# Patient Record
Sex: Female | Born: 1985
Health system: Southern US, Community
[De-identification: ages and names within clinical notes are randomized; demographics above are authoritative.]

## PROBLEM LIST (undated history)

## (undated) ENCOUNTER — Ambulatory Visit (HOSPITAL_COMMUNITY): Payer: Self-pay

## (undated) DIAGNOSIS — F419 Anxiety disorder, unspecified: Secondary | ICD-10-CM

## (undated) DIAGNOSIS — F329 Major depressive disorder, single episode, unspecified: Secondary | ICD-10-CM

## (undated) DIAGNOSIS — F32A Depression, unspecified: Secondary | ICD-10-CM

## (undated) DIAGNOSIS — F209 Schizophrenia, unspecified: Secondary | ICD-10-CM

## (undated) HISTORY — PX: CHOLECYSTECTOMY: SHX55

---
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
## 2005-02-14 ENCOUNTER — Ambulatory Visit (HOSPITAL_COMMUNITY): Admission: RE | Admit: 2005-02-14 | Discharge: 2005-02-14 | Payer: Self-pay | Admitting: Pulmonary Disease

## 2005-04-20 ENCOUNTER — Ambulatory Visit (HOSPITAL_COMMUNITY): Admission: RE | Admit: 2005-04-20 | Discharge: 2005-04-20 | Payer: Self-pay | Admitting: Pulmonary Disease

## 2006-08-13 ENCOUNTER — Emergency Department (HOSPITAL_COMMUNITY): Admission: EM | Admit: 2006-08-13 | Discharge: 2006-08-14 | Payer: Self-pay | Admitting: Emergency Medicine

## 2006-11-17 ENCOUNTER — Emergency Department (HOSPITAL_COMMUNITY): Admission: EM | Admit: 2006-11-17 | Discharge: 2006-11-17 | Payer: Self-pay | Admitting: Emergency Medicine

## 2006-12-05 ENCOUNTER — Inpatient Hospital Stay (HOSPITAL_COMMUNITY): Admission: EM | Admit: 2006-12-05 | Discharge: 2006-12-07 | Payer: Self-pay | Admitting: Emergency Medicine

## 2007-04-02 ENCOUNTER — Ambulatory Visit (HOSPITAL_COMMUNITY): Admission: RE | Admit: 2007-04-02 | Discharge: 2007-04-02 | Payer: Self-pay | Admitting: Pulmonary Disease

## 2007-04-03 ENCOUNTER — Ambulatory Visit (HOSPITAL_COMMUNITY): Admission: RE | Admit: 2007-04-03 | Discharge: 2007-04-03 | Payer: Self-pay | Admitting: Pulmonary Disease

## 2010-10-16 ENCOUNTER — Encounter: Payer: Self-pay | Admitting: Pulmonary Disease
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
## 2015-10-21 ENCOUNTER — Emergency Department (HOSPITAL_COMMUNITY): Payer: Self-pay

## 2015-10-21 ENCOUNTER — Encounter (HOSPITAL_COMMUNITY): Payer: Self-pay | Admitting: Emergency Medicine

## 2015-10-21 ENCOUNTER — Encounter (HOSPITAL_BASED_OUTPATIENT_CLINIC_OR_DEPARTMENT_OTHER): Payer: Self-pay | Admitting: Clinical

## 2015-10-21 ENCOUNTER — Emergency Department (HOSPITAL_COMMUNITY)
Admission: EM | Admit: 2015-10-21 | Discharge: 2015-10-21 | Disposition: A | Discharge status: Home / Self Care | Payer: Self-pay | Attending: Emergency Medicine | Admitting: Emergency Medicine

## 2015-10-21 DIAGNOSIS — R000 Tachycardia, unspecified: Secondary | ICD-10-CM | POA: Insufficient documentation

## 2015-10-21 DIAGNOSIS — S50811A Abrasion of right forearm, initial encounter: Secondary | ICD-10-CM | POA: Insufficient documentation

## 2015-10-21 DIAGNOSIS — Z8659 Personal history of other mental and behavioral disorders: Secondary | ICD-10-CM | POA: Insufficient documentation

## 2015-10-21 DIAGNOSIS — IMO0002 Reserved for concepts with insufficient information to code with codable children: Secondary | ICD-10-CM

## 2015-10-21 DIAGNOSIS — M546 Pain in thoracic spine: Secondary | ICD-10-CM

## 2015-10-21 DIAGNOSIS — Y9289 Other specified places as the place of occurrence of the external cause: Secondary | ICD-10-CM | POA: Insufficient documentation

## 2015-10-21 DIAGNOSIS — Y9389 Activity, other specified: Secondary | ICD-10-CM | POA: Insufficient documentation

## 2015-10-21 DIAGNOSIS — F17200 Nicotine dependence, unspecified, uncomplicated: Secondary | ICD-10-CM | POA: Insufficient documentation

## 2015-10-21 DIAGNOSIS — S29002A Unspecified injury of muscle and tendon of back wall of thorax, initial encounter: Secondary | ICD-10-CM | POA: Insufficient documentation

## 2015-10-21 DIAGNOSIS — Y998 Other external cause status: Secondary | ICD-10-CM | POA: Insufficient documentation

## 2015-10-21 DIAGNOSIS — Z7289 Other problems related to lifestyle: Secondary | ICD-10-CM

## 2015-10-21 DIAGNOSIS — S29001A Unspecified injury of muscle and tendon of front wall of thorax, initial encounter: Secondary | ICD-10-CM | POA: Insufficient documentation

## 2015-10-21 DIAGNOSIS — R0789 Other chest pain: Secondary | ICD-10-CM

## 2015-10-21 DIAGNOSIS — X788XXA Intentional self-harm by other sharp object, initial encounter: Secondary | ICD-10-CM | POA: Insufficient documentation

## 2015-10-21 DIAGNOSIS — S50812A Abrasion of left forearm, initial encounter: Secondary | ICD-10-CM | POA: Insufficient documentation

## 2015-10-21 DIAGNOSIS — R4189 Other symptoms and signs involving cognitive functions and awareness: Secondary | ICD-10-CM

## 2015-10-21 HISTORY — DX: Depression, unspecified: F32.A

## 2015-10-21 HISTORY — DX: Major depressive disorder, single episode, unspecified: F32.9

## 2015-10-21 HISTORY — DX: Anxiety disorder, unspecified: F41.9

## 2015-10-21 LAB — I-STAT TROPONIN, ED
TROPONIN I, POC: 0 ng/mL (ref 0.00–0.08)
TROPONIN I, POC: 0 ng/mL (ref 0.00–0.08)

## 2015-10-21 LAB — CBC
HCT: 43.5 % (ref 36.0–46.0)
Hemoglobin: 15.1 g/dL — ABNORMAL HIGH (ref 12.0–15.0)
MCH: 31.2 pg (ref 26.0–34.0)
MCHC: 34.7 g/dL (ref 30.0–36.0)
MCV: 89.9 fL (ref 78.0–100.0)
PLATELETS: 271 10*3/uL (ref 150–400)
RBC: 4.84 MIL/uL (ref 3.87–5.11)
RDW: 13.4 % (ref 11.5–15.5)
WBC: 11.3 10*3/uL — AB (ref 4.0–10.5)

## 2015-10-21 LAB — BASIC METABOLIC PANEL
Anion gap: 12 (ref 5–15)
BUN: 8 mg/dL (ref 6–20)
CALCIUM: 9.2 mg/dL (ref 8.9–10.3)
CHLORIDE: 102 mmol/L (ref 101–111)
CO2: 23 mmol/L (ref 22–32)
CREATININE: 0.65 mg/dL (ref 0.44–1.00)
Glucose, Bld: 105 mg/dL — ABNORMAL HIGH (ref 65–99)
Potassium: 3.3 mmol/L — ABNORMAL LOW (ref 3.5–5.1)
SODIUM: 137 mmol/L (ref 135–145)

## 2015-10-21 LAB — ACETAMINOPHEN LEVEL

## 2015-10-21 LAB — SALICYLATE LEVEL: Salicylate Lvl: 4.4 mg/dL (ref 2.8–30.0)

## 2015-10-21 LAB — ETHANOL

## 2015-10-21 MED ORDER — IBUPROFEN 600 MG PO TABS: 600.0000 mg | ORAL_TABLET | Freq: Three times a day (TID) | ORAL | Status: DC | PRN

## 2015-10-21 MED ORDER — KETOROLAC TROMETHAMINE 30 MG/ML IJ SOLN
30.0000 mg | Freq: Once | INTRAMUSCULAR | Status: AC
  Administered 2015-10-21: 30 mg via INTRAVENOUS
  Filled 2015-10-21: qty 1

## 2015-10-21 MED ORDER — ACETAMINOPHEN 325 MG PO TABS
650.0000 mg | ORAL_TABLET | ORAL | Status: DC | PRN
  Administered 2015-10-21: 650 mg via ORAL
  Filled 2015-10-21: qty 2

## 2015-10-21 MED FILL — SULFAMETHOXAZOLE/TMP DS TAB: 800-160 | 7 days supply | Qty: 14 | Fill #0

## 2015-10-21 MED FILL — ?FLUOXETINE HCL 20MG TABLET: 20 | 30 days supply | Qty: 30 | Fill #0

## 2015-10-21 MED FILL — BENZTROPINE MES 1 MG TABLET: 1 | 15 days supply | Qty: 30 | Fill #0

## 2015-10-21 MED FILL — IBUPROFEN 600 MG TABLET: 600 | 10 days supply | Qty: 30 | Fill #0

## 2015-10-21 MED FILL — HALOPERIDOL 2 MG TABLET: 2 | 30 days supply | Qty: 30 | Fill #0
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
## 2015-10-21 NOTE — ED Notes (Signed)
Patient with chest pain that started over 2 hours ago.  Patient states that the pain goes into left arm and into her back.  Patient is CAOx4 at this time.  Patient denies any shortness of breath or nausea or vomiting.

## 2015-10-21 NOTE — ED Notes (Signed)
TTS at bedside. 

## 2015-10-21 NOTE — ED Notes (Signed)
PT returned from XR
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
## 2015-10-21 NOTE — ED Notes (Signed)
Patient d/c'd from IV, continuous pulse oximetry and blood pressure cuff; patient getting dressed to be discharged home 

## 2015-10-21 NOTE — Care Management Note (Signed)
Case Management Note  Patient Details  Name: Emily [name] MRN: [id number] Date of Birth: Sep 11, 1986  Subjective/Objective:                  30 year old female presents with left sided back pain that radiates to chest and left upper arm.//Home alone.  Action/Plan: Follow for disposition needs.   Expected Discharge Date:      10/11/15            Expected Discharge Plan:  Home/Self Care  In-House Referral:  NA  Discharge planning Services  CM Consult, Follow-up appt scheduled  Post Acute Care Choice:  NA Choice offered to:  Patient  DME Arranged:  N/A DME Agency:  NA  HH Arranged:  NA HH Agency:  NA  Status of Service:  Completed, signed off  Medicare Important Message Given:    Date Medicare IM Given:    Medicare IM give by:    Date Additional Medicare IM Given:    Additional Medicare Important Message give by:     If discussed at [place] [place] of Stay Meetings, dates discussed:    Additional Comments: Hawkin Charo J. Clydene Laming, RN, BSN, [place] [phone] ED CM consulted regarding PCP establishment and insurance enrollment. Pt presented to [facility] ED today with pain. NCM met with pt at bedside; pt confirms not having access to f/u care with PCP or insurance coverage. Discussed with patient importance and benefits of establishing PCP, and not utilizing the ED for primary care needs. Pt verbalized understanding and is in agreement. Discussed other options, provided list of local  affordable PCPs.  Pt voiced interest in the [facility] and [place].  NCM advised that [facility]  Internal Medicine providers are seeing pts at [facility]. Pt verbalized understanding. NCM set up appointment at [facility] with Pecolia Ades, NP on 2/27 at 0915.  NCM informed pt they may visit [place] and [facility] for Rx needs upon discharge.   Fuller Mandril, RN 10/21/2015, 10:32 AM

## 2015-10-21 NOTE — Progress Notes (Addendum)
ASSESSMENT: Pt currently experiencing impaired cognition of unknown origin. Pt needs to establish care with PCP, f/u with [facility], continue with upcoming BH evaluation/appointment; would benefit from community resources and supportive counseling regarding navigating healthcare and psychosocial stressors.  Stage of Change: precontemplative  PLAN: 1. F/U with behavioral health consultant in as needed  2. Psychiatric Medications: unknown. 3. Behavioral recommendation(s):   -Pick up medications today in [facility] pharmacy -Go to disability appointment on 10-28-15 at 8:30am, [facility], [address] [place], [place], [place] -Go to [facility] tomorrow, 10-22-15, as walk-in for initial intake -Use phone at [facility] to call Asher Muir at [facility] [phone], to let Asher Muir know she is safe -Take food and toiletry resources home today -Upcoming PCP appointment on 11-21-14 at [facility] 9:30am  SUBJECTIVE: Pt. referred by another patient for help navigating medical appointments Pt. reports the following symptoms/concerns: Pt states that she was sent to [facility] today from the ED, and that she is uncertain about upcoming appointments. Pt says she needs food, says "I need help because I'm crazy, had a nervous breakdown yesterday, says she thinks she missed an appointment yesterday for disability, and that she will have transportation later this afternoon, and is in stable housing for the evening, although she is currently homeless, does not have a phone, says she will call tomorrow 10-22-15 to let us know she has made it to the [facility]. Duration of problem: At least one day Severity: moderate  OBJECTIVE: Orientation & Cognition: Oriented x3. Thought processes normal and appropriate to situation. Mood: appropriate. Affect: inappropriate, slowed Appearance: appropriate Risk of harm to self or others: no known risk of harm to self or others today, no SI today Substance use: alcohol, tobacco Assessments  administered: none today  Diagnosis: Impaired Cognition CPT Code: R41.89 -------------------------------------------- Other(s) present in the room: female friend  Time spent with patient in exam room: 20 minutes

## 2015-11-22 ENCOUNTER — Ambulatory Visit: Payer: Self-pay | Admitting: Family Medicine

## 2016-05-27 ENCOUNTER — Emergency Department (HOSPITAL_COMMUNITY)
Admission: EM | Admit: 2016-05-27 | Discharge: 2016-05-27 | Disposition: A | Discharge status: Home / Self Care | Payer: Self-pay | Attending: Emergency Medicine | Admitting: Emergency Medicine

## 2016-05-27 ENCOUNTER — Encounter (HOSPITAL_COMMUNITY): Payer: Self-pay | Admitting: *Deleted

## 2016-05-27 DIAGNOSIS — F17200 Nicotine dependence, unspecified, uncomplicated: Secondary | ICD-10-CM | POA: Insufficient documentation

## 2016-05-27 DIAGNOSIS — Y939 Activity, unspecified: Secondary | ICD-10-CM | POA: Insufficient documentation

## 2016-05-27 DIAGNOSIS — Y999 Unspecified external cause status: Secondary | ICD-10-CM | POA: Insufficient documentation

## 2016-05-27 DIAGNOSIS — Y929 Unspecified place or not applicable: Secondary | ICD-10-CM | POA: Insufficient documentation

## 2016-05-27 DIAGNOSIS — W57XXXA Bitten or stung by nonvenomous insect and other nonvenomous arthropods, initial encounter: Secondary | ICD-10-CM | POA: Insufficient documentation

## 2016-05-27 DIAGNOSIS — S40262A Insect bite (nonvenomous) of left shoulder, initial encounter: Secondary | ICD-10-CM | POA: Insufficient documentation

## 2016-05-27 DIAGNOSIS — Z7982 Long term (current) use of aspirin: Secondary | ICD-10-CM | POA: Insufficient documentation

## 2016-05-27 MED ORDER — NAPROXEN 250 MG PO TABS
500.0000 mg | ORAL_TABLET | Freq: Once | ORAL | Status: AC
  Administered 2016-05-27: 500 mg via ORAL
  Filled 2016-05-27: qty 2

## 2016-05-27 MED ORDER — NAPROXEN 375 MG PO TABS: 375.0000 mg | ORAL_TABLET | Freq: Two times a day (BID) | ORAL | 0 refills | Status: DC

## 2016-05-27 NOTE — ED Triage Notes (Signed)
The pt reports that she was bitten by something on her lt shoulder lasy night  And today she reports thast she has pain in her flank area.  I do not see any bites on her flank .  She also reports that she is sob no sob at present

## 2016-05-27 NOTE — ED Notes (Signed)
Pt refused discharge medications, appeared very anxious to leave.

## 2016-05-27 NOTE — ED Provider Notes (Signed)
[facility]-EMERGENCY DEPT Provider Note   CSN: [id number] Arrival date & time: 05/27/16  2133     History   Chief Complaint Chief Complaint  Patient presents with  . Insect Bite    HPI Emily [name] is a 30 y.o. female.  The patient presents with complaint of pain from multiple insect bites to left shoulder last night. Today she feels a burning sensation without bites to legs bilaterally. No nausea, vomiting, weakness.    The history is provided by the patient. No language interpreter was used.    Past Medical History:  Diagnosis Date  . Anxiety   . Anxiety   . Depression     There are no active problems to display for this patient.   History reviewed. No pertinent surgical history.  OB History    No data available       Home Medications    Prior to Admission medications   Not on File    Family History No family history on file.  Social History Social History  Substance Use Topics  . Smoking status: Current Every Day Smoker  . Smokeless tobacco: Never Used  . Alcohol use Yes     Allergies   Review of patient's allergies indicates no known allergies.   Review of Systems Review of Systems  Constitutional: Negative for activity change, appetite change and diaphoresis.  Respiratory: Negative.  Negative for shortness of breath.   Gastrointestinal: Negative.  Negative for nausea.  Genitourinary: Negative.  Negative for dysuria.  Musculoskeletal:       See HPI.  Skin: Positive for rash.  Neurological: Negative.      Physical Exam Updated Vital Signs BP 127/82   Pulse (!) 127   Temp 98.9 F (37.2 C)   Resp 22   LMP 04/27/2016   SpO2 98%   Physical Exam  Constitutional: She is oriented to person, place, and time. She appears well-developed and well-nourished.  Neck: Normal range of motion.  Pulmonary/Chest: Effort normal.  Musculoskeletal: Normal range of motion. She exhibits no edema or tenderness.  No bites, redness or swelling of LE's.     Neurological: She is alert and oriented to person, place, and time.  Skin: Skin is warm and dry.  Multiple singular raised red areas to posterior left shoulder without pustules, blisters or drainage c/w benign insect bites.      ED Treatments / Results  Labs (all labs ordered are listed, but only abnormal results are displayed) Labs Reviewed - No data to display  EKG  EKG Interpretation None       Radiology No results found.  Procedures Procedures (including critical care time)  Medications Ordered in ED Medications - No data to display   Initial Impression / Assessment and Plan / ED Course  I have reviewed the triage vital signs and the nursing notes.  Pertinent labs & imaging results that were available during my care of the patient were reviewed by me and considered in my medical decision making (see chart for details).  Clinical Course    The patient presents concerned for insect bits to shoulder. She reports psychiatric history, treated with regular medications through Mental health. She denies instability, hallucinations, SI/HI or missed appointments for medications. Will treat insect bites with anti-inflammatory medications and provide referral for PCP.  Final Clinical Impressions(s) / ED Diagnoses   Final diagnoses:  None   1. Insect bites.  New Prescriptions New Prescriptions   No medications on file  Elpidio AnisShari Dyllan Hughett, PA-C 05/27/16 2251    Maia PlanJoshua G Long, MD 05/28/16 [id number]

## 2016-06-28 ENCOUNTER — Inpatient Hospital Stay
Admission: RE | Admit: 2016-06-28 | Discharge: 2016-06-29 | DRG: 897 | Disposition: A | Discharge status: Home / Self Care | Payer: No Typology Code available for payment source | Source: Intra-hospital | Attending: Psychiatry | Admitting: Psychiatry

## 2016-06-28 ENCOUNTER — Encounter: Payer: Self-pay | Admitting: Psychiatry

## 2016-06-28 DIAGNOSIS — F209 Schizophrenia, unspecified: Secondary | ICD-10-CM | POA: Diagnosis present

## 2016-06-28 DIAGNOSIS — R768 Other specified abnormal immunological findings in serum: Secondary | ICD-10-CM

## 2016-06-28 DIAGNOSIS — F1220 Cannabis dependence, uncomplicated: Secondary | ICD-10-CM | POA: Diagnosis present

## 2016-06-28 DIAGNOSIS — F419 Anxiety disorder, unspecified: Secondary | ICD-10-CM | POA: Diagnosis present

## 2016-06-28 DIAGNOSIS — G4700 Insomnia, unspecified: Secondary | ICD-10-CM | POA: Diagnosis present

## 2016-06-28 DIAGNOSIS — F1410 Cocaine abuse, uncomplicated: Secondary | ICD-10-CM | POA: Diagnosis present

## 2016-06-28 DIAGNOSIS — F15959 Other stimulant use, unspecified with stimulant-induced psychotic disorder, unspecified: Principal | ICD-10-CM | POA: Diagnosis present

## 2016-06-28 DIAGNOSIS — F17210 Nicotine dependence, cigarettes, uncomplicated: Secondary | ICD-10-CM | POA: Diagnosis present

## 2016-06-28 DIAGNOSIS — F29 Unspecified psychosis not due to a substance or known physiological condition: Secondary | ICD-10-CM | POA: Diagnosis not present

## 2016-06-28 DIAGNOSIS — Z818 Family history of other mental and behavioral disorders: Secondary | ICD-10-CM | POA: Diagnosis not present

## 2016-06-28 DIAGNOSIS — F1520 Other stimulant dependence, uncomplicated: Secondary | ICD-10-CM

## 2016-06-28 DIAGNOSIS — B1920 Unspecified viral hepatitis C without hepatic coma: Secondary | ICD-10-CM | POA: Diagnosis present

## 2016-06-28 DIAGNOSIS — F17200 Nicotine dependence, unspecified, uncomplicated: Secondary | ICD-10-CM

## 2016-06-28 LAB — RAPID HIV SCREEN (HIV 1/2 AB+AG)
HIV 1/2 Antibodies: NONREACTIVE
HIV-1 P24 ANTIGEN - HIV24: NONREACTIVE

## 2016-06-28 MED ORDER — OLANZAPINE 7.5 MG PO TABS
7.5000 mg | ORAL_TABLET | Freq: Every day | ORAL | Status: DC
Start: 2016-06-28 — End: 2016-06-29
  Administered 2016-06-28: 7.5 mg via ORAL
  Filled 2016-06-28: qty 1

## 2016-06-28 MED ORDER — MAGNESIUM HYDROXIDE 400 MG/5ML PO SUSP: 30.0000 mL | Freq: Every day | ORAL | Status: DC | PRN

## 2016-06-28 MED ORDER — ALUM & MAG HYDROXIDE-SIMETH 200-200-20 MG/5ML PO SUSP: 30.0000 mL | ORAL | Status: DC | PRN

## 2016-06-28 MED ORDER — TRAZODONE HCL 100 MG PO TABS: 100.0000 mg | ORAL_TABLET | Freq: Every evening | ORAL | Status: DC | PRN

## 2016-06-28 MED ORDER — NICOTINE 21 MG/24HR TD PT24
21.0000 mg | MEDICATED_PATCH | Freq: Every day | TRANSDERMAL | Status: DC
  Administered 2016-06-28 – 2016-06-29 (×2): 21 mg via TRANSDERMAL
  Filled 2016-06-28 (×2): qty 1

## 2016-06-28 MED ORDER — ACETAMINOPHEN 325 MG PO TABS
650.0000 mg | ORAL_TABLET | Freq: Four times a day (QID) | ORAL | Status: DC | PRN
  Administered 2016-06-28 – 2016-06-29 (×3): 650 mg via ORAL
  Filled 2016-06-28 (×3): qty 2

## 2016-06-28 MED ORDER — HYDROXYZINE HCL 50 MG PO TABS
50.0000 mg | ORAL_TABLET | Freq: Three times a day (TID) | ORAL | Status: DC | PRN
  Administered 2016-06-29: 50 mg via ORAL
  Filled 2016-06-28: qty 1

## 2016-06-28 NOTE — BH Assessment (Signed)
[facility].  Accepting physician is Dr. Michel SanteeHernadez.  Attending Physician will be Dr. Michel SanteeHernadez.  Patient has been assigned to room 325, by [facility] Charge Nurse OpalJennifer.  Call report to [phone].  Representative/Transfer Coordinator is Jerilynn SomCalvin  [facility] Staff Lillia Abed([place], [facility]) made aware of acceptance.

## 2016-06-28 NOTE — Tx Team (Signed)
Initial Treatment Plan 06/28/2016 2:09 PM Emily FatLaken B Vanwagner UJW:[id number]    PATIENT STRESSORS: Financial difficulties Health problems/   PATIENT STRENGTHS: Communication skills Motivation for treatment/growth Physical Health   PATIENT IDENTIFIED PROBLEMS: Schizophrenia  06/28/16  Substance Abuse  06/28/16  Suicidal 06/28/16  Depression 06/28/16               DISCHARGE CRITERIA:  Ability to meet basic life and health needs Medical problems require only outpatient monitoring Withdrawal symptoms are absent or subacute and managed without 24-hour nursing intervention  PRELIMINARY DISCHARGE PLAN: Outpatient therapy Return to previous living arrangement  PATIENT/FAMILY INVOLVEMENT: This treatment plan has been presented to and reviewed with the patient, Emily [name], and/or family member,  The patient and family have been given the opportunity to ask questions and make suggestions.  Emily InfanteGwen A Danyael Alipio, RN 06/28/2016, 2:09 PM

## 2016-06-28 NOTE — Progress Notes (Signed)
Admission Note:  D: Pt appeared depressed  With  a flat affect.  Pt  denies SI / AVH at this time. Admitted for Auditory Hallucinations. Voice of suicide prior to coming to floor but not at present .  Patient has a history of asthma, reflux  Anxiety patient smokes   Allergic to bee venom.   Pt is redirectable and cooperative with assessment.      A: Pt admitted to unit per protocol, skin assessment and search done and no contraband found.  Pt  educated on therapeutic milieu rules. Pt was introduced to milieu by nursing staff.    R: Pt was receptive to education about the milieu .  15 min safety checks started. Clinical research associatewriter offered support

## 2016-06-28 NOTE — Plan of Care (Signed)
Problem: Activity: Goal: Interest or engagement in activities will improve Outcome: Not Progressing New admission unable to began work on issues  Remains in room

## 2016-06-28 NOTE — BHH Suicide Risk Assessment (Signed)
[facility]   Nursing information obtained from:    Demographic factors:    Current Mental Status:    Loss Factors:    Historical Factors:    Risk Reduction Factors:     Total Time spent with patient: 1 hour Principal Problem: Psychosis Diagnosis:   Patient Active Problem List   Diagnosis Date Noted  . Unspecified schizophrenia spectrum disorder [F29] 06/28/2016  . Tobacco use disorder [F17.200] 06/28/2016  . Amphetamine use disorder, severe (HCC) [F15.20] 06/28/2016  . Cannabis use disorder, severe, dependence (HCC) [F12.20] 06/28/2016   Subjective Data:   Continued Clinical Symptoms:  Alcohol Use Disorder Identification Test Final Score (AUDIT): 3 The "Alcohol Use Disorders Identification Test", Guidelines for Use in Primary Care, Second Edition.  World Science [facility](WHO). Score between 0-7:  no or low risk or alcohol related problems. Score between 8-15:  moderate risk of alcohol related problems. Score between 16-19:  high risk of alcohol related problems. Score 20 or above:  warrants further diagnostic evaluation for alcohol dependence and treatment.   CLINICAL FACTORS:   Alcohol/Substance Abuse/Dependencies More than one psychiatric diagnosis Previous Psychiatric Diagnoses and Treatments   Musculoskeletal:  Psychiatric Specialty Exam: Physical Exam  Constitutional: She is oriented to person, place, and time. She appears well-developed and well-nourished.  HENT:  Head: Normocephalic and atraumatic.  Eyes: Conjunctivae and EOM are normal.  Neck: Normal range of motion.  Respiratory: Effort normal.  Musculoskeletal: Normal range of motion.  Neurological: She is alert and oriented to person, place, and time.    Review of Systems  Constitutional: Negative.   HENT: Negative.   Eyes: Negative.   Respiratory: Negative.   Cardiovascular: Negative.   Gastrointestinal: Negative.   Genitourinary: Negative.   Musculoskeletal:  Negative.   Skin: Negative.   Endo/Heme/Allergies: Negative.   Psychiatric/Behavioral: Positive for hallucinations and substance abuse. Negative for depression, memory loss and suicidal ideas. The patient is not nervous/anxious and does not have insomnia.     Blood pressure 105/63, pulse 67, temperature 98.6 F (37 C), temperature source Oral, resp. rate 18, height 5\' 3"  (1.6 m), weight 70.8 kg (156 lb), SpO2 100 %.Body mass index is 27.63 kg/m.                                                    Sleep:         COGNITIVE FEATURES THAT CONTRIBUTE TO RISK:  Closed-mindedness    SUICIDE RISK:   Mild:  Suicidal ideation of limited frequency, intensity, duration, and specificity.  There are no identifiable plans, no associated intent, mild dysphoria and related symptoms, good self-control (both objective and subjective assessment), few other risk factors, and identifiable protective factors, including available and accessible social support.   [facility]  I certify that inpatient services furnished can reasonably be expected to improve the patient's condition.  Jimmy FootmanHernandez-Gonzalez,  Alfie Alderfer, MD 06/28/2016, 3:21 PM
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
## 2016-06-28 NOTE — [facility] Suicide Risk Assessment (Signed)
[facility] INPATIENT:  Family/Significant Other Suicide Prevention Education  Suicide Prevention Education:  Contact Attempts: with pt's mother Bella KennedyWendy Whitmyer at ph: [phone], (name of family member/significant other) has been identified by the patient as the family member/significant other with whom the patient will be residing, and identified as the person(s) who will aid the patient in the event of a mental health crisis.  With written consent from the patient, two attempts were made to provide suicide prevention education, prior to and/or following the patient's discharge.  We were unsuccessful in providing suicide prevention education.  A suicide education pamphlet was given to the patient to share with family/significant other.  Date and time of first attempt:10/17 at 3:53pm Date and time of second attempt:CSW was notified by pt and pt confirmed that the pt's mother, the pt's designee for family SPE contact had passed away on 06/29/16.  CSW completed SPE with the pt on 01/28/16    Dorothe PeaJonathan F Mearl Olver 06/28/2016, 3:51 PM   Updated: 06/30/16

## 2016-06-29 DIAGNOSIS — R768 Other specified abnormal immunological findings in serum: Secondary | ICD-10-CM

## 2016-06-29 LAB — HEPATITIS C ANTIBODY

## 2016-06-29 MED ORDER — TRAZODONE HCL 100 MG PO TABS: 100.0000 mg | ORAL_TABLET | Freq: Every evening | ORAL | 0 refills | Status: DC | PRN

## 2016-06-29 MED ORDER — OLANZAPINE 7.5 MG PO TABS: 7.5000 mg | ORAL_TABLET | Freq: Every day | ORAL | 0 refills | Status: DC

## 2016-06-29 NOTE — [facility] Group Notes (Signed)
[facility] Group Notes:  (Nursing/MHT/Case Management/Adjunct)  Date:  06/29/2016  Time:  2:53 AM  Type of Therapy:  Group Therapy  Participation Level:  Active  Participation Quality:  Appropriate  Affect:  Appropriate  Cognitive:  Appropriate  Insight:  Appropriate  Engagement in Group:  Engaged  Modes of Intervention:  n/a  Summary of Progress/Problems:  [name] [name] 06/29/2016, 2:53 AM

## 2016-06-29 NOTE — [facility] Suicide Risk Assessment (Signed)
[facility]   Principal Problem: Psychosis Discharge Diagnoses:  Patient Active Problem List   Diagnosis Date Noted  . Hepatitis C antibody positive in blood [R76.8] 06/29/2016  . Unspecified schizophrenia spectrum disorder [F29] 06/28/2016  . Tobacco use disorder [F17.200] 06/28/2016  . Amphetamine use disorder, severe (HCC) [F15.20] 06/28/2016  . Cannabis use disorder, severe, dependence (HCC) [F12.20] 06/28/2016     Psychiatric Specialty Exam: ROS  Blood pressure 109/84, pulse 66, temperature 98.4 F (36.9 C), temperature source Oral, resp. rate 18, height 5\' 3"  (1.6 m), weight 70.8 kg (156 lb), SpO2 100 %.Body mass index is 27.63 kg/m.                                                       Mental Status Per Nursing Assessment::   On Admission:     Demographic Factors:  Caucasian, Low socioeconomic status and Unemployed  Loss Factors: Loss of significant relationship and Financial problems/change in socioeconomic status  Historical Factors: Impulsivity  Risk Reduction Factors:   Living with another person, especially a relative  Continued Clinical Symptoms:  Alcohol/Substance Abuse/Dependencies Previous Psychiatric Diagnoses and Treatments  Cognitive Features That Contribute To Risk:  Closed-mindedness    Suicide Risk:  Minimal: No identifiable suicidal ideation.  Patients presenting with no risk factors but with morbid ruminations; may be classified as minimal risk based on the severity of the depressive symptoms  Follow-up Information    [facility] .   Why:  Please arrive for your hospital follow up for medication management, substance abuse treatment and therapy on Tuesday October [facility]th at [facility] Contact information: [facility] [place] [address],  [place], [place] [address] Phone: [phone] Fax: [phone]           Jimmy FootmanHernandez-Gonzalez,  Indio Santilli, MD 06/29/2016, 4:03 PM

## 2016-06-29 NOTE — [facility] Group Notes (Signed)
[facility] LCSW Group Therapy   06/29/2016 9:30 am   Type of Therapy: Group Therapy   Participation Level: Active   Participation Quality: Attentive, Sharing and Supportive   Affect: Appropriate   Cognitive: Alert and Oriented   Insight: Developing/Improving and Engaged   Engagement in Therapy: Developing/Improving and Engaged   Modes of Intervention: Clarification, Confrontation, Discussion, Education, Exploration, Limit-setting, Orientation, Problem-solving, Rapport Building, Dance movement psychotherapisteality Testing, Socialization and Support   Summary of Progress/Problems: The topic for group was balance in life. Today's group focused on defining balance in one's own words, identifying things that can knock one off balance, and exploring healthy ways to maintain balance in life. Group members were asked to provide an example of a time when they felt off balance, describe how they handled that situation, and process healthier ways to regain balance in the future. Group members were asked to share the most important tool for maintaining balance that [facility] and how they plan to apply this method after discharge.  Pt entered group, sat down and presented as appropriate but smiling broadly.  Pt responded to the CSW's questioning the pt on how the pt was doing by just saying, "I'm okay".  Pt was asked what "feeling balanced" looked like to the pt and the pt replied that "asking for help and asking questions to other people about how to do things" was what being balanced looked like.  Pt share that the pt would "just try to set three goals each day and do those three" in order to feel balanced and sometimes "being anxious" was a barrier to feeling balanced and that this was at times,difficult to do for the pt. Pt was polite and cooperative with the CSW and other group members and focused and attentive to the topics discussed and the sharing of others.     Dorothe PeaJonathan F. Shiva Sahagian, LCSWA, LCAS

## 2016-06-29 NOTE — [facility] Group Notes (Signed)
Goals Group Date/Time: 06/29/2016 9:00 AM Type of Therapy and Topic: Group Therapy: Goals Group: SMART Goals   Participation Level: Moderate  Description of Group:    The purpose of a daily goals group is to assist and guide patients in setting recovery/wellness-related goals. The objective is to set goals as they relate to the crisis in which they were admitted. Patients will be using SMART goal modalities to set measurable goals. Characteristics of realistic goals will be discussed and patients will be assisted in setting and processing how one will reach their goal. Facilitator will also assist patients in applying interventions and coping skills learned in psycho-education groups to the SMART goal and process how one will achieve defined goal.   Therapeutic Goals:   -Patients will develop and document one goal related to or their crisis in which brought them into treatment.  -Patients will be guided by LCSW using SMART goal setting modality in how to set a measurable, attainable, realistic and time sensitive goal.  -Patients will process barriers in reaching goal.  -Patients will process interventions in how to overcome and successful in reaching goal.   Patient's Goal: Pt shared the pt tries to"stay focused" in order to achieve the pt's primary goal of maintaining her home. Pt shared that maintaining the pt's concentration for long periods of time ws a barrier in achieving the pt's goal.  Pt was polite and cooperative with the CSW and other group members and focused and attentive to the topics discussed and the sharing of others.   Therapeutic Modalities:  Motivational Interviewing  Research officer, political partyCognitive Behavioral Therapy  Crisis Intervention Model  SMART goals setting   Dorothe PeaJonathan F. Rocky Rishel, LCSWA, LCAS

## 2016-06-29 NOTE — Progress Notes (Signed)
Patient discharged home. DC instructions provided and explained. Medications reviewed. Rx given. 7 day supply given. Belongings returned. All questions answered. Pt stable at discharge. Denies SI, HI, aVH.

## 2016-06-29 NOTE — [facility] Group Notes (Signed)
[facility] Group Notes:  (Nursing/MHT/Case Management/Adjunct)  Date:  06/29/2016  Time:  4:07 PM  Type of Therapy:  Psychoeducational Skills   Participation Level:  Active  Participation Quality:  Appropriate, Sharing and Supportive  Affect:  Appropriate  Cognitive:  Appropriate  Insight:  Appropriate  Engagement in Group:  Engaged and Supportive  Modes of Intervention:  Discussion, Education and Support  Summary of Progress/Problems:  Noelle PennerKristen J Arty Lantzy 06/29/2016, 4:07 PM

## 2016-06-29 NOTE — Plan of Care (Signed)
Problem: Safety: Goal: Periods of time without injury will increase Outcome: Progressing No PRN given, 15 minute checks maintained for safety, clinical and moral support provided, patient encouraged to continue to express feelings and demonstrate safe care. Patient remains free from harm, will continue to monitor.
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
## 2016-06-29 NOTE — BH Assessment (Signed)
Assessment Note  Emily [name] is an 30 y.o. female who is a direct admit from [facility]. Per the information on the referral, the patient had three ER visits within a 24 hour time period. The two initial visits were for medical reasons. It was discovered she didn't have any medical problems. With her third ER visit, behavioral medicine was consulted. During their assessment, it was discovered she has history of mental illness and is under the outpatient care of [facility] in [place], [place]. It is unclear whether or not she is taking her psychotropic medications as prescribed. She endorsed AV/H, alone with tactile hallucinations. She told the ER staff, she had pain on both sides of her body and it was due to coming out of her.   During the interview with the staff at [facility], her thoughts were disorganized and speech was pressured. She was having flight of ideas and thought blocking. At one point she voiced SI but later denied it. It was reported she was pleasant during the time of the interview and there were no record of her being aggressive or violent.  The information for this assessment was provided by referral from [facility]   Past Medical History:  Past Medical History:  Diagnosis Date  . Anxiety   . Anxiety   . Depression     History reviewed. No pertinent surgical history.  Family History: History reviewed. No pertinent family history.  Social History:  reports that she has been smoking.  She has never used smokeless tobacco. She reports that she drinks alcohol. She reports that she does not use drugs.  Additional Social History:  Alcohol / Drug Use Pain Medications: See PTA Prescriptions: See PTA Over the Counter: See PTA History of alcohol / drug use?: Yes (Based on referral information, it is unclear what substance she uses.) Longest period of sobriety (when/how long): Unknown Negative Consequences of Use:  (None Reported) Withdrawal  Symptoms:  (None Reported)  CIWA: CIWA-Ar BP: 109/84 Pulse Rate: 66 COWS:    Allergies:  Allergies  Allergen Reactions  . Bee Venom     Home Medications:  Medications Prior to Admission  Medication Sig Dispense Refill  . ARIPiprazole (ABILIFY) 2 MG tablet Take 2-5 mg by mouth daily.    Marland Kitchen aspirin EC 81 MG tablet Take 81 mg by mouth daily.    . Multiple Vitamin (MULTIVITAMIN WITH MINERALS) TABS tablet Take 1 tablet by mouth daily.    . naproxen (NAPROSYN) 375 MG tablet Take 1 tablet (375 mg total) by mouth 2 (two) times daily. 20 tablet 0    OB/GYN Status:  No LMP recorded.  General Assessment Data Location of Assessment: [facility] TTS Assessment: Out of system Is this a Tele or Face-to-Face Assessment?: Tele Assessment (Direct Admit from [facility]) Is this an Initial Assessment or a Re-assessment for this encounter?: Initial Assessment Marital status: Single Maiden name: n/a Is patient pregnant?: No Living Arrangements: Alone Can pt return to current living arrangement?: Yes Admission Status: Voluntary Is patient capable of signing voluntary admission?: Yes Referral Source: Self/Family/Friend Insurance type: NOne  Medical Screening Exam ([facility] Walk-in ONLY) Medical Exam completed: Yes  Crisis Care Plan Living Arrangements: Alone Legal Guardian: Other: (Reports of none) Name of Psychiatrist: [facility] Name of Therapist: [facility]  Education Status Is patient currently in school?: No Current Grade: n/a Highest grade of school patient has completed: 12th grade Name of school: n/a Contact person: n/a  Risk to self with the past 6 months Suicidal Ideation: No Has patient been a risk to self within the past 6 months prior to admission? : No Suicidal Intent: No Has patient had any suicidal intent within the past 6 months prior to admission? : No Is patient at risk for suicide?: No Suicidal Plan?:  No Has patient had any suicidal plan within the past 6 months prior to admission? : No Access to Means: No What has been your use of drugs/alcohol within the last 12 months?: Based on referral information, it is unclear what substance she uses. Previous Attempts/Gestures: Yes How many times?: 1 Other Self Harm Risks: None Reported Triggers for Past Attempts: Other (Comment), Other personal contacts, Hallucinations (Noncompliant with psychotropic medications) Intentional Self Injurious Behavior: Cutting Comment - Self Injurious Behavior: History of cutting Family Suicide History: Unknown Recent stressful life event(s): Other (Comment) (Noncompliant with psychotropic medications) Persecutory voices/beliefs?: Yes Depression: Yes Depression Symptoms: Tearfulness, Isolating, Fatigue, Guilt, Loss of interest in usual pleasures, Feeling worthless/self pity Substance abuse history and/or treatment for substance abuse?: Yes Suicide prevention information given to non-admitted patients: Not applicable  Risk to Others within the past 6 months Homicidal Ideation: No Does patient have any lifetime risk of violence toward others beyond the six months prior to admission? : No Thoughts of Harm to Others: No Current Homicidal Intent: No Current Homicidal Plan: No Access to Homicidal Means: No Identified Victim: Reports of none History of harm to others?: No Assessment of Violence: None Noted Violent Behavior Description: Reports of none Does patient have access to weapons?: No Criminal Charges Pending?: No Does patient have a court date: No Is patient on probation?: No  Psychosis Hallucinations: Auditory, Visual, Tactile Delusions: Persecutory  Mental Status Report Appearance/Hygiene: In scrubs, Unremarkable Eye Contact: Good Motor Activity: Freedom of movement, Unremarkable Speech: Slurred, Soft Level of Consciousness: Alert, Restless Mood: Anxious, Helpless, Sad, Pleasant Affect:  Appropriate to circumstance, Depressed, Sad, Anxious Anxiety Level: Minimal Thought Processes: Flight of Ideas, Coherent Judgement: Partial Orientation: Person, Place, Situation Obsessive Compulsive Thoughts/Behaviors: Minimal  Cognitive Functioning Concentration: Decreased Memory: Remote Intact, Recent Impaired IQ: Average Insight: Poor Impulse Control: Poor Appetite: Fair Weight Loss: 0 Weight Gain: 0 Sleep: Decreased Total Hours of Sleep: 4 Vegetative Symptoms: None  ADLScreening [facility] Assessment Services) Patient's cognitive ability adequate to safely complete daily activities?: Yes Patient able to express need for assistance with ADLs?: Yes Independently performs ADLs?: Yes (appropriate for developmental age)  Prior Inpatient Therapy Prior Inpatient Therapy: Yes Prior Therapy Dates: Unknown, patient was unable to remember [facility] Reason for Treatment: Schizophrenia  Prior Outpatient Therapy Prior Outpatient Therapy: Yes Prior Therapy Dates: Current Prior Therapy Facilty/Provider(s): [facility] Reason for Treatment: Schizophrenia  Does patient have an ACCT team?: No Does patient have Intensive In-House Services?  : No Does patient have [facility] services? : No Does patient have P4CC services?: No  ADL Screening (condition at time of admission) Patient's cognitive ability adequate to safely complete daily activities?: Yes Is the patient deaf or have difficulty hearing?: No Does the patient have difficulty seeing, even when wearing glasses/contacts?: No Does the patient have difficulty concentrating, remembering, or making decisions?: No Patient able to express need for assistance with ADLs?: Yes Does the patient have difficulty dressing or bathing?: No Independently performs ADLs?: Yes (appropriate for developmental age) Does the patient have difficulty walking or climbing stairs?: No Weakness of Legs:  None Weakness of Arms/Hands: None  Home Assistive Devices/Equipment Home  Assistive Devices/Equipment: None  Therapy Consults (therapy consults require a physician order) PT Evaluation Needed: No OT Evalulation Needed: No SLP Evaluation Needed: No Abuse/Neglect Assessment (Assessment to be complete while patient is alone) Physical Abuse: Denies Verbal Abuse: Denies Sexual Abuse: Denies Exploitation of patient/patient's resources: Denies Self-Neglect: Denies Possible abuse reported to::  (NA) Values / Beliefs Cultural Requests During Hospitalization: None Spiritual Requests During Hospitalization: None Consults Spiritual Care Consult Needed: No Social Work Consult Needed: No Merchant navy officer (For Healthcare) Does patient have an advance directive?: No Would patient like information on creating an advanced directive?: No - patient declined information Nutrition Screen- [facility] Adult/WL/AP Patient's home diet: Regular Has the patient recently lost weight without trying?: No Has the patient been eating poorly because of a decreased appetite?: No Malnutrition Screening Tool Score: 0  Additional Information 1:1 In Past 12 Months?: No CIRT Risk: No Elopement Risk: No Does patient have medical clearance?: Yes  Child/Adolescent Assessment Running Away Risk: Denies (Patient is an adult)  Disposition:  Disposition Initial Assessment Completed for this Encounter: Yes Disposition of Patient: Inpatient treatment program Type of inpatient treatment program: Adult  On Site Evaluation by:   Reviewed with Physician:    Lilyan Gilford MS, LCAS, LPC, NCC, CCSI Therapeutic Triage Specialist 06/29/2016 10:35 AM

## 2016-06-29 NOTE — Progress Notes (Signed)
A&Ox3, denied pain, expressed relief of HA on re-assessment, pleasant, denied SI/HI, denied AV/H, visible in the milieu and attended the Wrap Up group with active participation; no abnormal behaviors during this shift.

## 2016-06-29 NOTE — Progress Notes (Addendum)
Patient ID: Emily [name], female   DOB: 10/18/1985, 30 y.o.   MRN: [id number] CSW was informed by [facility] Hassan Rowan Core that the pt's friend [name] at ph: [phone] has passed away and that the pt will need to be informed.  CSW called the Chaplin's office and Suezanne Jacquet the chaplain arrived at the unit.  CSW and the chaplain informed the charge nurse [name] of the situation.  Charge nurse [name] informed Dr. Jerilee Hoh the pt's psychiatrist by phone and informed Dr.Hernandez.  CSW met Dr. Jerilee Hoh in person and consulted with dr. Jerilee Hoh about best possible scenario about informing the pt.  Dr. Jerilee Hoh agreed that the CSW informing the pt in the presence of the charge nurse and the chaplain the news of the pt's death.  CSW informed the pt saying, "I've got bad news for you, we were just informed by your friend [name] that your mother has passed away".  We haven't confirmed this but you are welcome to call [name] and confirm this news.  Pt called and confirmed the information that her mother had died.  CSW informed the pt of her option to talk to the chaplain in private or later if necessary. Pt asked to speak to Dr. Jerilee Hoh about possible discharge plans.  CSW informed the pt  She can ask for the CSW if she needed to speak to the CSW.                                                                                       [name], LCSWA, LCAS

## 2016-06-29 NOTE — Progress Notes (Signed)
[facility] MD Progress Note  06/29/2016 3:47 PM Emily [name]  MRN:  [id number] Subjective:  The patient is a 30 year old single Caucasian female who was referred by [facility] emergency department to our unit due to psychosis and hallucinations.  Patient presented there to the ER multiple times (3) on September 30. The patient reported she was experiencing a mental breakdown. She was described as having disorganized thought processes and psychomotor agitation. Her speech was slurred and she was a poor historian.  She was seen agitated at times and responding to internal stimuli.  Urine toxicology is positive for amphetamines, methamphetamines and cannabis. Head CT neg  Yesterday the patient was started on olanzapine 7.5 mg by mouth daily at bedtime. The patient tolerated the medication well and said he was beneficial. She feels much calmer and is no longer having hallucinations. Patient stated that she is feeling better mood-wise, she denies major problem with his sleep, appetite, energy or concentration. States that she slept very well last night. She denies any suicidality or homicidality. He still is planning on returning to live with her boyfriend after discharge and [facility]   Later in the afternoon I was contacted by the patient's nurse to be informed of the patient's mother had passed away. This information will be communicated to the patient later on today   Per nursing: A&Ox3, denied pain, expressed relief of HA on re-assessment, pleasant, denied SI/HI, denied AV/H, visible in the milieu and attended the Wrap Up group with active participation; no abnormal behaviors during this shift.  Principal Problem: Psychosis Diagnosis:   Patient Active Problem List   Diagnosis Date Noted  . Hepatitis C antibody positive in blood [R76.8] 06/29/2016  . Unspecified schizophrenia spectrum disorder [F29] 06/28/2016  . Tobacco use disorder [F17.200] 06/28/2016  . Amphetamine use  disorder, severe (HCC) [F15.20] 06/28/2016  . Cannabis use disorder, severe, dependence (HCC) [F12.20] 06/28/2016   Total Time spent with patient: 30 minutes  Past Psychiatric History: Patient reports being hospitalized twice before. Believes her last hospitalization was about a year ago. She says she also has been admitted at [facility] in the past. She was very vague about dates. [facility] but says she has not been following up because they were supposed to call her back in contact her to coordinate appointments and they didn't.  Patient says that she has been off her medications for about a month she cannot remember what medications damar was prescribing her with  Patient denies any suicidal attempts that she cut herself one time but he was more to do what is stressed him to actually harm herself  Past Medical History:  Past Medical History:  Diagnosis Date  . Anxiety   . Anxiety   . Depression    History reviewed. No pertinent surgical history.  Family History: History reviewed. No pertinent family history.  Family Psychiatric  History:Reports having multiple relatives with bipolar, schizophrenia, substance abuse and personality disorders. She is not aware of any suicides  Social History: Patient lives in [place] with her fianc of 4 years. Patient has not worked in about 8 years. 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 She is now awaiting for disability.  Patient has 2 daughters ages 44 and 70. She has no custody of them as they were removed by social services. They live with their father's mother.  Patient is states that she was raised by  her parents. She has one brother and one sister. Patient has no contact with her parents or her siblings.  As far as her education she graduated 12 and was receiving special help. Patient says she has great difficulty with reading and writing. History  Alcohol Use  . Yes     History   Drug Use No    Social History   Social History  . Marital status: Single    Spouse name: N/A  . Number of children: N/A  . Years of education: N/A   Social History Main Topics  . Smoking status: Current Every Day Smoker  . Smokeless tobacco: Never Used  . Alcohol use Yes  . Drug use: No  . Sexual activity: Not Asked   Other Topics Concern  . None   Social History Narrative  . None   Additional Social History:    Pain Medications: See PTA Prescriptions: See PTA Over the Counter: See PTA History of alcohol / drug use?: Yes (Based on referral information, it is unclear what substance she uses.) Longest period of sobriety (when/how long): Unknown Negative Consequences of Use:  (None Reported) Withdrawal Symptoms:  (None Reported)     Current Medications: Current Facility-Administered Medications  Medication Dose Route Frequency Provider Last Rate Last Dose  . acetaminophen (TYLENOL) tablet 650 mg  650 mg Oral Q6H PRN Jimmy Footman, MD   650 mg at 06/29/16 0853  . alum & mag hydroxide-simeth (MAALOX/MYLANTA) 200-200-20 MG/5ML suspension 30 mL  30 mL Oral Q4H PRN Jimmy Footman, MD      . hydrOXYzine (ATARAX/VISTARIL) tablet 50 mg  50 mg Oral TID PRN Jimmy Footman, MD   50 mg at 06/29/16 0854  . magnesium hydroxide (MILK OF MAGNESIA) suspension 30 mL  30 mL Oral Daily PRN Jimmy Footman, MD      . nicotine (NICODERM CQ - dosed in mg/24 hours) patch 21 mg  21 mg Transdermal Daily Jimmy Footman, MD   21 mg at 06/29/16 0852  . OLANZapine (ZYPREXA) tablet 7.5 mg  7.5 mg Oral QHS Jimmy Footman, MD   7.5 mg at 06/28/16 2114  . traZODone (DESYREL) tablet 100 mg  100 mg Oral QHS PRN Jimmy Footman, MD        Lab Results:  Results for orders placed or performed during the hospital encounter of 06/28/16 (from the past 48 hour(s))  Rapid HIV screen (HIV 1/2 Ab+Ag)     Status: None   Collection Time:  06/28/16  4:44 PM  Result Value Ref Range   HIV-1 P24 Antigen - HIV24 NON REACTIVE NON REACTIVE   HIV 1/2 Antibodies NON REACTIVE NON REACTIVE   Interpretation (HIV Ag Ab)      A non reactive test result means that HIV 1 or HIV 2 antibodies and HIV 1 p24 antigen were not detected in the specimen.  Hepatitis C antibody     Status: Abnormal   Collection Time: 06/28/16  4:44 PM  Result Value Ref Range   HCV Ab >11.0 (H) 0.0 - 0.9 s/co ratio    Comment: (NOTE)                                  Negative:     < 0.8                             Indeterminate: 0.8 - 0.9  Positive:     > 0.9 The CDC recommends that a positive HCV antibody result be followed up with a HCV Nucleic Acid Amplification test ([id number]). Performed At: [facility] [address] [place], [place] [id number] Emily HomerHancock William F MD MV:[phone]:[phone]     Blood Alcohol level:  Lab Results  Component Value Date   [facility] <5 10/21/2015    Metabolic Disorder Labs: No results found for: HGBA1C, MPG No results found for: PROLACTIN No results found for: CHOL, TRIG, HDL, CHOLHDL, VLDL, LDLCALC  Physical Findings: AIMS:  , ,  ,  ,    CIWA:    COWS:     Musculoskeletal: Strength & Muscle Tone: within normal limits Gait & Station: normal Patient leans: N/A  Psychiatric Specialty Exam: Physical Exam  Constitutional: She is oriented to person, place, and time. She appears well-developed and well-nourished.  HENT:  Head: Normocephalic and atraumatic.  Eyes: Conjunctivae and EOM are normal.  Neck: Normal range of motion.  Respiratory: Effort normal.  Musculoskeletal: Normal range of motion.  Neurological: She is alert and oriented to person, place, and time.    Review of Systems  Constitutional: Negative.   HENT: Negative.   Eyes: Negative.   Respiratory: Negative.   Cardiovascular: Negative.   Gastrointestinal: Negative.   Genitourinary: Negative.   Musculoskeletal: Negative.    Skin: Negative.   Neurological: Negative.   Endo/Heme/Allergies: Negative.   Psychiatric/Behavioral: Positive for hallucinations and substance abuse.    Blood pressure 109/84, pulse 66, temperature 98.4 F (36.9 C), temperature source Oral, resp. rate 18, height 5\' 3"  (1.6 m), weight 70.8 kg (156 lb), SpO2 100 %.Body mass index is 27.63 kg/m.  General Appearance: Fairly Groomed  Eye Contact:  Good  Speech:  Clear and Coherent  Volume:  Normal  Mood:  Euthymic  Affect:  Appropriate  Thought Process:  Linear and Descriptions of Associations: Intact  Orientation:  Full (Time, Place, and Person)  Thought Content:  Hallucinations: None  Suicidal Thoughts:  No  Homicidal Thoughts:  No  Memory:  Immediate;   Good Recent;   Good Remote;   Good  Judgement:  Fair  Insight:  Shallow  Psychomotor Activity:  Normal  Concentration:  Concentration: Fair and Attention Span: Fair  Recall:  Good  Fund of Knowledge:  Fair  Language:  Fair  Akathisia:  No  Handed:    AIMS (if indicated):     Assets:  Manufacturing systems engineerCommunication Skills Housing Physical Health  ADL's:  Intact  Cognition:  WNL  Sleep:  Number of Hours: 7.15     Treatment Plan Summary:  Methamphetamine-induced psychosis: Patient has been started on olanzapine 7.5 mg by mouth daily at bedtime  For insomnia: continue  trazodone 100 mg by mouth daily at bedtime when necessary  For tobacco use disorder: continue  nicotine patch 21 mg a day  Opiates, methamphetamines, cocaine and cannabis use disorder severe: Patient is not interested in inpatient substance abuse treatment. She [facility]. Does not seem to have insight into the severity of her addiction  Diet regular  Precautions every 15 minute checks  Vital signs daily  Hospitalization status voluntary  Discharge disposition she'll return to her home in [place]  Follow-up she'll [facility]  [facility]. The patient has CBC, comprehensive metabolic panel, UA, urine toxicology a head CT. Urine pregnancy is negative  As patient has history of opiate addiction HIV (-)  and Hep C (+) were checked    Jimmy Footman, MD 06/29/2016, 3:47 PM

## 2016-06-30 NOTE — Progress Notes (Signed)
  [facility] Discharge Plan :  Will you be returning to the same living situation after discharge:  Yes,  pt will be returning to [place] At discharge, do you have transportation home?: Yes,  pt will be picked up by her friend Jacky KindleStacey Hazelwood Do you have the ability to pay for your medications: Yes,  pt will be provided with prescriptions at discharge  Release of information consent forms completed and in the chart;  Patient's signature needed at discharge.  Patient to Follow up at: Follow-up Information    [facility]  .   Why:  Please arrive for your hospital follow up for medication management, substance abuse treatment and therapy on Tuesday October [facility]th [facility] Contact information: [facility]  [address],  [place], [place] [address] Phone: [phone]) [phone] Fax: [phone]          Next level of care provider has access to [facility] Link:no  Safety Planning and Suicide Prevention discussed: Yes,  completed with pt  Have you used any form of tobacco in the last 30 days? (Cigarettes, Smokeless Tobacco, Cigars, and/or Pipes): Yes  Has patient been referred to the Quitline?: Patient refused referral  Patient has been referred for addiction treatment: Yes  Dorothe PeaJonathan F Loranzo Desha 06/30/2016, 9:12 AM

## 2016-06-30 NOTE — Tx Team (Signed)
Pt was admitted after the previous tx team mtg that the CSW was present for (on 06/28/16) and before the next scheduled tx meeting (on 06/30/16) and as such, a tx team note was not needed.    Dorothe PeaJonathan F. Tyne Banta, LCSWA, LCAS  06/30/16

## 2020-03-22 DIAGNOSIS — R4182 Altered mental status, unspecified: Secondary | ICD-10-CM

## 2020-03-22 DIAGNOSIS — N390 Urinary tract infection, site not specified: Secondary | ICD-10-CM

## 2020-03-22 DIAGNOSIS — F1910 Other psychoactive substance abuse, uncomplicated: Secondary | ICD-10-CM | POA: Diagnosis not present

## 2020-03-23 DIAGNOSIS — R4182 Altered mental status, unspecified: Secondary | ICD-10-CM | POA: Diagnosis not present

## 2020-03-23 DIAGNOSIS — N390 Urinary tract infection, site not specified: Secondary | ICD-10-CM | POA: Diagnosis not present

## 2020-03-23 DIAGNOSIS — F1910 Other psychoactive substance abuse, uncomplicated: Secondary | ICD-10-CM | POA: Diagnosis not present

## 2020-03-24 DIAGNOSIS — R4182 Altered mental status, unspecified: Secondary | ICD-10-CM | POA: Diagnosis not present

## 2020-03-24 DIAGNOSIS — N390 Urinary tract infection, site not specified: Secondary | ICD-10-CM | POA: Diagnosis not present

## 2020-03-24 DIAGNOSIS — F1910 Other psychoactive substance abuse, uncomplicated: Secondary | ICD-10-CM | POA: Diagnosis not present

## 2021-04-01 ENCOUNTER — Other Ambulatory Visit: Payer: Self-pay | Admitting: Psychiatry

## 2021-04-02 ENCOUNTER — Encounter (HOSPITAL_COMMUNITY): Payer: Self-pay | Admitting: Psychiatry

## 2021-04-02 ENCOUNTER — Inpatient Hospital Stay (HOSPITAL_COMMUNITY)
Admission: AD | Admit: 2021-04-02 | Discharge: 2021-04-11 | DRG: 897 | Disposition: A | Discharge status: Home / Self Care | Payer: Federal, State, Local not specified - Other | Source: Other Acute Inpatient Hospital | Attending: Behavioral Health | Admitting: Behavioral Health

## 2021-04-02 ENCOUNTER — Other Ambulatory Visit: Payer: Self-pay

## 2021-04-02 DIAGNOSIS — B1920 Unspecified viral hepatitis C without hepatic coma: Secondary | ICD-10-CM | POA: Diagnosis present

## 2021-04-02 DIAGNOSIS — F1110 Opioid abuse, uncomplicated: Secondary | ICD-10-CM | POA: Diagnosis present

## 2021-04-02 DIAGNOSIS — R45851 Suicidal ideations: Secondary | ICD-10-CM | POA: Diagnosis present

## 2021-04-02 DIAGNOSIS — M549 Dorsalgia, unspecified: Secondary | ICD-10-CM | POA: Diagnosis present

## 2021-04-02 DIAGNOSIS — F1910 Other psychoactive substance abuse, uncomplicated: Secondary | ICD-10-CM | POA: Diagnosis present

## 2021-04-02 DIAGNOSIS — G629 Polyneuropathy, unspecified: Secondary | ICD-10-CM | POA: Diagnosis present

## 2021-04-02 DIAGNOSIS — F17210 Nicotine dependence, cigarettes, uncomplicated: Secondary | ICD-10-CM | POA: Diagnosis present

## 2021-04-02 DIAGNOSIS — F1010 Alcohol abuse, uncomplicated: Secondary | ICD-10-CM | POA: Diagnosis present

## 2021-04-02 DIAGNOSIS — G8929 Other chronic pain: Secondary | ICD-10-CM | POA: Diagnosis present

## 2021-04-02 DIAGNOSIS — L84 Corns and callosities: Secondary | ICD-10-CM | POA: Diagnosis present

## 2021-04-02 DIAGNOSIS — R4182 Altered mental status, unspecified: Secondary | ICD-10-CM | POA: Diagnosis present

## 2021-04-02 DIAGNOSIS — F1514 Other stimulant abuse with stimulant-induced mood disorder: Secondary | ICD-10-CM | POA: Diagnosis present

## 2021-04-02 DIAGNOSIS — Z9152 Personal history of nonsuicidal self-harm: Secondary | ICD-10-CM

## 2021-04-02 DIAGNOSIS — F419 Anxiety disorder, unspecified: Secondary | ICD-10-CM | POA: Diagnosis present

## 2021-04-02 DIAGNOSIS — G4700 Insomnia, unspecified: Secondary | ICD-10-CM | POA: Diagnosis present

## 2021-04-02 DIAGNOSIS — F1520 Other stimulant dependence, uncomplicated: Secondary | ICD-10-CM | POA: Diagnosis not present

## 2021-04-02 DIAGNOSIS — F32A Depression, unspecified: Secondary | ICD-10-CM | POA: Diagnosis not present

## 2021-04-02 DIAGNOSIS — F29 Unspecified psychosis not due to a substance or known physiological condition: Secondary | ICD-10-CM | POA: Diagnosis not present

## 2021-04-02 DIAGNOSIS — R768 Other specified abnormal immunological findings in serum: Secondary | ICD-10-CM | POA: Diagnosis present

## 2021-04-02 LAB — HEMOGLOBIN A1C
Hgb A1c MFr Bld: 4.5 % — ABNORMAL LOW (ref 4.8–5.6)
Mean Plasma Glucose: 82.45 mg/dL

## 2021-04-02 LAB — TSH: TSH: 0.221 u[IU]/mL — ABNORMAL LOW (ref 0.350–4.500)

## 2021-04-02 MED ORDER — TRAZODONE HCL 50 MG PO TABS
50.0000 mg | ORAL_TABLET | Freq: Every evening | ORAL | Status: DC | PRN
  Administered 2021-04-03 – 2021-04-10 (×7): 50 mg via ORAL
  Filled 2021-04-02 (×9): qty 1

## 2021-04-02 MED ORDER — ENSURE ENLIVE PO LIQD
237.0000 mL | Freq: Two times a day (BID) | ORAL | Status: DC
  Administered 2021-04-02 – 2021-04-11 (×10): 237 mL via ORAL
  Filled 2021-04-02 (×22): qty 237

## 2021-04-02 MED ORDER — SERTRALINE HCL 25 MG PO TABS
25.0000 mg | ORAL_TABLET | Freq: Every day | ORAL | Status: DC
  Administered 2021-04-02 – 2021-04-04 (×3): 25 mg via ORAL
  Filled 2021-04-02 (×6): qty 1

## 2021-04-02 MED ORDER — HYDROXYZINE HCL 25 MG PO TABS
25.0000 mg | ORAL_TABLET | ORAL | Status: DC | PRN
  Administered 2021-04-03 (×2): 25 mg via ORAL
  Filled 2021-04-02 (×2): qty 1

## 2021-04-02 MED ORDER — ARIPIPRAZOLE 5 MG PO TABS
5.0000 mg | ORAL_TABLET | Freq: Every day | ORAL | Status: DC
  Administered 2021-04-02 – 2021-04-04 (×3): 5 mg via ORAL
  Filled 2021-04-02 (×6): qty 1

## 2021-04-02 NOTE — Progress Notes (Signed)
Emily [name] is a 35 year old female being admitted voluntarily to [facility] from [facility] ED.  She presented to the ED for suicidal ideation without a plan, depression and anxiety.  She also has history substance abuse, schizophrenia and bipolar disorder.  During [facility] admission, she was very drowsy and minimally participated in the admission process.  She denies SI at this time and will seek out staff if that changes.  She does admit to auditory hallucinations that come and go.  She reported that her stressor is homelessness.  Oriented her to the unit.  Admission paperwork completed and signed.  Belongings searched and secured in locker # 44.  No contraband found.  Skin search completed and noted 3 cuts on left forearm, right forearm scratches and tattoos right upper back and lower back.  Suicide safety plan reviewed, given to patient to compete and return to her nurse.  Q 15 minute checks initiated for safety.  We will monitor the progress towards her goals.

## 2021-04-02 NOTE — Progress Notes (Signed)
EKG results placed on the outside of pt's shadow chart  Normal sinus rhythm Normal ECG  QT/ Qtc-   448/450 ms

## 2021-04-02 NOTE — [facility] Group Notes (Signed)
[facility] Group Notes:  (Nursing/MHT/Case Management/Adjunct)  Date:  04/02/2021  Time:  4:11 PM  Type of Therapy:  Nurse Education  Participation Level:  Did Not Attend  Participation Quality:    Affect:    Cognitive:    Insight:    Engagement in Group:    Modes of Intervention:  Discussion  Summary of Progress/Problems:  Did not attend despite staff invitation.  Kare Dado V Steffi Noviello 04/02/2021, 4:11 PM 

## 2021-04-02 NOTE — Progress Notes (Signed)
     04/02/21 2030  Psych Admission Type (Psych Patients Only)  Admission Status Voluntary  Psychosocial Assessment  Patient Complaints Anergic;Anxiety;Depression  Eye Contact Brief  Facial Expression Blank  Affect Flat  Speech Soft  Interaction Minimal  Motor Activity Slow  Appearance/Hygiene Body odor;Disheveled  Behavior Characteristics Cooperative;Calm  Mood Depressed  Thought Process  Coherency WDL  Content WDL  Delusions WDL  Perception Hallucinations  Hallucination None reported or observed  Judgment Impaired  Confusion WDL  Danger to Self  Current suicidal ideation? Denies  Danger to Others  Danger to Others None reported or observed

## 2021-04-02 NOTE — Tx Team (Signed)
Initial Treatment Plan 04/02/2021 3:08 PM [name] [id number]:[id number]    PATIENT STRESSORS: Health problems Medication change or noncompliance Substance abuse Other: Homeless   PATIENT STRENGTHS: General fund of knowledge Other: Ability to seek out services when needed   PATIENT IDENTIFIED PROBLEMS: Depression  Suicidal ideation  Psychosis      "Get better"           DISCHARGE CRITERIA:  Improved stabilization in mood, thinking, and/or behavior Need for constant or close observation no longer present Reduction of life-threatening or endangering symptoms to within safe limits Verbal commitment to aftercare and medication compliance  PRELIMINARY DISCHARGE PLAN: Outpatient therapy Medication management  PATIENT/FAMILY INVOLVEMENT: This treatment plan has been presented to and reviewed with the patient, Emily [name].  The patient and family have been given the opportunity to ask questions and make suggestions.  Levin Bacon, RN 04/02/2021, 3:08 PM

## 2021-04-02 NOTE — Plan of Care (Signed)
  Problem: Education: Goal: Knowledge of the prescribed therapeutic regimen will improve Outcome: Not Progressing   Problem: Activity: Goal: Interest or engagement in leisure activities will improve Outcome: Not Progressing   Problem: Coping: Goal: Coping ability will improve Outcome: Not Progressing

## 2021-04-02 NOTE — H&P (Signed)
Psychiatric Admission Assessment Adult  Patient Identification: Emily [name] MRN:  [id number] Date of Evaluation:  04/02/2021 Chief Complaint:  MDD (major depressive disorder) [F32.9] Principal Diagnosis: MDD (major depressive disorder) Diagnosis:  Principal Problem:   MDD (major depressive disorder)  History of Present Illness:  Emily [name] 35 year old Caucasian female presents after recent transfer from [facility] due to suicidal ideation.  Patient was seen and evaluated face-to-face. Danae is very minimal throughout this assessment and a poor historian. Mild though blocking noted. She is unengaged throughout this assessment answering questing with a sharp " yes or no."  Reports self injures behavior 1 week ago. " I cut my wrist because I am depressed."  Denies that she is followed by therapy and/or psychiatry currently.  Denied that she is prescribed any psychotropic medications currently or in the past. Denied previous inpatient admission or previous suicidal attempts.   Per chart review patient has a history with opioid and methamphetamine use/abuse. Pending legal charges. Patient reported " I just don't want to live anymore."  Patient reported helplessness and worsening depression. Denied auditory or visual hallucinations.    Discussed initiating medication to assist with mood. She was receptive and closed her eye. Was charted that patient had scattered thoughts and garbled speech on admission. UDS- . CMP: AST/ALT 36/26 and CBC; elevated lump at 45.4. Patient reported family history with mental illness, didn't elaborate.     Associated Signs/Symptoms: Depression Symptoms:  depressed mood, anhedonia, insomnia, feelings of worthlessness/guilt, difficulty concentrating, disturbed sleep, decreased appetite, Duration of Depression Symptoms: No data recorded (Hypo) Manic Symptoms:  Distractibility, Impulsivity, Irritable Mood, Anxiety Symptoms:  Excessive Worry, Psychotic  Symptoms:  Paranoia, PTSD Symptoms: NA Total Time spent with patient: 20 minutes  Past Psychiatric History:   Is the patient at risk to self? No.  Has the patient been a risk to self in the past 6 months? No.  Has the patient been a risk to self within the distant past? No.  Is the patient a risk to others? No.  Has the patient been a risk to others in the past 6 months? No.  Has the patient been a risk to others within the distant past? No.   Prior Inpatient Therapy:   Prior Outpatient Therapy:    Alcohol Screening: 1. How often do you have a drink containing alcohol?: 2 to 4 times a month 2. How many drinks containing alcohol do you have on a typical day when you are drinking?: 1 or 2 3. How often do you have six or more drinks on one occasion?: Less than monthly AUDIT-C Score: 3 4. How often during the last year have you found that you were not able to stop drinking once you had started?: Never 5. How often during the last year have you failed to do what was normally expected from you because of drinking?: Never 6. How often during the last year have you needed a first drink in the morning to get yourself going after a heavy drinking session?: Never 7. How often during the last year have you had a feeling of guilt of remorse after drinking?: Never 8. How often during the last year have you been unable to remember what happened the night before because you had been drinking?: Never 9. Have you or someone else been injured as a result of your drinking?: No 10. Has a relative or friend or a doctor or another health worker been concerned about your drinking or suggested you cut down?: No  Alcohol Use Disorder Identification Test Final Score (AUDIT): 3 Substance Abuse History in the last 12 months:  Yes.   Consequences of Substance Abuse: NA Previous Psychotropic Medications: No  Psychological Evaluations: No  Past Medical History:  Past Medical History:  Diagnosis Date   Anxiety     Anxiety    Depression    History reviewed. No pertinent surgical history. Family History: History reviewed. No pertinent family history. Family Psychiatric  History:  Tobacco Screening: Have you used any form of tobacco in the last 30 days? (Cigarettes, Smokeless Tobacco, Cigars, and/or Pipes): Yes Tobacco use, Select all that apply: 5 or more cigarettes per day Are you interested in Tobacco Cessation Medications?: No, patient refused Counseled patient on smoking cessation including recognizing danger situations, developing coping skills and basic information about quitting provided: Refused/Declined practical counseling Social History:  Social History   Substance and Sexual Activity  Alcohol Use Yes   Comment: 2-3 drinks but none recently per pt report     Social History   Substance and Sexual Activity  Drug Use No    Additional Social History:                           Allergies:   Allergies  Allergen Reactions   Bee Venom    Lab Results: No results found for this or any previous visit (from the past 48 hour(s)).  Blood Alcohol level:  Lab Results  Component Value Date   ETH <5 10/21/2015    Metabolic Disorder Labs:  No results found for: HGBA1C, MPG No results found for: PROLACTIN No results found for: CHOL, TRIG, HDL, CHOLHDL, VLDL, LDLCALC  Current Medications: Current Facility-Administered Medications  Medication Dose Route Frequency Provider Last Rate Last Admin   ARIPiprazole (ABILIFY) tablet 5 mg  5 mg Oral Daily Jahzier Villalon, Jerene Pitch, NP       feeding supplement (ENSURE ENLIVE / ENSURE PLUS) liquid 237 mL  237 mL Oral BID BM Oneta Rack, NP       hydrOXYzine (ATARAX/VISTARIL) tablet 25 mg  25 mg Oral Q4H PRN Oneta Rack, NP       sertraline (ZOLOFT) tablet 25 mg  25 mg Oral Daily Oneta Rack, NP       traZODone (DESYREL) tablet 50 mg  50 mg Oral QHS PRN Oneta Rack, NP       PTA Medications: Medications Prior to Admission   Medication Sig Dispense Refill Last Dose   OLANZapine (ZYPREXA) 7.5 MG tablet Take 1 tablet (7.5 mg total) by mouth at bedtime. (Patient not taking: No sig reported) 30 tablet 0 Not Taking   traZODone (DESYREL) 100 MG tablet Take 1 tablet (100 mg total) by mouth at bedtime as needed for sleep. (Patient not taking: No sig reported) 30 tablet 0 Not Taking    Musculoskeletal: Strength & Muscle Tone: within normal limits Gait & Station: normal Patient leans: N/A            Psychiatric Specialty Exam:  Presentation  General Appearance:  Disheveled Eye Contact: Minimal Speech: Slow Speech Volume: Decreased Handedness: Right  Mood and Affect  Mood: Irritable; Hopeless Affect: Blunt; Depressed  Thought Process  Thought Processes: Coherent Duration of Psychotic Symptoms: No data recorded Past Diagnosis of Schizophrenia or Psychoactive disorder: No data recorded Descriptions of Associations:Intact Orientation:Full (Time, Place and Person) Thought Content:Illogical Hallucinations:Hallucinations: None Ideas of Reference:None Suicidal Thoughts:Suicidal Thoughts: Yes, Passive SI Passive Intent and/or Plan:  Without Intent Homicidal Thoughts:Homicidal Thoughts: No  Sensorium  Memory: Immediate Fair; Recent Fair; Remote Fair Judgment: Poor Insight: Fair  Chartered certified accountant: Fair Attention Span: Poor Recall: Poor Fund of Knowledge: Good Language: Poor  Psychomotor Activity  Psychomotor Activity: Psychomotor Activity: Normal  Assets  Assets: Social Support; Transportation; Desire for Improvement  Sleep  Sleep: Sleep: Poor Number of Hours of Sleep: 4   Physical Exam: Physical Exam Vitals reviewed.  Cardiovascular:     Rate and Rhythm: Normal rate.  Neurological:     Mental Status: She is alert.  Psychiatric:        Attention and Perception: Attention normal.        Mood and Affect: Mood normal.        Speech: Speech is  delayed.        Behavior: Behavior is agitated.        Thought Content: Thought content normal.        Cognition and Memory: Memory is impaired.        Judgment: Judgment normal.   ROS Blood pressure 109/78, pulse 80, temperature 98.5 F (36.9 C), temperature source Oral, resp. rate 16, height 5\' 3"  (1.6 m), weight 46.7 kg, SpO2 100 %. Body mass index is 18.25 kg/m.  Treatment Plan Summary: Daily contact with patient to assess and evaluate symptoms and progress in treatment and Medication management  Observation Level/Precautions:  15 minute checks  Laboratory:  CBC Chemistry Profile HbAIC UDS prolactin, EKG and lipid- pending results   Psychotherapy: Individual and group session  Medications:  Zoloft 25 mg,  Abilify 5 mg daily and trazodone 50 mg nightly  Consultations:  Psychiatrist and CSW  Discharge Concerns:  Safety, stabilization, and risk of access to medication and medication stabilization    Estimated LOS: 5-7days  Other:     Physician Treatment Plan for Primary Diagnosis: MDD (major depressive disorder) Long Term Goal(s): Improvement in symptoms so as ready for discharge  Short Term Goals: Ability to identify changes in lifestyle to reduce recurrence of condition will improve, Ability to verbalize feelings will improve, Ability to disclose and discuss suicidal ideas, and Ability to maintain clinical measurements within normal limits will improve  Physician Treatment Plan for Secondary Diagnosis: Principal Problem:   MDD (major depressive disorder)  Long Term Goal(s): Improvement in symptoms so as ready for discharge  Short Term Goals: Ability to identify changes in lifestyle to reduce recurrence of condition will improve, Ability to verbalize feelings will improve, Ability to identify and develop effective coping behaviors will improve, Compliance with prescribed medications will improve, and Ability to identify triggers associated with substance abuse/mental health  issues will improve  I certify that inpatient services furnished can reasonably be expected to improve the patient's condition.    , NP 7/9/20223:34 PM

## 2021-04-02 NOTE — [facility] Group Notes (Signed)
[facility] Group Notes:  (Nursing/MHT/Case Management/Adjunct)   Date:  04/02/2021  Time:  7:39 PM   Type of Therapy:  Nurse Education   Participation Level:  Did Not Attend   Participation Quality:     Affect:     Cognitive:     Insight:     Engagement in Group:     Modes of Intervention:  Education   Summary of Progress/Problems:  Did not attend despite staff invitation.   Azzan Butler V Cage Gupton 04/02/2021, 7:39 PM 

## 2021-04-03 DIAGNOSIS — F29 Unspecified psychosis not due to a substance or known physiological condition: Secondary | ICD-10-CM

## 2021-04-03 LAB — HEPATITIS PANEL, ACUTE
HCV Ab: REACTIVE — AB
Hep A IgM: NONREACTIVE
Hep B C IgM: NONREACTIVE
Hepatitis B Surface Ag: NONREACTIVE

## 2021-04-03 MED ORDER — LORAZEPAM 1 MG PO TABS: 1.0000 mg | ORAL_TABLET | Freq: Four times a day (QID) | ORAL | Status: AC | PRN

## 2021-04-03 MED ORDER — LOPERAMIDE HCL 2 MG PO CAPS: 2.0000 mg | ORAL_CAPSULE | ORAL | Status: AC | PRN

## 2021-04-03 MED ORDER — ONDANSETRON 4 MG PO TBDP: 4.0000 mg | ORAL_TABLET | Freq: Four times a day (QID) | ORAL | Status: AC | PRN

## 2021-04-03 MED ORDER — ACETAMINOPHEN 325 MG PO TABS
650.0000 mg | ORAL_TABLET | Freq: Four times a day (QID) | ORAL | Status: DC | PRN
  Administered 2021-04-06 – 2021-04-07 (×2): 650 mg via ORAL
  Filled 2021-04-03 (×2): qty 2

## 2021-04-03 MED ORDER — THIAMINE HCL 100 MG PO TABS
100.0000 mg | ORAL_TABLET | Freq: Every day | ORAL | Status: DC
  Administered 2021-04-04 – 2021-04-11 (×8): 100 mg via ORAL
  Filled 2021-04-03 (×11): qty 1

## 2021-04-03 MED ORDER — GABAPENTIN 100 MG PO CAPS
100.0000 mg | ORAL_CAPSULE | Freq: Three times a day (TID) | ORAL | Status: DC
  Administered 2021-04-03 – 2021-04-04 (×4): 100 mg via ORAL
  Filled 2021-04-03 (×10): qty 1

## 2021-04-03 MED ORDER — ADULT MULTIVITAMIN W/MINERALS CH
1.0000 | ORAL_TABLET | Freq: Every day | ORAL | Status: DC
  Administered 2021-04-03 – 2021-04-11 (×9): 1 via ORAL
  Filled 2021-04-03 (×13): qty 1

## 2021-04-03 MED ORDER — MAGNESIUM HYDROXIDE 400 MG/5ML PO SUSP: 5.0000 mL | Freq: Every day | ORAL | Status: DC | PRN

## 2021-04-03 MED ORDER — ALUM & MAG HYDROXIDE-SIMETH 200-200-20 MG/5ML PO SUSP: 15.0000 mL | Freq: Four times a day (QID) | ORAL | Status: DC | PRN

## 2021-04-03 MED ORDER — PANTOPRAZOLE SODIUM 40 MG PO TBEC
40.0000 mg | DELAYED_RELEASE_TABLET | Freq: Every day | ORAL | Status: DC
  Administered 2021-04-03 – 2021-04-11 (×9): 40 mg via ORAL
  Filled 2021-04-03 (×13): qty 1

## 2021-04-03 NOTE — [facility] Counselor (Signed)
Adult Comprehensive Assessment  Patient ID: Emily [name], female   DOB: 17-Jul-1986, 35 y.o.   MRN: [id number]  Information Source: Information source: Patient   Current Stressors:  Patient states their primary concerns and needs for treatment are: "I felt down." Patient states their goals for this hospitalization and ongoing recovery are: "Get myself back together." Employment / Job issues: None reported Family Relationships: "They don't talk to me much." Financial / Lack of resources (include bankruptcy): "I have no income, no help and no support." Housing / Lack of housing: "I have no income, no help and no support." Physical health (include injuries & life threatening diseases): None reported Social relationships: Pt denies Substance abuse: Pt denies Bereavement / Loss: Grandma in August 2021   Living/Environment/Situation:  Living Arrangements: Sister's home Living conditions (as described by patient or guardian): "we barely talk" How long has patient lived in current situation?: "a few months" What is atmosphere in current home: Comfortable   Family History:  Marital status: Single Does patient have children?: Yes How many children?: 2 How is patient's relationship with their children?: Pt reports she "don't talk to them much, I go my way they go theirs".   Childhood History:  By whom was/is the patient raised?: Mother Description of patient's relationship with caregiver when they were a child: Good Patient's description of current relationship with people who raised him/her: "Just alright", per the pt. Does patient have siblings?: Yes Number of Siblings: 2 Description of patient's current relationship with siblings: Pt reports "it's like they're not there" Did patient suffer any verbal/emotional/physical/sexual abuse as a child?: No Did patient suffer from severe childhood neglect?: Yes Has patient ever been sexually abused/assaulted/raped as an adolescent or adult?:  No Was the patient ever a victim of a crime or a disaster?: No Witnessed domestic violence?: No Has patient been effected by domestic violence as an adult?: No   Education:  Highest grade of school patient has completed: 12th grade Currently a student?: No Learning disability?: Yes   Employment/Work Situation:   Employment situation:  (Pt is currently applying for disabilty) What is the longest time patient has a held a job?: pt reports "she does not know" Has patient ever been in the [employer]?: No   Financial Resources:     Alcohol/Substance Abuse:   What has been your use of drugs/alcohol within the last 12 months?: None reported If attempted suicide, did drugs/alcohol play a role in this?: Yes (Crack cocaine) Alcohol/Substance Abuse Treatment Hx: Past Tx, Inpatient If yes, describe treatment: [facility] for 1.5-2 mo's Has alcohol/substance abuse ever caused legal problems?: Yes (One DUI and one paraphernelia)   Social Support System:   Patient's Community Support System: Fair Museum/gallery exhibitions officer System: friends and self Type of faith/religion: Baptist How does patient's faith help to cope with current illness?: Pt reports "I didn't see that necessary".   Leisure/Recreation:   Leisure and Hobbies: Pt reports "I like to watch movies"   Strengths/Needs:   What things does the patient do well?: Playing sports, softball ,volleyball In what areas does patient struggle / problems for patient: Communicating with certain people   Discharge Plan:   Does patient have access to transportation?: Yes Will patient be returning to same living situation after discharge?: No, thinks she will prob. go to a friend's home Currently receiving community mental health services: No If no, would patient like referral for services when discharged?: Yes (What county?) [facility], [place]) Patient description of barriers related  to discharge medications:  (Lack of income)    Summary/Recommendations:   Summary and Recommendations (to be completed by the evaluator):Vickye Burgueno was admitted with ongoing depression. Recent Stressors include no income and lack of family relationships. Pt currently sees no outpatient providers. While here, Estelene Carmack can benefit from crisis stabilization, medication management, therapeutic milieu, and referrals for services.    Felizardo Hoffmann. 04/03/2021

## 2021-04-03 NOTE — Progress Notes (Signed)
Patient heard briefly yelling out while laying in bed. When this nurse approached her room to ask if she was okay, she said that she "saw something". Pt reported that she probably was dreaming.

## 2021-04-03 NOTE — [facility] Group Notes (Signed)
LCSW Group Therapy Note   Type of Therapy and Topic:  Group Therapy - Healthy vs Unhealthy Coping Skills  Participation Level:  Did Not Attend  Description of Group The focus of this group was to determine what unhealthy coping techniques typically are used by group members and what healthy coping techniques would be helpful in coping with various problems. Patients were guided in becoming aware of the differences between healthy and unhealthy coping techniques. Patients were asked to identify 2-3 healthy coping skills they would like to learn to use more effectively.  Therapeutic Goals 1. Patients learned that coping is what human beings do all day long to deal with various situations in their lives 2. Patients defined and discussed healthy vs unhealthy coping techniques 3. Patients identified their preferred coping techniques and identified whether these were healthy or unhealthy 4. Patients determined 2-3 healthy coping skills they would like to become more familiar with and use more often. 5. Patients provided support and ideas to each other   Therapeutic Modalities Cognitive Behavioral Therapy Motivational Interviewing  Chrys Racer 04/03/2021  10:58 AM

## 2021-04-03 NOTE — Progress Notes (Addendum)
   04/03/21 1300  Psych Admission Type (Psych Patients Only)  Admission Status Voluntary  Psychosocial Assessment  Patient Complaints Anergic  Eye Contact Brief  Facial Expression Blank  Affect Flat  Speech Soft  Interaction Minimal  Motor Activity Slow  Appearance/Hygiene Disheveled  Behavior Characteristics Cooperative;Calm  Mood Sad  Thought Process  Coherency WDL  Content WDL  Delusions WDL  Perception Hallucinations  Hallucination None reported or observed  Judgment Impaired  Confusion WDL  Danger to Self  Current suicidal ideation? Denies  Danger to Others  Danger to Others None reported or observed   D.Pt presents a little brighter today- observed in the milieu at times, although minimal interaction with peers. Pt does report that her mood has improved since admission. Pt denies SI/HI and A/VH .A. Labs and vitals monitored. Pt given and educated on medications. Pt supported emotionally and encouraged to express concerns and ask questions.   R. Pt remains safe with 15 minute checks. Will continue POC.

## 2021-04-03 NOTE — [facility] Suicide Risk Assessment (Signed)
[facility] Admission Suicide Risk Assessment   Nursing information obtained from:  Patient Demographic factors:  Caucasian, Low socioeconomic status, unemployed Current Mental Status:  Suicidal ideation indicated by patient Loss Factors:  Financial problems / change in socioeconomic status Historical Factors:  Impulsivity Risk Reduction Factors:  living with sister  Total Time Spent in Direct Patient Care:  I personally spent 40 minutes on the unit in direct patient care. The direct patient care time included face-to-face time with the patient, reviewing the patient's chart, communicating with other professionals, and coordinating care. Greater than 50% of this time was spent in counseling or coordinating care with the patient regarding goals of hospitalization, psycho-education, and discharge planning needs.  Principal Problem: Schizophrenia spectrum disorder with psychotic disorder type not yet determined ([place]) Diagnosis:  Principal Problem:   Schizophrenia spectrum disorder with psychotic disorder type not yet determined ([place])  Subjective Data: Patient is a 35y/o female who was transferred as a voluntary patient from [facility] due to suicidal ideation. The patient is a poor historian and majority of history is obtained from chart review. The patient is vague as to precipitating events that led to her going on her own to the ED yesterday. She states "I was feeling sad and anxious," and "I had a lot on my mind." When questioned about recent stressors she states she is unemployed, is living with her sister, and her 17 and 31y/o children are living with their grandparents. She states she had sudden onset of SI without a specific plan but cannot describe how long she has felt depressed or how long she was suicidal before seeking treatment. She denies HI or current SI and can contract for safety. She states her sleep recently has been "fine" and she denies issues with anhedonia. She does endorse  recent low energy, poor focus and fair appetite. She denies AVH but admits to general feeligns of paranoia. When questioned about first rank symptoms she endorses belief in thought insertion/withdrawal by unknown persons and states she may be getting messages from family or through the TV but is vague. She denies belief in thought broadcasting. She states she has no previous psychiatric medication trials and denies prior suicide attempts. She thinks she was admitted to a psychiatric unit in the past but cannot say where or when or for what reason. She denies h/o mania/hypomania. She admits to drinking alcohol in the past but is vague stating she last drank between 1-2 months or maybe a year ago. She reports a remote h/o methamphetamine abuse but her UDS prior to admission was negative and she denies recent use of any illicit substances.   Per her records, she was admitted at [facility] psychiatry unit in 2017 for unspecified schizophrenia spectrum d/o, amphetamine use d/o, and cannabis use d/o and was discharged on Zyprexa 7.84m qhs. [facility] schizoaffective d/o bipolar type vs substance induced mood d/o with opioid dependence and polysubstance abuse and was discharged on Haldol 553mbid. Her records reflect that she previously was using IV heroin and crack, and benzodiazepines in the past. [facility] at which time she was on [facility].  See H&P for additonal details.   Continued Clinical Symptoms:  Alcohol Use Disorder Identification Test Final Score (AUDIT): 3 The "Alcohol Use Disorders Identification Test", Guidelines for Use in Primary Care, Second Edition.  [facility] Score between 0-7:  no or low  risk or alcohol related problems. Score between 8-15:  moderate risk of alcohol related problems. Score between 16-19:  high risk of alcohol related problems. Score 20 or above:   warrants further diagnostic evaluation for alcohol dependence and treatment.  CLINICAL FACTORS:   Depression:   Hopelessness More than one psychiatric diagnosis Currently Psychotic Previous Psychiatric Diagnoses and Treatments  Musculoskeletal: Strength & Muscle Tone: within normal limits Gait & Station:  untested in bed Patient leans: N/A  Psychiatric Specialty Exam: Physical Exam Vitals and nursing note reviewed.  HENT:     Head: Normocephalic.     Mouth/Throat:     Comments: edentulous Pulmonary:     Effort: Pulmonary effort is normal.  Neurological:     Mental Status: She is alert.    Review of Systems - would not cooperate for questioning  Blood pressure 98/70, pulse (!) 105, temperature 97.8 F (36.6 C), temperature source Oral, resp. rate 16, height _0  (1.6 m), weight 46.7 kg, SpO2 98 %.Body mass index is 18.25 kg/m.  General Appearance:  disheveled in hospital gown, thin  Eye Contact:  Fair  Speech:  Clear and Coherent and Normal Rate  Volume:  Decreased  Mood:  Dysphoric and Irritable  Affect:  Constricted  Thought Process:  Disorganized, vague, evasive  Orientation:  oriented to city, state, year, and month  Thought Content:   endorses paranoia and belief in thought insertion/withdrawal and ideas of reference; denies AVH and is not grossly responding to internal/external stimuli on exam  Suicidal Thoughts:   had SI without a plan prior to admission but denies current SI and can contract for safety  Homicidal Thoughts:  No  Memory:  Recent;   Poor Remote;   Poor  Judgement:  Impaired  Insight:  Lacking  Psychomotor Activity:  resting in bed - no noted tremors  Concentration:  Concentration: Poor and Attention Span: Poor  Recall:  Poor  Fund of Knowledge:  Poor  Language:  Fair  Akathisia:  Negative  Assets:  Communication Skills Desire for Improvement Resilience  ADL's:  Impaired  Cognition:  Impaired,  Mild  Sleep:  Number of Hours: 5.5    COGNITIVE FEATURES THAT CONTRIBUTE TO RISK:  Loss of executive function and Thought constriction (tunnel vision)    SUICIDE RISK:   Moderate:  Frequent suicidal ideation with limited intensity, and duration, some specificity in terms of plans, no associated intent, good self-control, limited dysphoria/symptomatology, some risk factors present, and identifiable protective factors, including available and accessible social support.  PLAN OF CARE: Patient admitted voluntarily to [facility]. Admission labs reviewed: WBC 4.5, H/H 12.8/37.8 platelets 205; Na+ 139 K+ 3.8, CL 104, BUN 18, Anion gap 19 down to 12, Creatinine 0.6, glucose 123 down to 93, AST 56 down to 36 ALT 36 down to 26; alk phos 60 down to 38, total protein 6.4, albumin 3.8; UA shows trace ketones large mucus 1+ protein, 6 RBC, few bacteria, 5 WBC, 86 squamous cells, calcium oxalate crystals; UPT negative, UDS negative, respiratory panel negative; EKG shows NSR 61bpm with QTC 480m. Will repeat clean catch UA, order TSH, lipid and A1c.  Patient was started by NP on Abilify 523mdaily and Zoloft 2527maily. We are attempting to get collateral with her verbal consent from his sister NicElmyra [phone] order Head CT when she will cooperate for testing as well as attempt MOCA/MMSE when she will cooperate. Will place on COWS and CIWA monitoring in the event that she has underreported substance  use or used substances that will not show on drug screen prior to admission. Will place on Protonix 46m prophylactically as well as seizure precautions and will start on Neurontin 1021mtid (appears she has been on Neurontin in the past). Will attempt to clarify home medication regimen. From her history it is difficult to say if she has schizoaffective d/o or if this could be MDD with psychotic features. She has a longstanding h/o prior substance use, but her UDS on admission was negative.   I certify that inpatient services furnished can reasonably be  expected to improve the patient's condition.   AmHarlow AsaMD, FAPA 04/03/2021, 10:47 AM

## 2021-04-03 NOTE — Progress Notes (Signed)
Pt did not attend group. 

## 2021-04-03 NOTE — [facility] Suicide Risk Assessment (Signed)
[facility] INPATIENT:  Family/Significant Other Suicide Prevention Education  Suicide Prevention Education:  Patient Refusal for Family/Significant Other Suicide Prevention Education: The patient Emily [name] has refused to provide written consent for family/significant other to be provided Family/Significant Other Suicide Prevention Education during admission and/or prior to discharge.  Physician notified.  Felizardo Hoffmann 04/03/2021, 12:12 PM

## 2021-04-03 NOTE — Progress Notes (Addendum)
Patient ID: Emily [name], female   DOB: 10/06/1985, 35 y.o.   MRN: [id number]  NP attempted to follow-up with sister Joni Reining  at [phone].for additional collateral. At 11:54 am no answer. HIPPA safe message was left.   T.Melvyn Neth NP

## 2021-04-03 NOTE — Progress Notes (Signed)
   04/03/21 2223  Psych Admission Type (Psych Patients Only)  Admission Status Voluntary  Psychosocial Assessment  Patient Complaints None  Eye Contact Brief  Facial Expression Other (Comment) (appropriate)  Affect Appropriate to circumstance  Speech Unremarkable  Interaction Minimal  Motor Activity Other (Comment) (WDL)  Appearance/Hygiene In hospital gown  Behavior Characteristics Appropriate to situation  Mood Pleasant  Thought Process  Coherency WDL  Content WDL  Delusions None reported or observed  Perception WDL  Hallucination None reported or observed  Judgment Impaired  Confusion None  Danger to Self  Current suicidal ideation? Denies  Danger to Others  Danger to Others None reported or observed

## 2021-04-04 DIAGNOSIS — F29 Unspecified psychosis not due to a substance or known physiological condition: Secondary | ICD-10-CM | POA: Diagnosis not present

## 2021-04-04 LAB — PROLACTIN: Prolactin: 3.5 ng/mL — ABNORMAL LOW (ref 4.8–23.3)

## 2021-04-04 LAB — HEMOGLOBIN A1C
Hgb A1c MFr Bld: 4.5 % — ABNORMAL LOW (ref 4.8–5.6)
Mean Plasma Glucose: 82.45 mg/dL

## 2021-04-04 LAB — URINALYSIS, COMPLETE (UACMP) WITH MICROSCOPIC
Bilirubin Urine: NEGATIVE
Glucose, UA: NEGATIVE mg/dL
Hgb urine dipstick: NEGATIVE
Ketones, ur: NEGATIVE mg/dL
Leukocytes,Ua: NEGATIVE
Nitrite: NEGATIVE
Protein, ur: NEGATIVE mg/dL
Specific Gravity, Urine: 1.025 (ref 1.005–1.030)
pH: 7 (ref 5.0–8.0)

## 2021-04-04 LAB — LIPID PANEL
Cholesterol: 180 mg/dL (ref 0–200)
HDL: 64 mg/dL (ref >40)
LDL Cholesterol: 104 mg/dL — ABNORMAL HIGH (ref 0–99)
Total CHOL/HDL Ratio: 2.8 RATIO
Triglycerides: 62 mg/dL (ref <150)
VLDL: 12 mg/dL (ref 0–40)

## 2021-04-04 LAB — TSH: TSH: 0.482 u[IU]/mL (ref 0.350–4.500)

## 2021-04-04 MED ORDER — SERTRALINE HCL 50 MG PO TABS
50.0000 mg | ORAL_TABLET | Freq: Every day | ORAL | Status: DC
  Administered 2021-04-05: 50 mg via ORAL
  Filled 2021-04-04 (×4): qty 1

## 2021-04-04 MED ORDER — HYDROXYZINE HCL 25 MG PO TABS
25.0000 mg | ORAL_TABLET | Freq: Four times a day (QID) | ORAL | Status: DC | PRN
  Administered 2021-04-04 – 2021-04-10 (×6): 25 mg via ORAL
  Filled 2021-04-04 (×7): qty 1

## 2021-04-04 NOTE — Plan of Care (Signed)
Nurse discussed anxiety, depression and coping skills with patient.  

## 2021-04-04 NOTE — Progress Notes (Signed)
NUTRITION ASSESSMENT  Pt identified as at risk on the Malnutrition Screen Tool  INTERVENTION: 1. Supplements: Ensure Enlive po BID, each supplement provides 350 kcal and 20 grams of protein   NUTRITION DIAGNOSIS: Unintentional weight loss related to sub-optimal intake as evidenced by pt report.   Goal: Pt to meet >/= 90% of their estimated nutrition needs.  Monitor:  PO intake  Assessment:  Pt admitted with depression. H/o Schizophrenia spectrum disorder with psychotic disorder.  Pt reported decreased appetite. Per weight history, pt has lost 13 lbs since 3/2 (11% wt loss x 4 months, significant for time frame).  Ensure supplements have been ordered.  Height: Ht Readings from Last 1 Encounters:  04/02/21 5\' 3"  (1.6 m)    Weight: Wt Readings from Last 1 Encounters:  04/02/21 46.7 kg    Weight Hx: Wt Readings from Last 10 Encounters:  04/02/21 46.7 kg  06/28/16 70.8 kg    BMI:  Body mass index is 18.25 kg/m. Pt meets criteria for underweight based on current BMI.  Estimated Nutritional Needs: Kcal: 25-30 kcal/kg Protein: > 1 gram protein/kg Fluid: 1 ml/kcal  Diet Order:  Diet Order             Diet regular Room service appropriate? Yes; Fluid consistency: Thin  Diet effective now                  Pt is also offered choice of unit snacks mid-morning and mid-afternoon.  Pt is eating as desired.   Lab results and medications reviewed.   08/28/16, MS, RD, LDN Inpatient Clinical Dietitian Contact information available via Amion

## 2021-04-04 NOTE — Progress Notes (Signed)
D:  Patient denied SI and HI, contracts for safety.  Denied A/V hallucinations.  Denied pain. A:  Medications administered per MD orders.  Emotional support and encouragement given patient. R:  Safety maintained with 15 minute checks.  Patient signed the HIV consent form per MD request.  Patient's UA placed in lab refrigerator for pick up.

## 2021-04-04 NOTE — [facility] Group Notes (Signed)
Occupational Therapy Group Note Date: 04/04/2021 Group Topic/Focus: Feelings Management  Group Description: Group encouraged increased engagement and participation through discussion focused on Building Happiness. Patients were provided a handout and reviewed therapeutic strategies to build happiness including identifying gratitudes, random acts of kindness, exercise, meditation, positive journaling, and fostering relationships. Patients engaged in discussion and encouraged to reflect on each strategy and their experiences.  Therapeutic Goal(s): Identify strategies to build happiness. Identify and implement therapeutic strategies to improve overall mood. Practice and identify gratitudes, random acts of kindness, exercise, meditation, positive journaling, and fostering relationships Participation Level: Patient did not attend OT group session despite personal invitation.    Plan: Continue to engage patient in OT groups 2 - 3x/week.  04/04/2021  Abenezer Odonell, MOT, OTR/L 

## 2021-04-04 NOTE — Progress Notes (Signed)
Recreation Therapy Notes  Date: 7.11.22 Time: 0930 Location: [address] Dayroom  Group Topic: Stress Management   Goal Area(s) Addresses:  Patient will actively participate in stress management techniques presented during session.  Patient will successfully identify benefit of practicing stress management post d/c.   Intervention: Guided exercise with ambient sound and script  Activity :Guided Imagery  LRT provided education, instruction, and demonstration on practice of visualization via guided imagery. Patient was asked to participate in the technique introduced during session. LRT also debriefed including topics of mindfulness, stress management and specific scenarios each patient could use these techniques. Patients were given suggestions of ways to access scripts post d/c and encouraged to explore Youtube and other apps available on smartphones, tablets, and computers.   Education:  Stress Management, Discharge Planning.   Education Outcome: Acknowledges education  Clinical Observations/Feedback: Patient did not attend group session.      Caroll Rancher, LRT/CTRS         Lillia Abed, [place] A 04/04/2021 11:07 AM

## 2021-04-04 NOTE — Progress Notes (Signed)
Psychoeducational Group Note  Date:  04/04/2021 Time:  2015  Group Topic/Focus:  Wrap up group  Participation Level: Did Not Attend  Participation Quality:  Not Applicable  Affect:  Not Applicable  Cognitive:  Not Applicable  Insight:  Not Applicable  Engagement in Group: Not Applicable  Additional Comments: Did not attend.   Marcille Buffy 04/04/2021, 8:59 PM

## 2021-04-04 NOTE — [facility] Counselor (Signed)
CSW provided the patient with a packet of information that contains housing resources, free and reduced food resources, IRC information, GoodRX cards, and suicide prevention information to help the patient with discharge planning.   

## 2021-04-04 NOTE — Progress Notes (Signed)
[facility] MD Progress Note  04/04/2021 5:41 PM Emily [name]  MRN:  [id number]  Reason for admission:  Emily [name] is a 35 year old female with a history of substance abuse, schizophrenia and bipolar disorder admitted voluntarily from [facility] ED for suicidal ideation without a plan and worsening depression and anxiety.  Emily [name] also endorsed auditory hallucinations on presentation to the ED.  Objective: Medical record reviewed.  Patient's case discussed in detail with members of the treatment team.  I met with and evaluated the patient on the unit today for follow-up.  Patient is poorly engaged in interaction with me and opens her eyes briefly only to participate for a short period of time in our conversation.  Emily [name] gives only limited responses and eventually stops answering questions to return to sleep after just a few minutes.  Due to lack of engagement in the conversation, it is unclear how accurate the information Emily [name] does provide is.  There is no evidence of diaphoresis, rhinorrhea, lacrimation, piloerection or tremor.  Patient states that Emily [name] was admitted for a pain pill addiction and is experiencing withdrawal symptoms of aches.  Emily [name] reports intermittently experiencing diarrhea and constipation but cannot say which Emily [name] most recently experienced.  Emily [name] endorses depressed mood and problems sleeping.  Emily [name] states appetite is okay.  Patient denies SI, AH, VH, AI, PI or thought broadcasting but does not really listen to the questions before answering.  Emily [name] declines to answer orientation questions.  Emily [name] denies any medication side effects.  Emily [name] does not endorse any other physical problems with declines to participate in review of systems questions.  Patient returns to sleep prior to completion of all of the interview.  The patient slept 6.25 hours last night.  Vital signs have been stable and within normal limits.  Chart notes indicate patient spent most of yesterday resting in her room and appeared a bit brighter  yesterday evening.  Emily [name] did not attend groups again today.  Emily [name] has been taking scheduled medications as prescribed.  Patient took 2 doses of PRN hydroxyzine yesterday for anxiety and took PRN trazodone last night for sleep.  Emily [name] has not required any other PRN meds in the last 24 hours.  Principal Problem: Schizophrenia spectrum disorder with psychotic disorder type not yet determined ([place]) Diagnosis: Principal Problem:   Schizophrenia spectrum disorder with psychotic disorder type not yet determined ([place])  Total Time spent with patient: 20 minutes  Past Psychiatric History: See admission H&P  Past Medical History:  Past Medical History:  Diagnosis Date   Anxiety    Anxiety    Depression    History reviewed. No pertinent surgical history. Family History: History reviewed. No pertinent family history. Family Psychiatric  History: See admission H&P Social History:  Social History   Substance and Sexual Activity  Alcohol Use Yes   Comment: 2-3 drinks but none recently per pt report     Social History   Substance and Sexual Activity  Drug Use No    Social History   Socioeconomic History   Marital status: Single    Spouse name: Not on file   Number of children: Not on file   Years of education: Not on file   Highest education level: Not on file  Occupational History   Not on file  Tobacco Use   Smoking status: Every Day    Pack years: 0.00    Passive exposure: Past   Smokeless tobacco: Never  Substance and Sexual Activity   Alcohol use: Yes  Comment: 2-3 drinks but none recently per pt report   Drug use: No   Sexual activity: Not Currently  Other Topics Concern   Not on file  Social History Narrative   Not on file   Social Determinants of Health   Financial Resource Strain: Not on file  Food Insecurity: Not on file  Transportation Needs: Not on file  Physical Activity: Not on file  Stress: Not on file  Social Connections: Not on file   Additional Social  History:                         Sleep: Fair  Appetite:  Good  Current Medications: Current Facility-Administered Medications  Medication Dose Route Frequency Provider Last Rate Last Admin   acetaminophen (TYLENOL) tablet 650 mg  650 mg Oral Q6H PRN Nelda Marseille, Amy E, MD       alum & mag hydroxide-simeth (MAALOX/MYLANTA) 200-200-20 MG/5ML suspension 15 mL  15 mL Oral Q6H PRN Nelda Marseille, Amy E, MD       ARIPiprazole (ABILIFY) tablet 5 mg  5 mg Oral Daily Derrill Center, NP   5 mg at 04/04/21 0756   feeding supplement (ENSURE ENLIVE / ENSURE PLUS) liquid 237 mL  237 mL Oral BID BM Derrill Center, NP   237 mL at 04/04/21 0816   gabapentin (NEURONTIN) capsule 100 mg  100 mg Oral TID Harlow Asa, MD   100 mg at 04/04/21 1630   hydrOXYzine (ATARAX/VISTARIL) tablet 25 mg  25 mg Oral Q4H PRN Derrill Center, NP   25 mg at 04/03/21 2116   loperamide (IMODIUM) capsule 2-4 mg  2-4 mg Oral PRN Harlow Asa, MD       LORazepam (ATIVAN) tablet 1 mg  1 mg Oral Q6H PRN Nelda Marseille, Amy E, MD       magnesium hydroxide (MILK OF MAGNESIA) suspension 5 mL  5 mL Oral Daily PRN Harlow Asa, MD       multivitamin with minerals tablet 1 tablet  1 tablet Oral Daily Nelda Marseille, Amy E, MD   1 tablet at 04/04/21 0758   ondansetron (ZOFRAN-ODT) disintegrating tablet 4 mg  4 mg Oral Q6H PRN Harlow Asa, MD       pantoprazole (PROTONIX) EC tablet 40 mg  40 mg Oral Daily Nelda Marseille, Amy E, MD   40 mg at 04/04/21 0757   sertraline (ZOLOFT) tablet 25 mg  25 mg Oral Daily Derrill Center, NP   25 mg at 04/04/21 0756   thiamine tablet 100 mg  100 mg Oral Daily Nelda Marseille, Amy E, MD   100 mg at 04/04/21 0756   traZODone (DESYREL) tablet 50 mg  50 mg Oral QHS PRN Derrill Center, NP   50 mg at 04/03/21 2116    Lab Results:  Results for orders placed or performed during the hospital encounter of 04/02/21 (from the past 48 hour(s))  TSH     Status: Abnormal   Collection Time: 04/02/21  6:19 PM  Result  Value Ref Range   TSH 0.221 (L) 0.350 - 4.500 uIU/mL    Comment: Performed by a 3rd Generation assay with a functional sensitivity of <=0.01 uIU/mL. Performed at [facility], [place] [address], [place], [place] [address]   Prolactin     Status: Abnormal   Collection Time: 04/02/21  6:19 PM  Result Value Ref Range   Prolactin 3.5 (L) 4.8 - 23.3 ng/mL  Comment: (NOTE) Performed At: [facility] [place], [place] [id number] Rush Farmer MD [id number]   Hemoglobin A1c     Status: Abnormal   Collection Time: 04/02/21  6:19 PM  Result Value Ref Range   Hgb A1c MFr Bld 4.5 (L) 4.8 - 5.6 %    Comment: (NOTE) Pre diabetes:          5.7%-6.4%  Diabetes:              >6.4%  Glycemic control for   <7.0% adults with diabetes    Mean Plasma Glucose 82.45 mg/dL    Comment: Performed at [facility], [place] [address], [place], [place] [address]  Hepatitis panel, acute     Status: Abnormal   Collection Time: 04/02/21  6:19 PM  Result Value Ref Range   Hepatitis B Surface Ag NON REACTIVE NON REACTIVE   HCV Ab Reactive (A) NON REACTIVE    Comment: (NOTE) The CDC recommends that a Reactive HCV antibody result be followed up  with a HCV Nucleic Acid Amplification test.     Hep A IgM NON REACTIVE NON REACTIVE   Hep B C IgM NON REACTIVE NON REACTIVE    Comment: Performed at [facility], [place] [address] [place], [place] [address]  TSH     Status: None   Collection Time: 04/04/21  6:22 AM  Result Value Ref Range   TSH 0.482 0.350 - 4.500 uIU/mL    Comment: Performed by a 3rd Generation assay with a functional sensitivity of <=0.01 uIU/mL. Performed at [facility], [place] [address] [place], [place] [address]   Hemoglobin A1c     Status: Abnormal   Collection Time: 04/04/21  6:22 AM  Result Value Ref Range   Hgb A1c MFr Bld 4.5 (L) 4.8 - 5.6 %    Comment: (NOTE) Pre diabetes:          5.7%-6.4%  Diabetes:               >6.4%  Glycemic control for   <7.0% adults with diabetes    Mean Plasma Glucose 82.45 mg/dL    Comment: Performed at [place] [address] [place], [place] [address]  Lipid panel     Status: Abnormal   Collection Time: 04/04/21  6:22 AM  Result Value Ref Range   Cholesterol 180 0 - 200 mg/dL   Triglycerides 62 <150 mg/dL   HDL 64 >40 mg/dL   Total CHOL/HDL Ratio 2.8 RATIO   VLDL 12 0 - 40 mg/dL   LDL Cholesterol 104 (H) 0 - 99 mg/dL    Comment:        Total Cholesterol/HDL:CHD Risk Coronary Heart Disease Risk Table                     Men   Women  1/2 Average Risk   3.4   3.3  Average Risk       5.0   4.4  2 X Average Risk   9.6   7.1  3 X Average Risk  23.4   11.0        Use the calculated Patient Ratio above and the CHD Risk Table to determine the patient's CHD Risk.        ATP III CLASSIFICATION (LDL):  <100     mg/dL   Optimal  100-129  mg/dL   Near or Above  Optimal  130-159  mg/dL   Borderline  160-189  mg/dL   High  >190     mg/dL   Very High Performed at [place] [address] [place], [place] [address]   Urinalysis, Complete w Microscopic Urine, Clean Catch     Status: Abnormal   Collection Time: 04/04/21  6:47 AM  Result Value Ref Range   Color, Urine YELLOW YELLOW   APPearance HAZY (A) CLEAR   Specific Gravity, Urine 1.025 1.005 - 1.030   pH 7.0 5.0 - 8.0   Glucose, UA NEGATIVE NEGATIVE mg/dL   Hgb urine dipstick NEGATIVE NEGATIVE   Bilirubin Urine NEGATIVE NEGATIVE   Ketones, ur NEGATIVE NEGATIVE mg/dL   Protein, ur NEGATIVE NEGATIVE mg/dL   Nitrite NEGATIVE NEGATIVE   Leukocytes,Ua NEGATIVE NEGATIVE   RBC / HPF 0-5 0 - 5 RBC/hpf   WBC, UA 0-5 0 - 5 WBC/hpf   Bacteria, UA FEW (A) NONE SEEN   Squamous Epithelial / LPF 6-10 0 - 5   Mucus PRESENT     Comment: Performed at [facility], [place] [address] [place], [place] [address]    Blood Alcohol level:  Lab Results   Component Value Date   ETH <5 27/02/2375    Metabolic Disorder Labs: Lab Results  Component Value Date   HGBA1C 4.5 (L) 04/04/2021   MPG 82.45 04/04/2021   MPG 82.45 04/02/2021   Lab Results  Component Value Date   PROLACTIN 3.5 (L) 04/02/2021   Lab Results  Component Value Date   CHOL 180 04/04/2021   TRIG 62 04/04/2021   HDL 64 04/04/2021   CHOLHDL 2.8 04/04/2021   VLDL 12 04/04/2021   LDLCALC 104 (H) 04/04/2021    Physical Findings: AIMS: Facial and Oral Movements Muscles of Facial Expression: None, normal Lips and Perioral Area: None, normal Jaw: None, normal Tongue: None, normal,Extremity Movements Upper (arms, wrists, hands, fingers): None, normal Lower (legs, knees, ankles, toes): None, normal, Trunk Movements Neck, shoulders, hips: None, normal, Overall Severity Severity of abnormal movements (highest score from questions above): None, normal Incapacitation due to abnormal movements: None, normal Patient's awareness of abnormal movements (rate only patient's report): No Awareness, Dental Status Current problems with teeth and/or dentures?: Yes Does patient usually wear dentures?: Yes  CIWA:  CIWA-Ar Total: 1 COWS:  COWS Total Score: 2  Musculoskeletal: Strength & Muscle Tone: within normal limits Gait & Station: normal Patient leans: N/A  Psychiatric Specialty Exam:  Presentation  General Appearance: [place] Contact:Poor  Speech:Slow; Other (comment) (succinct; mostly one-word or brief answers if at all)  Speech Volume:Decreased  Handedness:Right   Mood and Affect  Mood:Irritable; Dysphoric  Affect:Blunt   Thought Process  Thought Processes:Other (comment) (Unable to assess fully due to poor cooperation; superficially coherent)  Descriptions of Associations:-- (Unable to assess; patient poorly cooperative with interview)  Orientation:Other (comment) (Unable to assess)  Thought Content:Other (comment) (Unable to assess fully;  patient poorly cooperative with interview)  History of Schizophrenia/Schizoaffective disorder:No data recorded Duration of Psychotic Symptoms:No data recorded Hallucinations:Hallucinations: None (Denies hallucinations)  Ideas of Reference:Other (comment) (Denies PI)  Suicidal Thoughts:Suicidal Thoughts: No  Homicidal Thoughts:Homicidal Thoughts: No   Sensorium  Memory:Other (comment) (Unable to assess fully; patient poorly cooperative with interview)  Judgment:Other (comment) (Unable to assess)  Insight:Other (comment) (Unable to assess)   Solicitor (comment) (Unable to assess; patient poorly cooperative with interview)  Attention Span:Other (comment) (Unable to assess; patient poorly cooperative with  interview)  Recall:Other (comment) (Unable to assess; patient poorly cooperative with interview)  Fund of Knowledge:Other (comment) (Unable to assess; patient poorly cooperative with interview)  Language:Other (comment) (Unable to assess; patient poorly cooperative with interview)   Psychomotor Activity  Psychomotor Activity:Psychomotor Activity: Decreased   Assets  Assets:Other (comment) (Unable to assess; patient poorly cooperative with interview)   Sleep  Sleep:Sleep: Good Number of Hours of Sleep: 6.25    Physical Exam: Physical Exam Vitals and nursing note reviewed.  Constitutional:      General: Emily [name] is not in acute distress.    Appearance: Emily [name] is not diaphoretic.  HENT:     Head: Normocephalic.  Cardiovascular:     Rate and Rhythm: Normal rate.  Pulmonary:     Effort: Pulmonary effort is normal.  Neurological:     General: No focal deficit present.     Mental Status: Emily [name] is alert.   Review of Systems  Unable to perform ROS: Other (Patient poorly cooperative with interview and declines to participate in review of systems questions)  Blood pressure 111/83, pulse 83, temperature 97.8 F (36.6 C), temperature source Oral,  resp. rate 16, height 5' 3"  (1.6 m), weight 46.7 kg, SpO2 98 %. Body mass index is 18.25 kg/m.   Treatment Plan Summary: Diagnosis remains unclear as patient was unwilling to participate fully in evaluation today.  [place] diagnosis includes substance-induced mood and psychotic disorders versus schizoaffective disorder versus major depressive disorder with psychotic features. Daily contact with patient to assess and evaluate symptoms and progress in treatment and Medication management  Continue every 15-minute safety checks  Encourage participation in group therapy and therapeutic milieu  Depression -Continue sertraline and increase to 50 mg daily starting 04/05/2021  Psychosis -Continue Abilify 5 mg daily  Substance use disorder -Continue COWS protocol with comfort meds to cover for withdrawal from opiates -Continue CIWA protocol with PRN lorazepam and comfort meds to cover for possible withdrawal from alcohol or benzodiazepines  Insomnia -Continue trazodone 50 mg QHS PRN  Anxiety -Continue hydroxyzine 15m Q6H PRN -Continue Neurontin 100 mg 3 times daily  Available lab results reviewed.  EKG results reviewed from 04/02/2021 which showed normal sinus rhythm with ventricular rate of 61 and QT/QTc of 448/450.  Patient [facility] to [facility]  Discharge planning in progress  MArthor Captain MD 04/04/2021, 5:41 PM

## 2021-04-04 NOTE — Tx Team (Signed)
Interdisciplinary Treatment and Diagnostic Plan Update  04/04/2021 Time of Session: 9:30am Emily [name] MRN: [id number]  Principal Diagnosis: Schizophrenia spectrum disorder with psychotic disorder type not yet determined [facility])  Secondary Diagnoses: Principal Problem:   Schizophrenia spectrum disorder with psychotic disorder type not yet determined ([place])   Current Medications:  Current Facility-Administered Medications  Medication Dose Route Frequency Provider Last Rate Last Admin   acetaminophen (TYLENOL) tablet 650 mg  650 mg Oral Q6H PRN Nelda Marseille, Amy E, MD       alum & mag hydroxide-simeth (MAALOX/MYLANTA) 200-200-20 MG/5ML suspension 15 mL  15 mL Oral Q6H PRN Nelda Marseille, Amy E, MD       ARIPiprazole (ABILIFY) tablet 5 mg  5 mg Oral Daily Derrill Center, NP   5 mg at 04/04/21 0756   feeding supplement (ENSURE ENLIVE / ENSURE PLUS) liquid 237 mL  237 mL Oral BID BM Derrill Center, NP   237 mL at 04/04/21 0816   gabapentin (NEURONTIN) capsule 100 mg  100 mg Oral TID Harlow Asa, MD   100 mg at 04/04/21 0756   hydrOXYzine (ATARAX/VISTARIL) tablet 25 mg  25 mg Oral Q4H PRN Derrill Center, NP   25 mg at 04/03/21 2116   loperamide (IMODIUM) capsule 2-4 mg  2-4 mg Oral PRN Harlow Asa, MD       LORazepam (ATIVAN) tablet 1 mg  1 mg Oral Q6H PRN Nelda Marseille, Amy E, MD       magnesium hydroxide (MILK OF MAGNESIA) suspension 5 mL  5 mL Oral Daily PRN Harlow Asa, MD       multivitamin with minerals tablet 1 tablet  1 tablet Oral Daily Viann Fish E, MD   1 tablet at 04/04/21 0758   ondansetron (ZOFRAN-ODT) disintegrating tablet 4 mg  4 mg Oral Q6H PRN Harlow Asa, MD       pantoprazole (PROTONIX) EC tablet 40 mg  40 mg Oral Daily Nelda Marseille, Amy E, MD   40 mg at 04/04/21 0757   sertraline (ZOLOFT) tablet 25 mg  25 mg Oral Daily Derrill Center, NP   25 mg at 04/04/21 0756   thiamine tablet 100 mg  100 mg Oral Daily Nelda Marseille, Amy E, MD   100 mg at 04/04/21 0756    traZODone (DESYREL) tablet 50 mg  50 mg Oral QHS PRN Derrill Center, NP   50 mg at 04/03/21 2116   PTA Medications: Medications Prior to Admission  Medication Sig Dispense Refill Last Dose   OLANZapine (ZYPREXA) 7.5 MG tablet Take 1 tablet (7.5 mg total) by mouth at bedtime. (Patient not taking: No sig reported) 30 tablet 0 Not Taking   traZODone (DESYREL) 100 MG tablet Take 1 tablet (100 mg total) by mouth at bedtime as needed for sleep. (Patient not taking: No sig reported) 30 tablet 0 Not Taking    Patient Stressors: Health problems Medication change or noncompliance Substance abuse Other: Homeless  Patient Strengths: General fund of knowledge Other: Ability to seek out services when needed  Treatment Modalities: Medication Management, Group therapy, Case management,  1 to 1 session with clinician, Psychoeducation, Recreational therapy.   Physician Treatment Plan for Primary Diagnosis: Schizophrenia spectrum disorder with psychotic disorder type not yet determined ([place]) Long Term Goal(s): Improvement in symptoms so as ready for discharge   Short Term Goals: Ability to identify changes in lifestyle to reduce recurrence of condition will improve Ability to verbalize feelings will improve Ability to identify and  develop effective coping behaviors will improve Compliance with prescribed medications will improve Ability to identify triggers associated with substance abuse/mental health issues will improve Ability to disclose and discuss suicidal ideas Ability to maintain clinical measurements within normal limits will improve  Medication Management: Evaluate patient's response, side effects, and tolerance of medication regimen.  Therapeutic Interventions: 1 to 1 sessions, Unit Group sessions and Medication administration.  Evaluation of Outcomes: Not Met  Physician Treatment Plan for Secondary Diagnosis: Principal Problem:   Schizophrenia spectrum disorder with psychotic  disorder type not yet determined ([place])  Long Term Goal(s): Improvement in symptoms so as ready for discharge   Short Term Goals: Ability to identify changes in lifestyle to reduce recurrence of condition will improve Ability to verbalize feelings will improve Ability to identify and develop effective coping behaviors will improve Compliance with prescribed medications will improve Ability to identify triggers associated with substance abuse/mental health issues will improve Ability to disclose and discuss suicidal ideas Ability to maintain clinical measurements within normal limits will improve     Medication Management: Evaluate patient's response, side effects, and tolerance of medication regimen.  Therapeutic Interventions: 1 to 1 sessions, Unit Group sessions and Medication administration.  Evaluation of Outcomes: Not Met   RN Treatment Plan for Primary Diagnosis: Schizophrenia spectrum disorder with psychotic disorder type not yet determined ([place]) Long Term Goal(s): Knowledge of disease and therapeutic regimen to maintain health will improve  Short Term Goals: Ability to remain free from injury will improve, Ability to verbalize frustration and anger appropriately will improve, Ability to verbalize feelings will improve, Ability to identify and develop effective coping behaviors will improve, and Compliance with prescribed medications will improve  Medication Management: RN will administer medications as ordered by provider, will assess and evaluate patient's response and provide education to patient for prescribed medication. RN will report any adverse and/or side effects to prescribing provider.  Therapeutic Interventions: 1 on 1 counseling sessions, Psychoeducation, Medication administration, Evaluate responses to treatment, Monitor vital signs and CBGs as ordered, Perform/monitor CIWA, COWS, AIMS and Fall Risk screenings as ordered, Perform wound care treatments as  ordered.  Evaluation of Outcomes: Not Met   LCSW Treatment Plan for Primary Diagnosis: Schizophrenia spectrum disorder with psychotic disorder type not yet determined ([place]) Long Term Goal(s): Safe transition to appropriate next level of care at discharge, Engage patient in therapeutic group addressing interpersonal concerns.  Short Term Goals: Engage patient in aftercare planning with referrals and resources, Increase social support, Increase ability to appropriately verbalize feelings, Facilitate patient progression through stages of change regarding substance use diagnoses and concerns, Identify triggers associated with mental health/substance abuse issues, and Increase skills for wellness and recovery  Therapeutic Interventions: Assess for all discharge needs, 1 to 1 time with Social worker, Explore available resources and support systems, Assess for adequacy in community support network, Educate family and significant other(s) on suicide prevention, Complete Psychosocial Assessment, Interpersonal group therapy.  Evaluation of Outcomes: Not Met   Progress in Treatment: Attending groups: No. Participating in groups: No. Taking medication as prescribed: Yes. Toleration medication: Yes. Family/Significant other contact made: No, will contact:  declined consents Patient understands diagnosis: Yes. Discussing patient identified problems/goals with staff: Yes. Medical problems stabilized or resolved: Yes. Denies suicidal/homicidal ideation: Yes. Issues/concerns per patient self-inventory: No.   New problem(s) identified: No, Describe:  none  New Short Term/Long Term Goal(s): detox, medication management for mood stabilization; elimination of SI thoughts; development of comprehensive mental wellness/sobriety plan  Patient Goals:  Did  not attend  Discharge Plan or Barriers: Patient recently admitted. CSW will continue to follow and assess for appropriate referrals and possible discharge  planning.    Reason for Continuation of Hospitalization: Depression Medication stabilization Suicidal ideation  Estimated Length of Stay: 3-5 days  Attendees: Patient: Did not attend 04/04/2021   Physician:  04/04/2021   Nursing:  04/04/2021   RN Care Manager: 04/04/2021  Social Worker: Darletta Moll, LCSW 04/04/2021   Recreational Therapist:  04/04/2021   Other:  04/04/2021  Other:  04/04/2021   Other: 04/04/2021     Scribe for Treatment Team: Vassie Moselle, LCSW 04/04/2021 9:31 AM

## 2021-04-04 NOTE — [facility] Group Notes (Signed)
Type of Therapy and Topic:  Group Therapy - Healthy vs Unhealthy Coping Skills   Participation Level: Active   Description of Group The focus of this group was to determine what unhealthy coping techniques typically are used by group members and what healthy coping techniques would be helpful in coping with various problems. Patients were guided in becoming aware of the differences between healthy and unhealthy coping techniques. Patients were asked to identify 2-3 healthy coping skills they would like to learn to use more effectively.   Therapeutic Goals 1. Patients learned that coping is what human beings do all day long to deal with various situations in their lives 2. Patients defined and discussed healthy vs unhealthy coping techniques 3. Patients identified their preferred coping techniques and identified whether these were healthy or unhealthy 4. Patients determined 2-3 healthy coping skills they would like to become more familiar with and use more often. 5. Patients provided support and ideas to each other     Summary of Patient Progress: Due to the acuity and complex discharge plans, group was not held. Patient was provided therapeutic worksheets and asked to meet with CSW as needed.  Alison Kubicki, LCSWA Clinicial Social Worker [facility]  

## 2021-04-05 ENCOUNTER — Ambulatory Visit (HOSPITAL_COMMUNITY)
Admit: 2021-04-05 | Discharge: 2021-04-05 | Disposition: A | Discharge status: Home / Self Care | Payer: Federal, State, Local not specified - Other | Attending: Behavioral Health | Admitting: Behavioral Health

## 2021-04-05 DIAGNOSIS — F29 Unspecified psychosis not due to a substance or known physiological condition: Secondary | ICD-10-CM | POA: Diagnosis not present

## 2021-04-05 DIAGNOSIS — R4182 Altered mental status, unspecified: Secondary | ICD-10-CM | POA: Insufficient documentation

## 2021-04-05 DIAGNOSIS — J323 Chronic sphenoidal sinusitis: Secondary | ICD-10-CM | POA: Insufficient documentation

## 2021-04-05 LAB — CBC WITH DIFFERENTIAL/PLATELET
Abs Immature Granulocytes: 0.02 10*3/uL (ref 0.00–0.07)
Basophils Absolute: 0 10*3/uL (ref 0.0–0.1)
Basophils Relative: 0 %
Eosinophils Absolute: 0.1 10*3/uL (ref 0.0–0.5)
Eosinophils Relative: 1 %
HCT: 38.8 % (ref 36.0–46.0)
Hemoglobin: 12.5 g/dL (ref 12.0–15.0)
Immature Granulocytes: 0 %
Lymphocytes Relative: 37 %
Lymphs Abs: 2.4 10*3/uL (ref 0.7–4.0)
MCH: 31.3 pg (ref 26.0–34.0)
MCHC: 32.2 g/dL (ref 30.0–36.0)
MCV: 97.2 fL (ref 80.0–100.0)
Monocytes Absolute: 0.6 10*3/uL (ref 0.1–1.0)
Monocytes Relative: 9 %
Neutro Abs: 3.4 10*3/uL (ref 1.7–7.7)
Neutrophils Relative %: 53 %
Platelets: 238 10*3/uL (ref 150–400)
RBC: 3.99 MIL/uL (ref 3.87–5.11)
RDW: 13.1 % (ref 11.5–15.5)
WBC: 6.4 10*3/uL (ref 4.0–10.5)
nRBC: 0 % (ref 0.0–0.2)

## 2021-04-05 LAB — COMPREHENSIVE METABOLIC PANEL
ALT: 27 U/L (ref 0–44)
AST: 27 U/L (ref 15–41)
Albumin: 3.7 g/dL (ref 3.5–5.0)
Alkaline Phosphatase: 38 U/L (ref 38–126)
Anion gap: 6 (ref 5–15)
BUN: 13 mg/dL (ref 6–20)
CO2: 25 mmol/L (ref 22–32)
Calcium: 8.9 mg/dL (ref 8.9–10.3)
Chloride: 106 mmol/L (ref 98–111)
Creatinine, Ser: 0.65 mg/dL (ref 0.44–1.00)
GFR, Estimated: >60 mL/min (ref >60)
Glucose, Bld: 89 mg/dL (ref 70–99)
Potassium: 3.9 mmol/L (ref 3.5–5.1)
Sodium: 137 mmol/L (ref 135–145)
Total Bilirubin: 0.4 mg/dL (ref 0.3–1.2)
Total Protein: 6.7 g/dL (ref 6.5–8.1)

## 2021-04-05 LAB — TSH: TSH: 1.331 u[IU]/mL (ref 0.350–4.500)

## 2021-04-05 LAB — RAPID HIV SCREEN (HIV 1/2 AB+AG)
HIV 1/2 Antibodies: NONREACTIVE
HIV-1 P24 Antigen - HIV24: NONREACTIVE

## 2021-04-05 LAB — VITAMIN B12: Vitamin B-12: 274 pg/mL (ref 180–914)

## 2021-04-05 LAB — AMMONIA: Ammonia: 43 umol/L — ABNORMAL HIGH (ref 9–35)

## 2021-04-05 LAB — HIV ANTIBODY (ROUTINE TESTING W REFLEX): HIV Screen 4th Generation wRfx: NONREACTIVE

## 2021-04-05 LAB — SEDIMENTATION RATE: Sed Rate: 8 mm/hr (ref 0–22)

## 2021-04-05 MED ORDER — HALOPERIDOL 1 MG PO TABS
1.0000 mg | ORAL_TABLET | Freq: Two times a day (BID) | ORAL | Status: DC
  Administered 2021-04-05 – 2021-04-11 (×13): 1 mg via ORAL
  Filled 2021-04-05 (×2): qty 1
  Filled 2021-04-05: qty 14
  Filled 2021-04-05 (×5): qty 1
  Filled 2021-04-05: qty 14
  Filled 2021-04-05 (×11): qty 1

## 2021-04-05 NOTE — Progress Notes (Signed)
Adult Psychoeducational Group Note  Date:  04/05/2021 Time:  2:54 PM  Group Topic/Focus:  Healthy Communication:   The focus of this group is to discuss communication, barriers to communication, as well as healthy ways to communicate with others.  Participation Level:  Did Not Attend Emily [name] 04/05/2021, 2:54 PM

## 2021-04-05 NOTE — Plan of Care (Signed)
Nurse discussed anxiety, depression and coping skills with patient.  

## 2021-04-05 NOTE — Progress Notes (Signed)
   04/04/21 2234  Psych Admission Type (Psych Patients Only)  Admission Status Voluntary  Psychosocial Assessment  Patient Complaints Anxiety;Depression  Eye Contact Brief  Facial Expression Other (Comment) (appropriate)  Affect Appropriate to circumstance  Speech Unremarkable  Interaction Minimal  Motor Activity Other (Comment) (WDL)  Appearance/Hygiene In hospital gown  Behavior Characteristics Anxious  Mood Depressed  Thought Process  Coherency WDL  Content WDL  Delusions None reported or observed  Perception WDL  Hallucination None reported or observed  Judgment Impaired  Confusion None  Danger to Self  Current suicidal ideation? Denies  Danger to Others  Danger to Others None reported or observed

## 2021-04-05 NOTE — Progress Notes (Signed)
[facility] MD Progress Note  04/05/2021 3:27 PM Emily [name]  MRN:  [id number]  Reason for admission:  Emily [name] is a 35 year old female with a history of substance abuse, schizophrenia and bipolar disorder admitted voluntarily from [place] ED for suicidal ideation without a plan and worsening depression and anxiety.  She also endorsed auditory hallucinations on presentation to the ED.  Objective: Medical record reviewed.  Patient's case discussed in detail with members of the treatment team.  I met with and evaluated the patient on the unit today for follow-up.  Patient is better engaged in our interaction today and maintains good eye contact.  She is edentulous and speech is poorly articulated, garbled and difficult to understand at times.  The patient also reports problems with intermittent confusion and is a suboptimal historian.  Thought processes are vague and poorly organized at times.  She states that she came to the hospital because she had suicidal thoughts and "to get my stuff together."  She denies wish for death, thoughts of harming herself or suicidal ideation today.  She denies AI, HI, AH, VH, PI, or first rank symptoms.  Patient is able to state that she was using alcohol and pills for about 2 weeks prior to admission.  She is unable to state what kind of pills she was taking although yesterday she told me she was taking pain pills.  Urine drug screen in [place] ED was negative.  Patient reports diarrhea which she attributes to withdrawal symptoms.  There is no evidence of rhinorrhea, lacrimation, diaphoresis, tremor piloerection on exam.  She denies other withdrawal symptoms or physical problems except for chronic back pain and neuropathy in her feet.  She denies weakness, problems walking.  She states she has been confused for about 2 weeks.  She is able to register 3 of 3 objects but only able to recall 1 of 3 objects in 5 minutes.  The patient is unable to perform serial 7s or serial 3s  and seems to misunderstand or be confused about the instructions.  She is able to state the days of the week in reverse without error.  She is not able to perform 3 step commands and only is able to follow 2 of 3 steps.  She denies any history of head injury or seizure.  Patient is unable to state the street where she resides and states there is no person who can be contacted for collateral.  She is fully oriented to month, year, date, city and hospital.  She is able to tell me her birthday but states that she is 39 instead of her actual age of 65.  The patient consents to HIV and other STI testing.  She reports that she is eating well.  She had some trouble sleeping last night.  Staff document the patient slept 3.5 hours last night.  Vital signs are stable and within normal limits.  She is afebrile.  She has not been scoring on CIWA.  Patient has not been attending most groups.  Principal Problem: Schizophrenia spectrum disorder with psychotic disorder type not yet determined ([place]) Diagnosis: Principal Problem:   Schizophrenia spectrum disorder with psychotic disorder type not yet determined ([place])  Total Time spent with patient: 25 minutes  Past Psychiatric History: See admission H&P  Past Medical History:  Past Medical History:  Diagnosis Date   Anxiety    Anxiety    Depression    History reviewed. No pertinent surgical history. Family History: History reviewed. No pertinent  family history. Family Psychiatric  History: See admission H&P Social History:  Social History   Substance and Sexual Activity  Alcohol Use Yes   Comment: 2-3 drinks but none recently per pt report     Social History   Substance and Sexual Activity  Drug Use No    Social History   Socioeconomic History   Marital status: Single    Spouse name: Not on file   Number of children: Not on file   Years of education: Not on file   Highest education level: Not on file  Occupational History   Not on file  Tobacco  Use   Smoking status: Every Day    Pack years: 0.00    Passive exposure: Past   Smokeless tobacco: Never  Substance and Sexual Activity   Alcohol use: Yes    Comment: 2-3 drinks but none recently per pt report   Drug use: No   Sexual activity: Not Currently  Other Topics Concern   Not on file  Social History Narrative   Not on file   Social Determinants of Health   Financial Resource Strain: Not on file  Food Insecurity: Not on file  Transportation Needs: Not on file  Physical Activity: Not on file  Stress: Not on file  Social Connections: Not on file   Additional Social History:                         Sleep: Poor  Appetite:  Good  Current Medications: Current Facility-Administered Medications  Medication Dose Route Frequency Provider Last Rate Last Admin   acetaminophen (TYLENOL) tablet 650 mg  650 mg Oral Q6H PRN Nelda Marseille, Amy E, MD       alum & mag hydroxide-simeth (MAALOX/MYLANTA) 200-200-20 MG/5ML suspension 15 mL  15 mL Oral Q6H PRN Nelda Marseille, Amy E, MD       feeding supplement (ENSURE ENLIVE / ENSURE PLUS) liquid 237 mL  237 mL Oral BID BM Derrill Center, NP   237 mL at 04/05/21 1100   haloperidol (HALDOL) tablet 1 mg  1 mg Oral BID Arthor Captain, MD   1 mg at 04/05/21 0900   hydrOXYzine (ATARAX/VISTARIL) tablet 25 mg  25 mg Oral Q6H PRN Arthor Captain, MD   25 mg at 04/04/21 2145   loperamide (IMODIUM) capsule 2-4 mg  2-4 mg Oral PRN Harlow Asa, MD       LORazepam (ATIVAN) tablet 1 mg  1 mg Oral Q6H PRN Nelda Marseille, Amy E, MD       magnesium hydroxide (MILK OF MAGNESIA) suspension 5 mL  5 mL Oral Daily PRN Harlow Asa, MD       multivitamin with minerals tablet 1 tablet  1 tablet Oral Daily Nelda Marseille, Amy E, MD   1 tablet at 04/05/21 0818   ondansetron (ZOFRAN-ODT) disintegrating tablet 4 mg  4 mg Oral Q6H PRN Harlow Asa, MD       pantoprazole (PROTONIX) EC tablet 40 mg  40 mg Oral Daily Nelda Marseille, Amy E, MD   40 mg at 04/05/21 0818    sertraline (ZOLOFT) tablet 50 mg  50 mg Oral Daily Arthor Captain, MD   50 mg at 04/05/21 0818   thiamine tablet 100 mg  100 mg Oral Daily Viann Fish E, MD   100 mg at 04/05/21 0817   traZODone (DESYREL) tablet 50 mg  50 mg Oral QHS PRN Derrill Center, NP  50 mg at 04/04/21 2145    Lab Results:  Results for orders placed or performed during the hospital encounter of 04/02/21 (from the past 48 hour(s))  TSH     Status: None   Collection Time: 04/04/21  6:22 AM  Result Value Ref Range   TSH 0.482 0.350 - 4.500 uIU/mL    Comment: Performed by a 3rd Generation assay with a functional sensitivity of <=0.01 uIU/mL. Performed at [facility], [place] [address] [place], [place] [address]   Hemoglobin A1c     Status: Abnormal   Collection Time: 04/04/21  6:22 AM  Result Value Ref Range   Hgb A1c MFr Bld 4.5 (L) 4.8 - 5.6 %    Comment: (NOTE) Pre diabetes:          5.7%-6.4%  Diabetes:              >6.4%  Glycemic control for   <7.0% adults with diabetes    Mean Plasma Glucose 82.45 mg/dL    Comment: Performed at [facility] [address], [place], [place] [address]  Lipid panel     Status: Abnormal   Collection Time: 04/04/21  6:22 AM  Result Value Ref Range   Cholesterol 180 0 - 200 mg/dL   Triglycerides 62 <150 mg/dL   HDL 64 >40 mg/dL   Total CHOL/HDL Ratio 2.8 RATIO   VLDL 12 0 - 40 mg/dL   LDL Cholesterol 104 (H) 0 - 99 mg/dL    Comment:        Total Cholesterol/HDL:CHD Risk Coronary Heart Disease Risk Table                     Men   Women  1/2 Average Risk   3.4   3.3  Average Risk       5.0   4.4  2 X Average Risk   9.6   7.1  3 X Average Risk  23.4   11.0        Use the calculated Patient Ratio above and the CHD Risk Table to determine the patient's CHD Risk.        ATP III CLASSIFICATION (LDL):  <100     mg/dL   Optimal  100-129  mg/dL   Near or Above                    Optimal  130-159  mg/dL   Borderline  160-189  mg/dL    High  >190     mg/dL   Very High Performed at [facility] [address], [place], [place] [address]   Urinalysis, Complete w Microscopic Urine, Clean Catch     Status: Abnormal   Collection Time: 04/04/21  6:47 AM  Result Value Ref Range   Color, Urine YELLOW YELLOW   APPearance HAZY (A) CLEAR   Specific Gravity, Urine 1.025 1.005 - 1.030   pH 7.0 5.0 - 8.0   Glucose, UA NEGATIVE NEGATIVE mg/dL   Hgb urine dipstick NEGATIVE NEGATIVE   Bilirubin Urine NEGATIVE NEGATIVE   Ketones, ur NEGATIVE NEGATIVE mg/dL   Protein, ur NEGATIVE NEGATIVE mg/dL   Nitrite NEGATIVE NEGATIVE   Leukocytes,Ua NEGATIVE NEGATIVE   RBC / HPF 0-5 0 - 5 RBC/hpf   WBC, UA 0-5 0 - 5 WBC/hpf   Bacteria, UA FEW (A) NONE SEEN   Squamous Epithelial / LPF 6-10 0 - 5   Mucus PRESENT  Comment: Performed at [facility], [place] [address], [place], [place] [address]    Blood Alcohol level:  Lab Results  Component Value Date   ETH <5 27/74/1287    Metabolic Disorder Labs: Lab Results  Component Value Date   HGBA1C 4.5 (L) 04/04/2021   MPG 82.45 04/04/2021   MPG 82.45 04/02/2021   Lab Results  Component Value Date   PROLACTIN 3.5 (L) 04/02/2021   Lab Results  Component Value Date   CHOL 180 04/04/2021   TRIG 62 04/04/2021   HDL 64 04/04/2021   CHOLHDL 2.8 04/04/2021   VLDL 12 04/04/2021   LDLCALC 104 (H) 04/04/2021    Physical Findings: AIMS: Facial and Oral Movements Muscles of Facial Expression: None, normal Lips and Perioral Area: None, normal Jaw: None, normal Tongue: None, normal,Extremity Movements Upper (arms, wrists, hands, fingers): None, normal Lower (legs, knees, ankles, toes): None, normal, Trunk Movements Neck, shoulders, hips: None, normal, Overall Severity Severity of abnormal movements (highest score from questions above): None, normal Incapacitation due to abnormal movements: None, normal Patient's awareness of abnormal movements (rate  only patient's report): No Awareness, Dental Status Current problems with teeth and/or dentures?: Yes Does patient usually wear dentures?: Yes  CIWA:  CIWA-Ar Total: 1 COWS:  COWS Total Score: 1  Musculoskeletal: Strength & Muscle Tone: within normal limits Gait & Station: normal Patient leans: N/A  Psychiatric Specialty Exam:  Presentation  General Appearance: Fairly Groomed  Eye Contact:Good  Speech:Garbled; Slow; Other (comment) (Edentulous and articulation is poor)  Speech Volume:Normal  Handedness:Right   Mood and Affect  Mood:Depressed  Affect:Blunt   Thought Process  Thought Processes:Disorganized; Other (comment) (Intermittently disorganized and confused)  Descriptions of Associations:Loose  Orientation:Full (Time, Place and Person) (Not oriented to age.  Unable to state name of street where she lives.)  Thought Content:Scattered  History of Schizophrenia/Schizoaffective disorder:No data recorded Duration of Psychotic Symptoms:No data recorded Hallucinations:Hallucinations: None  Ideas of Reference:None  Suicidal Thoughts:Suicidal Thoughts: No  Homicidal Thoughts:Homicidal Thoughts: No   Sensorium  Memory:Immediate Poor; Recent Poor  Judgment:Impaired  Insight:Shallow   Executive Functions  Concentration:Fair  Attention Span:Fair  Recall:Poor  Fund of Knowledge:Poor  Language:Fair   Psychomotor Activity  Psychomotor Activity:Psychomotor Activity: Normal   Assets  Assets:Desire for Improvement   Sleep  Sleep:Sleep: Poor Number of Hours of Sleep: 3.5    Physical Exam: Physical Exam Vitals and nursing note reviewed.  Constitutional:      General: She is not in acute distress.    Appearance: She is not diaphoretic.  HENT:     Head: Normocephalic.  Cardiovascular:     Rate and Rhythm: Normal rate.  Pulmonary:     Effort: Pulmonary effort is normal.  Neurological:     General: No focal deficit present.     Mental  Status: She is alert.   Review of Systems  Constitutional:  Negative for chills, diaphoresis and fever.  HENT:  Negative for sore throat.   Eyes: Negative.   Respiratory:  Negative for cough and shortness of breath.   Cardiovascular:  Negative for chest pain and palpitations.  Gastrointestinal:  Positive for diarrhea. Negative for constipation, nausea and vomiting.  Genitourinary: Negative.   Musculoskeletal:  Positive for back pain.       Positive for chronic back pain  Skin:  Negative for rash.  Neurological:  Negative for dizziness, tremors, focal weakness, seizures, weakness and headaches.       Positive for neuropathy in feet  Psychiatric/Behavioral:  Positive for depression, memory loss and substance abuse. Negative for hallucinations and suicidal ideas. The patient has insomnia.   Blood pressure 117/76, pulse 84, temperature 97.8 F (36.6 C), temperature source Oral, resp. rate 16, height 5' 3" (1.6 m), weight 46.7 kg, SpO2 100 %. Body mass index is 18.25 kg/m.   Treatment Plan Summary: Patient is more alert today and better able to participate in interview.  She presents with intermittent confusion, problems with memory, language, calculations, word finding difficulties, problems with concentration and attention.  HCV antibody was reactive.  Consider possible hepatic encephalopathy.  Have ordered additional lab work for evening draw and head CT to further assess etiology of cognitive impairments. Daily contact with patient to assess and evaluate symptoms and progress in treatment and Medication management  Continue every 15-minute safety checks  Encourage participation in group therapy and therapeutic milieu  Altered mental status/psychosis -Discontinue Abilify 5 mg daily -Haloperidol 1 mg twice daily.  Consider further upward titration. -Head CT has been ordered -Lab work ordered for evening draw includes ammonia level, HCV RNA quant, HIV, RPR, TFTs, B12 level, sed rate,  ANA, repeat CBC and repeat CMP.  Depression -Will discontinue sertraline for now  Substance use disorder -Continue COWS protocol with comfort meds to cover for withdrawal from opiates -Continue CIWA protocol with PRN lorazepam and comfort meds to cover for possible withdrawal from alcohol or benzodiazepines  Insomnia -Continue trazodone 50 mg QHS PRN  Anxiety -Discontinue hydroxyzine 31m Q6H PRN -Discontinue Neurontin 100 mg 3 times daily  Discharge planning in progress  MArthor Captain MD 04/05/2021, 3:27 PM

## 2021-04-05 NOTE — [facility] Group Notes (Signed)
Patient did not attend group   ADULT GRIEF GROUP NOTE:   Spiritual care group on grief and loss facilitated by chaplain Katy [employer], [facility]   Group Goal:   Support / Education around grief and loss   Members engage in facilitated group support and psycho-social education.   Group Description:   Following introductions and group rules, group members engaged in facilitated group dialog and support around topic of loss, with particular support around experiences of loss in their lives. Group Identified types of loss (relationships / self / things) and identified patterns, circumstances, and changes that precipitate losses. Reflected on thoughts / feelings around loss, normalized grief responses, and recognized variety in grief experience. Group noted Worden's four tasks of grief in discussion.   Group drew on Adlerian / Rogerian, narrative, MI,    

## 2021-04-05 NOTE — Progress Notes (Signed)
Adult Psychoeducational Group Note  Date:  04/05/2021 Time:  1:35 PM  Group Topic/Focus:  Goals Group:   The focus of this group is to help patients establish daily goals to achieve during treatment and discuss how the patient can incorporate goal setting into their daily lives to aide in recovery.  Participation Level:  Did Not Attend Margaret Pyle 04/05/2021, 1:35 PM

## 2021-04-05 NOTE — Progress Notes (Signed)
Recreation Therapy Notes  Animal-Assisted Activity (AAA) Program Checklist/Progress Notes Patient Eligibility Criteria Checklist & Daily Group note for Rec Tx Intervention  Date: 7.12.22 Time: 1420 Location: [address]  AAA/T Program Assumption of Risk Form signed by Engineer, production or Parent Legal Guardian  YES   Patient is free of allergies or severe asthma  YES  Patient reports no fear of animals  YES  Patient reports no history of cruelty to animals YES  Patient understands his/her participation is voluntary  YES  Patient washes hands before animal contact  YES  Patient washes hands after animal contact  YES  Behavioral Response: Attentive  Education: Charity fundraiser, Appropriate Animal Interaction   Education Outcome: Acknowledges understanding/In group clarification offered/Needs additional education.   Clinical Observations/Feedback:  Pt attended and observed during group activity.     Caroll Rancher, LRT/CTRS   Caroll Rancher A 04/05/2021 3:11 PM

## 2021-04-06 DIAGNOSIS — F29 Unspecified psychosis not due to a substance or known physiological condition: Secondary | ICD-10-CM | POA: Diagnosis not present

## 2021-04-06 DIAGNOSIS — F1910 Other psychoactive substance abuse, uncomplicated: Secondary | ICD-10-CM | POA: Diagnosis present

## 2021-04-06 LAB — T4, FREE: Free T4: 0.96 ng/dL (ref 0.61–1.12)

## 2021-04-06 LAB — ANA W/REFLEX IF POSITIVE: Anti Nuclear Antibody (ANA): NEGATIVE

## 2021-04-06 LAB — RPR: RPR Ser Ql: NONREACTIVE

## 2021-04-06 MED ORDER — NICOTINE 14 MG/24HR TD PT24
MEDICATED_PATCH | TRANSDERMAL | Status: AC
  Filled 2021-04-06: qty 1

## 2021-04-06 MED ORDER — NICOTINE 14 MG/24HR TD PT24
14.0000 mg | MEDICATED_PATCH | Freq: Every day | TRANSDERMAL | Status: DC
  Administered 2021-04-06 – 2021-04-10 (×4): 14 mg via TRANSDERMAL
  Filled 2021-04-06 (×7): qty 1

## 2021-04-06 NOTE — [facility] Group Notes (Signed)
LCSW Group Therapy Note   04/06/2021    Type of Therapy and Topic:  Group Therapy:  Positive Affirmations   Participation Level:  Active  Description of Group: This group addressed positive affirmation toward self and others. Patients went around the room and identified two positive things about themselves and two positive things about a peer in the room. Patients reflected on how it felt to share something positive with others, to identify positive things about themselves, and to hear positive things from others. Patients were encouraged to have a daily reflection of positive characteristics or circumstances.  Therapeutic Goals Patient will verbalize two of their positive qualities Patient will demonstrate empathy for others by stating two positive qualities about a peer in the group Patient will verbalize their feelings when voicing positive self affirmations and when voicing positive affirmations of others Patients will discuss the potential positive impact on their wellness/recovery of focusing on positive traits of self and others. Summary of Patient Progress:Patient was provided therapeutic worksheets and asked to meet with CSW as needed.    Therapeutic Modalities Cognitive Behavioral Therapy Motivational Interviewing  Emily [name] 04/06/2021 1:27 PM

## 2021-04-06 NOTE — Progress Notes (Signed)
Psychoeducational Group Note  Date:  04/06/2021 Time:  1710  Group Topic/Focus:  Self Esteem Action Plan:   The focus of this group is to help patients create a plan to continue to build self-esteem after discharge.  Participation Level: Did Not Attend  Participation Quality:  Not Applicable  Affect:  Not Applicable  Cognitive:  Not Applicable  Insight:  Not Applicable  Engagement in Group: Not Applicable  Additional Comments:  Pt was asleep and could not attend the group session  Neymar Dowe E 04/06/2021, 6:24 PM

## 2021-04-06 NOTE — Progress Notes (Addendum)
  Pt presents with suspiciousness and paranoia.  Pt is guarded and will not participate in conversation with RN.  Pt denied SI/HI/AVH.  Pt alerts nurse at 1815 that she has a blister on the bottom of her right foot and said, "I need a safety pin to pop it open."  RN told pt to not pick at her blister and RN went to charge RN for guidance.  This RN quickly returned to pt.  While RN talked to charge nurse, Pt took a pencil and pierced her foot and squeezed out pus on her right foot. RN instructed pt to wash her foot and RN cleaned area with alcohol and covered with bandaid.  RN told [facility] who said for this nurse to inform  night nurse of what happened so night nurse could notify NP on duty to look at pt's foot.  RN told pt not to do anything to her foot and to not pick at it.  Pt said "Oh it's good now, I do this all the time."     04/06/21 0815  Psych Admission Type (Psych Patients Only)  Admission Status Voluntary  Psychosocial Assessment  Patient Complaints Suspiciousness;Irritability;Anxiety  Eye Contact Brief  Facial Expression Worried;Flat  Affect Appropriate to circumstance  Speech Unremarkable  Interaction Minimal  Motor Activity Other (Comment) (WDL)  Appearance/Hygiene Disheveled  Behavior Characteristics Anxious;Fidgety  Mood Anxious;Depressed;Sad  Thought Process  Coherency WDL  Content WDL  Delusions None reported or observed  Perception WDL  Hallucination None reported or observed  Judgment Impaired  Confusion None  Danger to Self  Current suicidal ideation? Denies  Danger to Others  Danger to Others None reported or observed

## 2021-04-06 NOTE — [facility] Group Notes (Signed)
[facility] Group Notes:  (Nursing/MHT/Case Management/Adjunct)  Date:  04/06/2021  Time:  8:20 PM  Type of Therapy:  Group Therapy  Participation Level:  Minimal  Participation Quality:  Attentive  Affect:  Appropriate  Cognitive:  Appropriate  Insight:  Improving  Engagement in Group:  Developing/Improving  Modes of Intervention:  Discussion  Summary of Progress/Problems:  Lorita Officer 04/06/2021, 8:20 PM

## 2021-04-06 NOTE — [facility] Suicide Risk Assessment (Signed)
[facility] INPATIENT:  Family/Significant Other Suicide Prevention Education  Suicide Prevention Education:  Education Completed; Gene Hooper [phone] (Cousin) has been identified by the patient as the family member/significant other with whom the patient will be residing, and identified as the person(s) who will aid the patient in the event of a mental health crisis (suicidal ideations/suicide attempt).  With written consent from the patient, the family member/significant other has been provided the following suicide prevention education, prior to the and/or following the discharge of the patient.  The suicide prevention education provided includes the following: Suicide risk factors Suicide prevention and interventions National Suicide Hotline telephone number [facility] assessment telephone number [facility] Emergency Assistance 911 [facility] and/or Residential Mobile Crisis Unit telephone number  Request made of family/significant other to: Remove weapons (e.g., guns, rifles, knives), all items previously/currently identified as safety concern.   Remove drugs/medications (over-the-counter, prescriptions, illicit drugs), all items previously/currently identified as a safety concern.  The family member/significant other verbalizes understanding of the suicide prevention education information provided.  The family member/significant other agrees to remove the items of safety concern listed above.  CSW spoke with Mr. Hopper who states that this is at least the 10th time his cousin has been in a substance use treatment center or a mental health hospital.  He states that his cousin has been using substances for :many year" but is not sure what substances she is using.  He states that he has not spoken to his cousin in a year.  Mr. Alwyn Ren states that his cousin can come to his home for at least a week or two and then he will get her sister to come in and help with finding her  housing as well.  Mr. Alwyn Ren states that he works out of town sometimes and will be out of town this week until Sunday evening (04/10/21).  He states that he believes someone should be with his cousin at all times and would like to doctor to wait until he is home to discharge her.  Mr. Alwyn Ren states that he does have multiple firearms in the home but they are locked up and there are no other weapons in the home.  CSW completed SPE with Mr. Hopper.   Metro Kung Mousa Prout 04/06/2021, 2:26 PM

## 2021-04-06 NOTE — [facility] Group Notes (Signed)
The focus of this group is to help patients establish daily goals to achieve during treatment and discuss how the patient can incorporate goal setting into their daily lives to aide in recovery.  Pt did not attend group 

## 2021-04-06 NOTE — Progress Notes (Signed)
[facility] MD Progress Note  04/06/2021 4:44 PM [name]  MRN:  [id number]  Reason for admission:  Emily [name] is a 35 year old female with a history of substance abuse, schizophrenia and bipolar disorder admitted voluntarily from [facility] ED for suicidal ideation without a plan and worsening depression and anxiety.  She also endorsed auditory hallucinations on presentation to the ED.  Objective: Medical record reviewed.  Patient's case discussed in detail with members of the treatment team.  I met with and evaluated the patient on the unit today for follow-up.  Patient appears improved today and is alert, more organized and more engaged in our conversation.  Eye contact is better and speech is more easily understood since patient is wearing dentures today.  Patient states that she did not sleep well last night but did not take trazodone because she wanted to try to sleep on her own.  She reports her mood is intermittently down but she declines to resume treatment with Zoloft for her depression.  She denies passive wish for death, SI, AI, HI, AH, VH, PI or first rank psychotic symptoms.  She denies any significant worry.  She is fully oriented to month, year, date, [facility] and hospital.  Patient is able to tell me that she was living with her sister for about a month prior to admission in [place].  Patient denies any alcohol use or drug use prior to admission.  She states that she plans to stay with a friend after discharge and is hopeful for discharge soon.  She experiences occasional anxiety.  She denies any medication side effects or physical problems.  Patient denies awareness of her recent confusion or the difficulty she has with her memory or concentration during this hospitalization.  She is able to register 3 of 3 objects but can recall only 1 of 3 objects at 3 minutes.  Attention and concentration are better; she provides more responsive answers to questions and she is able to state the months  of the year in reverse slowly with 1 error.  Patient is able to follow three-step commands without error today.  She has no issues with naming.  I discussed with patient that her head CT performed yesterday was normal.  I also reviewed her available lab results with her from evening draw last night.  I discussed with the patient that review of past lab work indicates she tested positive for hepatitis C in the past, but patient denies any history of liver problems or past positive test for hepatitis C.  Patient seemed unconcerned with the results of her lab tests and imaging study.  Hours of sleep last night were not recorded by staff.  Patient had some orthostasis this morning with heart rate of 106 seated and 122 standing.  Repeat heart rate at 945 was 82.  Vital signs were otherwise WNL and the patient is afebrile.  Lab results from evening draw on 04/05/2021 include CMP which was WNL.  Ammonia level which was mildly elevated at 43.  Normal vitamin B12 level of 274.  CBC and differential which were WNL.  Sed rate was WNL at 8.  TFTs were WNL with TSH of 1.331 and free T4 of 0.96.  Antinuclear antibody was negative.  RPR was nonreactive.  HIV testing was negative.  Urine has been collected for [facility] and chlamydia but results are not yet available.  HCV RNA quant has been drawn but results are not yet available.  Head CT performed yesterday showed "unremarkable noncontrast  CT appearance of the brain.  No evidence of acute intracranial abnormality.  Left sphenoid sinusitis."  Principal Problem: Psychosis ([place]) Diagnosis: Principal Problem:   Psychosis ([place]) Active Problems:   Substance abuse ([place])  Total Time spent with patient: 30 minutes  Past Psychiatric History: See admission H&P  Past Medical History:  Past Medical History:  Diagnosis Date   Anxiety    Anxiety    Depression    History reviewed. No pertinent surgical history. Family History: History reviewed. No pertinent family history. Family  Psychiatric  History: See admission H&P Social History:  Social History   Substance and Sexual Activity  Alcohol Use Yes   Comment: 2-3 drinks but none recently per pt report     Social History   Substance and Sexual Activity  Drug Use No    Social History   Socioeconomic History   Marital status: Single    Spouse name: Not on file   Number of children: Not on file   Years of education: Not on file   Highest education level: Not on file  Occupational History   Not on file  Tobacco Use   Smoking status: Every Day    Pack years: 0.00    Passive exposure: Past   Smokeless tobacco: Never  Substance and Sexual Activity   Alcohol use: Yes    Comment: 2-3 drinks but none recently per pt report   Drug use: No   Sexual activity: Not Currently  Other Topics Concern   Not on file  Social History Narrative   Not on file   Social Determinants of Health   Financial Resource Strain: Not on file  Food Insecurity: Not on file  Transportation Needs: Not on file  Physical Activity: Not on file  Stress: Not on file  Social Connections: Not on file   Additional Social History:                         Sleep: Poor  Appetite:  Good  Current Medications: Current Facility-Administered Medications  Medication Dose Route Frequency Provider Last Rate Last Admin   acetaminophen (TYLENOL) tablet 650 mg  650 mg Oral Q6H PRN Nelda Marseille, Amy E, MD       alum & mag hydroxide-simeth (MAALOX/MYLANTA) 200-200-20 MG/5ML suspension 15 mL  15 mL Oral Q6H PRN Nelda Marseille, Amy E, MD       feeding supplement (ENSURE ENLIVE / ENSURE PLUS) liquid 237 mL  237 mL Oral BID BM Derrill Center, NP   237 mL at 04/06/21 0938   haloperidol (HALDOL) tablet 1 mg  1 mg Oral BID Arthor Captain, MD   1 mg at 04/06/21 6384   hydrOXYzine (ATARAX/VISTARIL) tablet 25 mg  25 mg Oral Q6H PRN Arthor Captain, MD   25 mg at 04/06/21 5364   magnesium hydroxide (MILK OF MAGNESIA) suspension 5 mL  5 mL Oral Daily  PRN Harlow Asa, MD       multivitamin with minerals tablet 1 tablet  1 tablet Oral Daily Viann Fish E, MD   1 tablet at 04/06/21 0807   nicotine (NICODERM CQ - dosed in mg/24 hours) 14 mg/24hr patch            nicotine (NICODERM CQ - dosed in mg/24 hours) patch 14 mg  14 mg Transdermal Daily Sharma Covert, MD   14 mg at 04/06/21 0810   pantoprazole (PROTONIX) EC tablet 40 mg  40 mg Oral Daily  Harlow Asa, MD   40 mg at 04/06/21 5027   thiamine tablet 100 mg  100 mg Oral Daily Harlow Asa, MD   100 mg at 04/06/21 7412   traZODone (DESYREL) tablet 50 mg  50 mg Oral QHS PRN Derrill Center, NP   50 mg at 04/04/21 2145    Lab Results:  Results for orders placed or performed during the hospital encounter of 04/02/21 (from the past 48 hour(s))  Comprehensive metabolic panel     Status: None   Collection Time: 04/05/21  6:32 PM  Result Value Ref Range   Sodium 137 135 - 145 mmol/L   Potassium 3.9 3.5 - 5.1 mmol/L   Chloride 106 98 - 111 mmol/L   CO2 25 22 - 32 mmol/L   Glucose, Bld 89 70 - 99 mg/dL    Comment: Glucose reference range applies only to samples taken after fasting for at least 8 hours.   BUN 13 6 - 20 mg/dL   Creatinine, Ser 0.65 0.44 - 1.00 mg/dL   Calcium 8.9 8.9 - 10.3 mg/dL   Total Protein 6.7 6.5 - 8.1 g/dL   Albumin 3.7 3.5 - 5.0 g/dL   AST 27 15 - 41 U/L   ALT 27 0 - 44 U/L   Alkaline Phosphatase 38 38 - 126 U/L   Total Bilirubin 0.4 0.3 - 1.2 mg/dL   GFR, Estimated >60 >60 mL/min    Comment: (NOTE) Calculated using the CKD-EPI Creatinine Equation (2021)    Anion gap 6 5 - 15    Comment: Performed at [facility], [place] [address], [place], [place] [address]  RPR     Status: None   Collection Time: 04/05/21  6:32 PM  Result Value Ref Range   RPR Ser Ql NON REACTIVE NON REACTIVE    Comment: Performed at [facility], [place] [address], [place], [place] [address]  HIV Antibody (routine testing w rflx)     Status: None    Collection Time: 04/05/21  6:32 PM  Result Value Ref Range   HIV Screen 4th Generation wRfx Non Reactive Non Reactive    Comment: Performed at [facility], [place] [address] [place], [place] [address]  Rapid HIV screen (HIV 1/2 Ab+Ag) ([facility] Only)     Status: None   Collection Time: 04/05/21  6:32 PM  Result Value Ref Range   HIV-1 P24 Antigen - HIV24 NON REACTIVE NON REACTIVE    Comment: RESULT CALLED TO, READ BACK BY AND VERIFIED WITH: Dina Rich. RN @2020  04/05/21 KELLY, J. (NOTE) Detection of p24 may be inhibited by biotin in the sample, causing false negative results in acute infection.    HIV 1/2 Antibodies NON REACTIVE NON REACTIVE   Interpretation (HIV Ag Ab)      A non reactive test result means that HIV 1 or HIV 2 antibodies and HIV 1 p24 antigen were not detected in the specimen.    Comment: Performed at [facility], [place] [address] [place], [place] [address]  Vitamin B12     Status: None   Collection Time: 04/05/21  6:32 PM  Result Value Ref Range   Vitamin B-12 274 180 - 914 pg/mL    Comment: (NOTE) This assay is not validated for testing neonatal or myeloproliferative syndrome specimens for Vitamin B12 levels. Performed at [facility], [place] [address], [place], [place] [address]   TSH     Status: None  Collection Time: 04/05/21  6:32 PM  Result Value Ref Range   TSH 1.331 0.350 - 4.500 uIU/mL    Comment: Performed by a 3rd Generation assay with a functional sensitivity of <=0.01 uIU/mL. Performed at [facility], [place] [address] [place], [place] [address]   T4, free     Status: None   Collection Time: 04/05/21  6:32 PM  Result Value Ref Range   Free T4 0.96 0.61 - 1.12 ng/dL    Comment: (NOTE) Biotin ingestion may interfere with free T4 tests. If the results are inconsistent with the TSH level, previous test results, or the clinical presentation, then consider biotin interference. If needed, order  repeat testing after stopping biotin. Performed at [facility], [place] [address] [place], [place] [address]   CBC with Differential/Platelet     Status: None   Collection Time: 04/05/21  6:32 PM  Result Value Ref Range   WBC 6.4 4.0 - 10.5 K/uL   RBC 3.99 3.87 - 5.11 MIL/uL   Hemoglobin 12.5 12.0 - 15.0 g/dL   HCT 38.8 36.0 - 46.0 %   MCV 97.2 80.0 - 100.0 fL   MCH 31.3 26.0 - 34.0 pg   MCHC 32.2 30.0 - 36.0 g/dL   RDW 13.1 11.5 - 15.5 %   Platelets 238 150 - 400 K/uL   nRBC 0.0 0.0 - 0.2 %   Neutrophils Relative % 53 %   Neutro Abs 3.4 1.7 - 7.7 K/uL   Lymphocytes Relative 37 %   Lymphs Abs 2.4 0.7 - 4.0 K/uL   Monocytes Relative 9 %   Monocytes Absolute 0.6 0.1 - 1.0 K/uL   Eosinophils Relative 1 %   Eosinophils Absolute 0.1 0.0 - 0.5 K/uL   Basophils Relative 0 %   Basophils Absolute 0.0 0.0 - 0.1 K/uL   Immature Granulocytes 0 %   Abs Immature Granulocytes 0.02 0.00 - 0.07 K/uL    Comment: Performed at [facility], [place] [address], [place], [place] [address]  Ammonia     Status: Abnormal   Collection Time: 04/05/21  6:32 PM  Result Value Ref Range   Ammonia 43 (H) 9 - 35 umol/L    Comment: Performed at [facility], [name] [address], [place], [place] [address]  Sedimentation rate     Status: None   Collection Time: 04/05/21  6:32 PM  Result Value Ref Range   Sed Rate 8 0 - 22 mm/hr    Comment: Performed at [facility], [place] [address], [place], [place] [address]  ANA w/Reflex if Positive     Status: None   Collection Time: 04/05/21  6:32 PM  Result Value Ref Range   Anti Nuclear Antibody (ANA) Negative Negative    Comment: (NOTE) Performed At: [facility] [place], [place] [id number] Rush Farmer MD [id number]     Blood Alcohol level:  Lab Results  Component Value Date   [facility] <5 09/32/6712    Metabolic Disorder Labs: Lab Results  Component Value Date   HGBA1C 4.5 (L)  04/04/2021   MPG 82.45 04/04/2021   MPG 82.45 04/02/2021   Lab Results  Component Value Date   PROLACTIN 3.5 (L) 04/02/2021   Lab Results  Component Value Date   CHOL 180 04/04/2021   TRIG 62 04/04/2021   HDL 64 04/04/2021   CHOLHDL 2.8 04/04/2021   VLDL 12 04/04/2021   LDLCALC 104 (H) 04/04/2021  Physical Findings: AIMS: Facial and Oral Movements Muscles of Facial Expression: None, normal Lips and Perioral Area: None, normal Jaw: None, normal Tongue: None, normal,Extremity Movements Upper (arms, wrists, hands, fingers): None, normal Lower (legs, knees, ankles, toes): None, normal, Trunk Movements Neck, shoulders, hips: None, normal, Overall Severity Severity of abnormal movements (highest score from questions above): None, normal Incapacitation due to abnormal movements: None, normal Patient's awareness of abnormal movements (rate only patient's report): No Awareness, Dental Status Current problems with teeth and/or dentures?: Yes Does patient usually wear dentures?: Yes  CIWA:  CIWA-Ar Total: 0 COWS:  COWS Total Score: 1  Musculoskeletal: Strength & Muscle Tone: within normal limits Gait & Station: normal Patient leans: N/A  Psychiatric Specialty Exam:  Presentation  General Appearance: Fairly Groomed  Eye Contact:Good  Speech:Normal Rate; Other (comment) (Better articulation and more easily understood with dentures in place)  Speech Volume:Normal  Handedness:Right   Mood and Affect  Mood:Anxious  Affect:Constricted   Thought Process  Thought Processes:Coherent; Other (comment) (Much more organized today)  Descriptions of Associations:Tangential  Orientation:Full (Time, Place and Person)  Thought Content:Logical  History of Schizophrenia/Schizoaffective disorder:No data recorded Duration of Psychotic Symptoms:No data recorded Hallucinations:Hallucinations: None  Ideas of Reference:None  Suicidal Thoughts:Suicidal Thoughts: No  Homicidal  Thoughts:Homicidal Thoughts: No   Sensorium  Memory:Immediate Poor; Recent Fair; Remote Fair (Object registration 3 of 3 objects; but object recall only 1 of 3 objects)  Judgment:Impaired  Insight:Shallow   Executive Functions  Concentration:Fair  Attention Span:Fair  [place] of Knowledge:Poor  Language:Fair   Psychomotor Activity  Psychomotor Activity:Psychomotor Activity: Normal   Assets  Assets:Communication Skills; Desire for Improvement; Housing; Social Support   Sleep  Sleep:Sleep: Fair Number of Hours of Sleep: 3.5    Physical Exam: Physical Exam Vitals and nursing note reviewed.  Constitutional:      General: She is not in acute distress.    Appearance: She is not diaphoretic.  HENT:     Head: Normocephalic.  Cardiovascular:     Rate and Rhythm: Normal rate.  Pulmonary:     Effort: Pulmonary effort is normal.  Neurological:     General: No focal deficit present.     Mental Status: She is alert.   Review of Systems  Constitutional:  Negative for chills, diaphoresis and fever.  HENT:  Negative for sore throat.   Eyes: Negative.   Respiratory:  Negative for cough and shortness of breath.   Cardiovascular:  Negative for chest pain and palpitations.  Gastrointestinal:  Positive for diarrhea. Negative for constipation, nausea and vomiting.  Genitourinary: Negative.   Musculoskeletal:  Positive for back pain.       Positive for chronic back pain  Skin:  Negative for rash.  Neurological:  Negative for dizziness, tremors, focal weakness, seizures, weakness and headaches.       Positive for neuropathy in feet  Psychiatric/Behavioral:  Positive for depression, memory loss and substance abuse. Negative for hallucinations and suicidal ideas. The patient has insomnia.   Blood pressure 107/88, pulse 82, temperature 98.5 F (36.9 C), temperature source Oral, resp. rate 16, height 5' 3"  (1.6 m), weight 46.7 kg, SpO2 99 %. Body mass index is 18.25  kg/m.   Treatment Plan Summary:  Patient's cognition is improving. Daily contact with patient to assess and evaluate symptoms and progress in treatment and Medication management  Continue every 15-minute safety checks  Encourage participation in group therapy and therapeutic milieu  Altered mental status/psychosis -Continue Haloperidol 1 mg twice daily.    -  Head CT performed [id number] showed "Unremarkable noncontrast CT appearance of the brain.  No evidence of acute intracranial abnormality.  Left sphenoid sinusitis." -Lab results from evening draw on 04/05/2021 showed mildly elevated ammonia.  Results from [place] testing and GC/chlamydia testing are pending  Depression -Continue off sertraline as patient declines to take.  Substance use disorder -Continue COWS protocol with comfort meds to cover for withdrawal from opiates -Continue CIWA protocol with PRN lorazepam and comfort meds to cover for possible withdrawal from alcohol or benzodiazepines  Insomnia -Continue trazodone 50 mg QHS PRN  Anxiety -Continue hydroxyzine 19m Q6H PRN  Discharge planning in progress.  Patient would benefit from participation in substance abuse treatment after discharge but is not motivated to participate in substance abuse treatment at this time.  Patient will need to follow-up with outpatient primary care provider as well as outpatient psychiatric provider after discharge.  MArthor Captain MD 04/06/2021, 4:44 PM

## 2021-04-06 NOTE — Progress Notes (Signed)
Recreation Therapy Notes  Date:  7.13.22 Time: 0930 Location: [address] Dayroom  Group Topic: Stress Management  Goal Area(s) Addresses:  Patient will identify positive stress management techniques. Patient will identify benefits of using stress management post d/c.  Intervention: Stress Management  Activity :  Meditation is used to calm the patient to center their attention on breathing techniques to relax.  Patients are sit quietly and follow along with the meditation as it plays to become more and more relaxed with each breath to feel at peace.  Education:  Stress Management, Discharge Planning.   Education Outcome: Acknowledges Education  Clinical Observations/Feedback: Despite invitation to group, pt did not attend group session.     Caroll Rancher, LRT/CTRS      Caroll Rancher A 04/06/2021 12:32 PM

## 2021-04-07 DIAGNOSIS — F29 Unspecified psychosis not due to a substance or known physiological condition: Secondary | ICD-10-CM | POA: Diagnosis not present

## 2021-04-07 MED ORDER — CEPHALEXIN 250 MG PO CAPS
250.0000 mg | ORAL_CAPSULE | Freq: Three times a day (TID) | ORAL | Status: DC
  Administered 2021-04-07 – 2021-04-10 (×11): 250 mg via ORAL
  Filled 2021-04-07 (×3): qty 1
  Filled 2021-04-07: qty 10
  Filled 2021-04-07 (×7): qty 1
  Filled 2021-04-07: qty 10
  Filled 2021-04-07 (×8): qty 1
  Filled 2021-04-07: qty 10

## 2021-04-07 NOTE — Progress Notes (Signed)
[facility] MD Progress Note  04/07/2021 2:30 PM Emily [name]  MRN:  [id number]  Subjective: Emily [name] reports, "I came to the hospital because I was having suicidal thoughts. I have always had those thoughts off & on, but it got worse on that day. I woke-up with it on that day. It was real bad. When I arrived at the hospital, I was feeling like people were after me, I was also sad & depressed. But today, my mood & mind set are up, like they have improved. The doctor started me on haldol, I'm doing well on this medicines". Daily notes: Emily [name] is seen, chart reviewed. The chart findings discussed with the treatment team. She presents alert, oriented & aware of situation. She is visible on the unit, attending group sessions. Patient presents physically rough-looking, appearing twice her stated age. She is fairly-groomed, verbally responsive & making a good eye contact. She was admitted with complaint of suicidal ideations without plans or intent, worsening depression/anxiety. Chart review reports indicated patient presented following her admission assessment a bit confused, disorganized, tangential & lacking insight to her mental health/substance abuse issues. But today during this assessment, patient provided a lot more information that seem meaningful about her mental health/substance abuse hx. She says she is a methamphetamine addict, did not provide time frame. She states that she was diagnosed as having Schizophrenia, Bipolar disorder & major depression at the [facility] in [place] sometime last year in 2021. Says has not been on any medications for her mental health conditions. She says she lives in [place], [place] but may try to move down to [place] after discharge as she thinks mental health care is more accessible her in [place] than in the [place], [place] area. She reports today that her mood & mind-set are now improving instead of going down. She participated actively in this assessment.  She did state the year/month correctly, named the current U.[place] president, correctly named 3 objects (apple, pencil, ball)in order & also backwards. She was able to spell water correctly & also backwards. She did participate in group session this morning. States that the group session topic was about good nutrition & setting goals. She says her goal for the day/long term is to stay sober after discharge, however, Emily [name] says she is not ready to go to a substance abuse treatment program after discharge. She plans to go home after discharge. Previous reports from staff per documentation indicated that patient may have had a blister to her foot area of which she used a pencil to punch a hole to the blister to drain out the pus. Initial assessment of patient's feet by this provider showed some bunions (intact) to her feet area, patient was noted to be focused on the bunion to her right big toe saying that her shoes were constantly rubbing on this bunion. A second assessment of patients feet by this provider & patient's RN did show a lot of calluses to both soles of her feet, no drainage noted. Patient states she walked around a lot with no shoes or at times wearing only flip-flops because she does not like wearing tennis or regular shoes. She says the calluses has been there for a while. To prevent infection to this calluses, patient is started on Keflex 250 mg po tid x 7 days. Patient reports doing good today. Slept for about 6.75 hrs last night. Taking & tolerating her treatment regimen. Denies any side effects or reactions. There are no changes made on her current  plan of care for her mental health issues. Will continue as already in progress. Emily [name] denies any SIHI, AVH, delusional thoughts or paranoia. She does not appear to be responding to any internal stimuli. Quant testing and GC/chlamydia testing are still pending.  Reason for admission:  Emily [name] is a 35 year old female with a history of substance abuse,  schizophrenia and bipolar disorder admitted voluntarily from [place] ED for suicidal ideation without a plan and worsening depression and anxiety.  She also endorsed auditory hallucinations on presentation to the ED.  (Per previous notes)Objective: Medical record reviewed.  Patient's case discussed in detail with members of the treatment team.  I met with and evaluated the patient on the unit today for follow-up.  Patient appears improved today and is alert, more organized and more engaged in our conversation.  Eye contact is better and speech is more easily understood since patient is wearing dentures today.  Patient states that she did not sleep well last night but did not take trazodone because she wanted to try to sleep on her own.  She reports her mood is intermittently down but she declines to resume treatment with Zoloft for her depression.  She denies passive wish for death, SI, AI, HI, AH, VH, PI or first rank psychotic symptoms.  She denies any significant worry.  She is fully oriented to month, year, date, [facility] and hospital.  Patient is able to tell me that she was living with her sister for about a month prior to admission in [place].  Patient denies any alcohol use or drug use prior to admission.  She states that she plans to stay with a friend after discharge and is hopeful for discharge soon.  She experiences occasional anxiety.  She denies any medication side effects or physical problems.  Patient denies awareness of her recent confusion or the difficulty she has with her memory or concentration during this hospitalization.  She is able to register 3 of 3 objects but can recall only 1 of 3 objects at 3 minutes.  Attention and concentration are better; she provides more responsive answers to questions and she is able to state the months of the year in reverse slowly with 1 error.  Patient is able to follow three-step commands without error today.  She has no issues with naming.  I  discussed with patient that her head CT performed yesterday was normal.  I also reviewed her available lab results with her from evening draw last night.  I discussed with the patient that review of past lab work indicates she tested positive for hepatitis C in the past, but patient denies any history of liver problems or past positive test for hepatitis C.  Patient seemed unconcerned with the results of her lab tests and imaging study.  Hours of sleep last night were not recorded by staff.  Patient had some orthostasis this morning with heart rate of 106 seated and 122 standing.  Repeat heart rate at 945 was 82.  Vital signs were otherwise WNL and the patient is afebrile.  Lab results from evening draw on 04/05/2021 include CMP which was WNL.  Ammonia level which was mildly elevated at 43.  Normal vitamin B12 level of 274.  CBC and differential which were WNL.  Sed rate was WNL at 8.  TFTs were WNL with TSH of 1.331 and free T4 of 0.96.  Antinuclear antibody was negative.  RPR was nonreactive.  HIV testing was negative.  Urine has been collected for  GC and chlamydia but results are not yet available.  HCV RNA quant has been drawn but results are not yet available.  Head CT performed yesterday showed "unremarkable noncontrast CT appearance of the brain.  No evidence of acute intracranial abnormality.  Left sphenoid sinusitis."  Principal Problem: Schizophrenia spectrum disorder with psychotic disorder type not yet determined ([place]) Diagnosis: Principal Problem:   Schizophrenia spectrum disorder with psychotic disorder type not yet determined ([place]) Active Problems:   Substance abuse ([place])  Total Time spent with patient: 15 minutes  Past Psychiatric History: See admission H&P  Past Medical History:  Past Medical History:  Diagnosis Date   Anxiety    Anxiety    Depression    History reviewed. No pertinent surgical history.  Family History: History reviewed. No pertinent family history.  Family  Psychiatric  History: See admission H&P  Social History:  Social History   Substance and Sexual Activity  Alcohol Use Yes   Comment: 2-3 drinks but none recently per pt report     Social History   Substance and Sexual Activity  Drug Use No    Social History   Socioeconomic History   Marital status: Single    Spouse name: Not on file   Number of children: Not on file   Years of education: Not on file   Highest education level: Not on file  Occupational History   Not on file  Tobacco Use   Smoking status: Every Day    Passive exposure: Past   Smokeless tobacco: Never  Substance and Sexual Activity   Alcohol use: Yes    Comment: 2-3 drinks but none recently per pt report   Drug use: No   Sexual activity: Not Currently  Other Topics Concern   Not on file  Social History Narrative   Not on file   Social Determinants of Health   Financial Resource Strain: Not on file  Food Insecurity: Not on file  Transportation Needs: Not on file  Physical Activity: Not on file  Stress: Not on file  Social Connections: Not on file   Additional Social History:   Sleep: 6.75 (improving).  Appetite:  Good  Current Medications: Current Facility-Administered Medications  Medication Dose Route Frequency Provider Last Rate Last Admin   acetaminophen (TYLENOL) tablet 650 mg  650 mg Oral Q6H PRN Nelda Marseille, Amy E, MD   650 mg at 04/06/21 1720   alum & mag hydroxide-simeth (MAALOX/MYLANTA) 200-200-20 MG/5ML suspension 15 mL  15 mL Oral Q6H PRN Nelda Marseille, Amy E, MD       cephALEXin (KEFLEX) capsule 250 mg  250 mg Oral Q8H ,  I, NP       feeding supplement (ENSURE ENLIVE / ENSURE PLUS) liquid 237 mL  237 mL Oral BID BM Derrill Center, NP   237 mL at 04/06/21 0938   haloperidol (HALDOL) tablet 1 mg  1 mg Oral BID Arthor Captain, MD   1 mg at 04/07/21 2836   hydrOXYzine (ATARAX/VISTARIL) tablet 25 mg  25 mg Oral Q6H PRN Arthor Captain, MD   25 mg at 04/06/21 2108   magnesium  hydroxide (MILK OF MAGNESIA) suspension 5 mL  5 mL Oral Daily PRN Harlow Asa, MD       multivitamin with minerals tablet 1 tablet  1 tablet Oral Daily Harlow Asa, MD   1 tablet at 04/07/21 0929   nicotine (NICODERM CQ - dosed in mg/24 hours) patch 14 mg  14 mg Transdermal  Daily Sharma Covert, MD   14 mg at 04/07/21 0929   pantoprazole (PROTONIX) EC tablet 40 mg  40 mg Oral Daily Harlow Asa, MD   40 mg at 04/07/21 4268   thiamine tablet 100 mg  100 mg Oral Daily Harlow Asa, MD   100 mg at 04/07/21 0929   traZODone (DESYREL) tablet 50 mg  50 mg Oral QHS PRN Derrill Center, NP   50 mg at 04/06/21 2108   Lab Results:  Results for orders placed or performed during the hospital encounter of 04/02/21 (from the past 48 hour(s))  Comprehensive metabolic panel     Status: None   Collection Time: 04/05/21  6:32 PM  Result Value Ref Range   Sodium 137 135 - 145 mmol/L   Potassium 3.9 3.5 - 5.1 mmol/L   Chloride 106 98 - 111 mmol/L   CO2 25 22 - 32 mmol/L   Glucose, Bld 89 70 - 99 mg/dL    Comment: Glucose reference range applies only to samples taken after fasting for at least 8 hours.   BUN 13 6 - 20 mg/dL   Creatinine, Ser 0.65 0.44 - 1.00 mg/dL   Calcium 8.9 8.9 - 10.3 mg/dL   Total Protein 6.7 6.5 - 8.1 g/dL   Albumin 3.7 3.5 - 5.0 g/dL   AST 27 15 - 41 U/L   ALT 27 0 - 44 U/L   Alkaline Phosphatase 38 38 - 126 U/L   Total Bilirubin 0.4 0.3 - 1.2 mg/dL   GFR, Estimated >60 >60 mL/min    Comment: (NOTE) Calculated using the CKD-EPI Creatinine Equation (2021)    Anion gap 6 5 - 15    Comment: Performed at [facility], [place] [address], [place], [place] [address]  RPR     Status: None   Collection Time: 04/05/21  6:32 PM  Result Value Ref Range   RPR Ser Ql NON REACTIVE NON REACTIVE    Comment: Performed at [facility], [place] [address], [place], [place] [address]  HIV Antibody (routine testing w rflx)     Status: None   Collection  Time: 04/05/21  6:32 PM  Result Value Ref Range   HIV Screen 4th Generation wRfx Non Reactive Non Reactive    Comment: Performed at [facility], [place] [address] [place], [place] [address]  Rapid HIV screen (HIV 1/2 Ab+Ag) ([facility] Only)     Status: None   Collection Time: 04/05/21  6:32 PM  Result Value Ref Range   HIV-1 P24 Antigen - HIV24 NON REACTIVE NON REACTIVE    Comment: RESULT CALLED TO, READ BACK BY AND VERIFIED WITH: Dina Rich. RN _0  04/05/21 KELLY, J. (NOTE) Detection of p24 may be inhibited by biotin in the sample, causing false negative results in acute infection.    HIV 1/2 Antibodies NON REACTIVE NON REACTIVE   Interpretation (HIV Ag Ab)      A non reactive test result means that HIV 1 or HIV 2 antibodies and HIV 1 p24 antigen were not detected in the specimen.    Comment: Performed at [facility], [place] [address], [place], [place] [address]  Vitamin B12     Status: None   Collection Time: 04/05/21  6:32 PM  Result Value Ref Range   Vitamin B-12 274 180 - 914 pg/mL    Comment: (NOTE) This assay is not validated for testing neonatal or myeloproliferative syndrome specimens for Vitamin  B12 levels. Performed at [facility], [place] [address], [place], [place] [address]   TSH     Status: None   Collection Time: 04/05/21  6:32 PM  Result Value Ref Range   TSH 1.331 0.350 - 4.500 uIU/mL    Comment: Performed by a 3rd Generation assay with a functional sensitivity of <=0.01 uIU/mL. Performed at [facility], [place] [address], [place], [place] [address]   T4, free     Status: None   Collection Time: 04/05/21  6:32 PM  Result Value Ref Range   Free T4 0.96 0.61 - 1.12 ng/dL    Comment: (NOTE) Biotin ingestion may interfere with free T4 tests. If the results are inconsistent with the TSH level, previous test results, or the clinical presentation, then consider biotin interference. If needed, order repeat  testing after stopping biotin. Performed at [facility], [place] [address], [place], [place] [address]   CBC with Differential/Platelet     Status: None   Collection Time: 04/05/21  6:32 PM  Result Value Ref Range   WBC 6.4 4.0 - 10.5 K/uL   RBC 3.99 3.87 - 5.11 MIL/uL   Hemoglobin 12.5 12.0 - 15.0 g/dL   HCT 38.8 36.0 - 46.0 %   MCV 97.2 80.0 - 100.0 fL   MCH 31.3 26.0 - 34.0 pg   MCHC 32.2 30.0 - 36.0 g/dL   RDW 13.1 11.5 - 15.5 %   Platelets 238 150 - 400 K/uL   nRBC 0.0 0.0 - 0.2 %   Neutrophils Relative % 53 %   Neutro Abs 3.4 1.7 - 7.7 K/uL   Lymphocytes Relative 37 %   Lymphs Abs 2.4 0.7 - 4.0 K/uL   Monocytes Relative 9 %   Monocytes Absolute 0.6 0.1 - 1.0 K/uL   Eosinophils Relative 1 %   Eosinophils Absolute 0.1 0.0 - 0.5 K/uL   Basophils Relative 0 %   Basophils Absolute 0.0 0.0 - 0.1 K/uL   Immature Granulocytes 0 %   Abs Immature Granulocytes 0.02 0.00 - 0.07 K/uL    Comment: Performed at [facility], [place] [address], [place], [name] [address]  Ammonia     Status: Abnormal   Collection Time: 04/05/21  6:32 PM  Result Value Ref Range   Ammonia 43 (H) 9 - 35 umol/L    Comment: Performed at [facility], [place] [address], [place], [place] [address]  Sedimentation rate     Status: None   Collection Time: 04/05/21  6:32 PM  Result Value Ref Range   Sed Rate 8 0 - 22 mm/hr    Comment: Performed at [facility], [place] [address], [place], [place] [address]  ANA w/Reflex if Positive     Status: None   Collection Time: 04/05/21  6:32 PM  Result Value Ref Range   Anti Nuclear Antibody (ANA) Negative Negative    Comment: (NOTE) Performed At: [facility] [place], [place] [id number] Rush Farmer MD [id number]    Blood Alcohol level:  Lab Results  Component Value Date   [facility] <5 26/94/8546   Metabolic Disorder Labs: Lab Results  Component Value Date   HGBA1C 4.5 (L) 04/04/2021    MPG 82.45 04/04/2021   MPG 82.45 04/02/2021   Lab Results  Component Value Date   PROLACTIN 3.5 (L) 04/02/2021   Lab Results  Component Value Date   CHOL 180 04/04/2021   TRIG 62 04/04/2021  HDL 64 04/04/2021   CHOLHDL 2.8 04/04/2021   VLDL 12 04/04/2021   LDLCALC 104 (H) 04/04/2021   Physical Findings: AIMS: Facial and Oral Movements Muscles of Facial Expression: None, normal Lips and Perioral Area: None, normal Jaw: None, normal Tongue: None, normal,Extremity Movements Upper (arms, wrists, hands, fingers): None, normal Lower (legs, knees, ankles, toes): None, normal, Trunk Movements Neck, shoulders, hips: None, normal, Overall Severity Severity of abnormal movements (highest score from questions above): None, normal Incapacitation due to abnormal movements: None, normal Patient's awareness of abnormal movements (rate only patient's report): No Awareness, Dental Status Current problems with teeth and/or dentures?: Yes Does patient usually wear dentures?: Yes  CIWA:  CIWA-Ar Total: 1 COWS:  COWS Total Score: 1  Musculoskeletal: Strength & Muscle Tone: within normal limits Gait & Station: normal Patient leans: N/A  Psychiatric Specialty Exam:  Presentation  General Appearance: Fairly Groomed; Appropriate for Environment  Eye Contact:Good  Speech:Normal Rate; Other (comment); Clear and Coherent (Better articulation and more easily understood with dentures in place)  Speech Volume:Normal  Handedness:Right  Mood and Affect  Mood:Euthymic  Affect:Appropriate; Other (comment) (Reactive.)  Thought Process  Thought Processes:Coherent; Other (comment) (Much more organized today)  Descriptions of Associations:Intact  Orientation:Full (Time, Place and Person)  Thought Content:Logical  History of Schizophrenia/Schizoaffective disorder:No data recorded Duration of Psychotic Symptoms:No data recorded Hallucinations:Hallucinations: None  Ideas of  Reference:None  Suicidal Thoughts:Suicidal Thoughts: No SI Passive Intent and/or Plan: Without Intent; Without Plan; Without Means to Carry Out; Without Access to Means  Homicidal Thoughts:Homicidal Thoughts: No  Sensorium  Memory:Remote Fair; Immediate Good; Recent Good (Object registration 3 of 3 objects; but object recall only 3 of 3 objects, much improved from previous assessments.)  Judgment:Fair  Insight:Fair  Executive Functions  Concentration:Fair  Attention Span:Fair  [place] of Knowledge:Poor; Fair  Language:Fair  Psychomotor Activity  Psychomotor Activity:Psychomotor Activity: Normal  Assets  Assets:Communication Skills; Desire for Improvement; Housing; Social Support  Sleep  Sleep:Sleep: Good Number of Hours of Sleep: 6.75  Physical Exam: Physical Exam Vitals and nursing note reviewed.  Constitutional:      General: She is not in acute distress.    Appearance: She is not diaphoretic.  HENT:     Head: Normocephalic.     Nose: Nose normal.     Mouth/Throat:     Pharynx: Oropharynx is clear.  Eyes:     Pupils: Pupils are equal, round, and reactive to light.  Cardiovascular:     Rate and Rhythm: Normal rate.     Pulses: Normal pulses.  Pulmonary:     Effort: Pulmonary effort is normal.  Genitourinary:    Comments: Deferred Musculoskeletal:        General: Normal range of motion.     Cervical back: Normal range of motion.  Skin:    General: Skin is warm.  Neurological:     General: No focal deficit present.     Mental Status: She is alert and oriented to person, place, and time.   Review of Systems  Constitutional:  Negative for chills, diaphoresis and fever.  HENT:  Negative for sore throat.   Eyes: Negative.   Respiratory:  Negative for cough and shortness of breath.   Cardiovascular:  Negative for chest pain and palpitations.  Gastrointestinal:  Negative for constipation, diarrhea, nausea and vomiting.  Genitourinary:  Negative.   Musculoskeletal:  Negative for back pain.       Positive for chronic back pain  Skin:  Negative for rash.  Neurological:  Negative for dizziness, tremors, focal weakness, seizures, weakness and headaches.       Positive for neuropathy in feet  Endo/Heme/Allergies:        Allergies: Bee venom  Psychiatric/Behavioral:  Positive for depression, memory loss and substance abuse. Negative for hallucinations and suicidal ideas. The patient has insomnia. The patient is not nervous/anxious.   Blood pressure 106/76, pulse 83, temperature 98.2 F (36.8 C), temperature source Oral, resp. rate 16, height 5' 3" (1.6 m), weight 46.7 kg, SpO2 100 %. Body mass index is 18.25 kg/m.  Treatment Plan Summary:  Patient's cognition is improving. Daily contact with patient to assess and evaluate symptoms and progress in treatment and Medication management.  Continue inpatient hospitalization. Will continue today 04/07/2021 plan as below except where it is noted.   Continue every 15-minute safety checks Encourage participation in group therapy and therapeutic milieu  Altered mental status/psychosis -Continue Haloperidol 1 mg twice daily for mood control.   -Head CT performed [id number] showed "Unremarkable noncontrast CT appearance of the brain.  No evidence of acute intracranial abnormality.  Left sphenoid sinusitis." -Lab results from evening draw on 04/05/2021 showed mildly elevated ammonia.  Results from [place] testing and GC/chlamydia testing are pending  Depression -Continue off sertraline as patient declines to take.  Substance use disorder -Continue COWS protocol with comfort meds to cover for withdrawal from opiates -Continue CIWA protocol with PRN lorazepam and comfort meds to cover for possible withdrawal from alcohol or benzodiazepines  Insomnia -Continue trazodone 50 mg QHS PRN  Anxiety -Continue hydroxyzine 6m Q6H PRN.   -Old callus to right sole. Initiated Keflex 250  mg po tid x 7 days for infection prevention.  Discharge planning in progress.  Patient would benefit from participation in substance abuse treatment after discharge but is not motivated to participate in substance abuse treatment at this time.  Patient will need to follow-up with outpatient primary care provider as well as outpatient psychiatric provider after discharge.  ALindell Spar NP, pmhnp, fnp-bc 04/07/2021, 2:30 PM

## 2021-04-07 NOTE — Progress Notes (Signed)
Omie was up and visible on the unit this evening.  She did attend evening wrap up group.  Minimal interaction with peers.  She states that she had a good day and is hoping to be discharged soon.  She took hs medications without difficulty.  She denied any SI/HI or AVH.  Q 15 minute checks maintained for safety.     04/06/21 2108  Psych Admission Type (Psych Patients Only)  Admission Status Voluntary  Psychosocial Assessment  Patient Complaints Anxiety;Insomnia  Eye Contact Fair  Facial Expression Sad  Affect Appropriate to circumstance  Speech Unremarkable  Interaction Assertive  Motor Activity Other (Comment) (Unremarkable)  Appearance/Hygiene Disheveled  Behavior Characteristics Anxious  Mood Anxious;Depressed  Thought Process  Coherency WDL  Content WDL  Delusions None reported or observed  Perception WDL  Hallucination None reported or observed  Judgment Impaired  Confusion None  Danger to Self  Current suicidal ideation? Denies  Danger to Others  Danger to Others None reported or observed

## 2021-04-07 NOTE — Progress Notes (Signed)
Adult Psychoeducational Group Note  Date:  04/07/2021 Time:  11:24 AM  Group Topic/Focus:  Goals Group:   The focus of this group is to help patients establish daily goals to achieve during treatment and discuss how the patient can incorporate goal setting into their daily lives to aide in recovery.  Participation Level:  Active  Participation Quality:  Appropriate  Affect:  Appropriate  Cognitive:  Appropriate  Insight: Appropriate  Engagement in Group:  Engaged  Modes of Intervention:  Discussion  Additional Comments:  Pt attended group and participated in discussion.  Pt states that her medication is helping and her goal is to keep a straight mind.  Evaluna Utke R Para Cossey 04/07/2021, 11:24 AM

## 2021-04-07 NOTE — Progress Notes (Signed)
  Pt alert and talkative this shift.  Pt denies SI/HI/AVH.  This RN and NP assessed pt's blister/callus on R foot. NP ordered antibiotics for blister/callus.  Pt has good appetite and takes medications without incident. Pt remains safe on the unit with q 15 min checks in place.    04/07/21 0945  Psych Admission Type (Psych Patients Only)  Admission Status Involuntary  Psychosocial Assessment  Patient Complaints Anxiety  Eye Contact Fair  Facial Expression Blank  Affect Anxious  Speech Unremarkable  Interaction Assertive  Motor Activity Other (Comment) (Unremarkable)  Appearance/Hygiene Disheveled  Behavior Characteristics Cooperative  Mood Anxious;Depressed  Thought Process  Coherency WDL  Content WDL  Delusions None reported or observed  Perception WDL  Hallucination None reported or observed  Judgment Impaired  Confusion None  Danger to Self  Current suicidal ideation? Denies  Danger to Others  Danger to Others None reported or observed

## 2021-04-07 NOTE — Progress Notes (Signed)
Emily [name] was in the day room much of the evening.  Minimal interaction with staff or peers.  She was very pleasant and reported feeling much better today.  She denied any SI/HI or AVH.  She had minimal thought blocking.  She did request Vistaril and trazodone for sleep and anxiety.  She is currently resting with her eyes closed and appears to be asleep.  Q 15 minute checks maintained for safety.   04/07/21 2045  Psych Admission Type (Psych Patients Only)  Admission Status Involuntary  Psychosocial Assessment  Patient Complaints Anxiety;Insomnia  Eye Contact Fair  Facial Expression Blank  Affect Anxious  Speech Unremarkable  Interaction Assertive  Motor Activity Other (Comment) (Unremarkable)  Appearance/Hygiene Unremarkable  Behavior Characteristics Cooperative;Anxious  Mood Anxious  Thought Process  Coherency WDL  Content WDL  Delusions None reported or observed  Perception WDL  Hallucination None reported or observed  Judgment Impaired  Confusion None  Danger to Self  Current suicidal ideation? Denies  Danger to Others  Danger to Others None reported or observed

## 2021-04-07 NOTE — [facility] Group Notes (Signed)
[facility] Group Notes:  (Nursing/MHT/Case Management/Adjunct)  Date:  04/07/2021  Time:  8:33 PM  Type of Therapy:  Group Therapy  Participation Level:  Active  Participation Quality:  Appropriate  Affect:  Appropriate  Cognitive:  Appropriate  Insight:  Improving  Engagement in Group:  Developing/Improving  Modes of Intervention:  Discussion  Summary of Progress/Problems:  Emily [name] 04/07/2021, 8:33 PM

## 2021-04-08 DIAGNOSIS — F29 Unspecified psychosis not due to a substance or known physiological condition: Secondary | ICD-10-CM | POA: Diagnosis not present

## 2021-04-08 DIAGNOSIS — R4182 Altered mental status, unspecified: Secondary | ICD-10-CM | POA: Diagnosis present

## 2021-04-08 LAB — HCV RT-PCR, QUANT (NON-GRAPH)

## 2021-04-08 LAB — HCV RNA QUANT

## 2021-04-08 LAB — HCV AB W REFLEX TO QUANT PCR: HCV Ab: >11 s/co ratio — ABNORMAL HIGH (ref 0.0–0.9)

## 2021-04-08 NOTE — Progress Notes (Signed)
Adult Psychoeducational Group Note  Date:  04/08/2021 Time:  9:48 PM  Group Topic/Focus:  Wrap-Up Group:   The focus of this group is to help patients review their daily goal of treatment and discuss progress on daily workbooks.  Participation Level:  Active  Participation Quality:  Appropriate  Affect:  Appropriate  Cognitive:  Appropriate  Insight: Appropriate  Engagement in Group:  Supportive  Modes of Intervention:  Discussion  Additional Comments: Pt articulated that she did not have a goal for today. Pt stated she did talk with staff about her care. Pt conveyed she did not make a phone call today and reported relationship with her family and support system was improving. Pt reported she took all medications provided and attended all meal services with 100% intake. Pt said her appetite was good today. Pt evaluated her sleep last night as good. Pt verbalized she felt good about herself and rated her overall day a 7 out of 10 on this date. Pt articulated she had no physical pain, and Pt denies no auditory or visual hallucinations or thoughts of harming herself or others. Pt said she would alert staff if anything changed. End of Wrap-Up Group progress report.     Nicoletta Dress 04/08/2021, 9:48 PM

## 2021-04-08 NOTE — Progress Notes (Signed)
   04/08/21 2124  Psych Admission Type (Psych Patients Only)  Admission Status Voluntary  Psychosocial Assessment  Patient Complaints None  Eye Contact Fair  Facial Expression Flat  Affect Appropriate to circumstance;Flat  Speech Logical/coherent  Interaction Assertive;Minimal;Guarded  Motor Activity Slow  Appearance/Hygiene Unremarkable  Behavior Characteristics Cooperative;Appropriate to situation  Mood Pleasant  Thought Process  Coherency WDL  Content WDL  Delusions None reported or observed  Perception WDL  Hallucination None reported or observed  Judgment Poor  Confusion None  Danger to Self  Current suicidal ideation? Denies  Danger to Others  Danger to Others None reported or observed

## 2021-04-08 NOTE — Progress Notes (Signed)
Progress note  Pt found in bed; compliant with medication administration. Pt denies any physical complaints. Pt denies any withdrawal symptoms. Pt has been seen in the milieu. Pt doesn't seem to be responding when observed. Pt declined their nutritional supplementation. Pt is pleasant. Pt denies si/hi/ah/vh and verbally agrees to approach staff if these become apparent or before harming themselves/others while at [facility].  A: Pt provided support and encouragement. Pt given medication per protocol and standing orders. Q58m safety checks implemented and continued.  R: Pt safe on the unit. Will continue to monitor.

## 2021-04-08 NOTE — Progress Notes (Signed)
Recreation Therapy Notes  Date: 7.15.22 Time: 0930 Location: [address]  Group Topic: Stress Management   Goal Area(s) Addresses:  Patient will actively participate in stress management techniques presented during session.  Patient will successfully identify benefit of practicing stress management post d/c.   Behavioral Response: Appropriate  Intervention: Meditation with ambient sound and script  Activity:  Meditation  LRT explained the stress management technique of meditation to patients. Patients was asked to participate in the technique introduced during session. LRT also debriefed patients on the topic. Patients were given suggestions of where to access meditations post d/c and encouraged to explore Youtube and other apps available on smartphones, tablets, and computers.   Education:  Stress Management, Discharge Planning.   Education Outcome: Acknowledges education  Clinical Observations/Feedback: Patient actively engaged in technique introduced and expressed no concerns.    Caroll Rancher, LRT/CTRS      Caroll Rancher A 04/08/2021 10:53 AM

## 2021-04-08 NOTE — Progress Notes (Signed)
[facility] MD Progress Note  04/08/2021 3:56 PM Emily [name]  MRN:  [id number]  Reason for admission:  Emily [name] is a 35 year old female with a history of substance abuse, schizophrenia and bipolar disorder admitted voluntarily from [facility] for suicidal ideation without a plan and worsening depression and anxiety.  She also endorsed auditory hallucinations on presentation to the ED.  Objective: Medical record reviewed.  Patient's case discussed in detail with members of the treatment team.  I met with and evaluated the patient on the unit today for follow-up.  She continues to improve.  Patient is alert with improved eye contact and superficially coherent thought processes.  No psychotic content is elicited and she does not appear to attend or respond to internal stimuli. Cognition is improved overall and patient denies episodes of confusion.  She is oriented to self, situation, city, month and year.  Memory is improved.  Patient states her mood is "okay" but acknowledges feeling depressed at times.  I encouraged her to allow me to restart her sertraline tomorrow for depression but the patient declined.  She denies passive wish for death, SI, AI, PI, AH or VH.  Patient denies any side effects from her medications.  She denies any physical problems.  I discussed with patient her hepatitis C infection and the adverse effects infection could have on her liver.  I advised her that she should follow-up after discharge in outpatient clinic regarding hepatitis C infection.  Patient appeared apathetic about hep C positive diagnosis and outpatient treatment for it.  Right foot exam and reported area of blister/callus shows no signs of infection.  Patient slept 6.75 hours last night.  Lab results today include HCV ab Reactive; >11.0.  GC/Chlamydia testing results still pending.  Chart notes document that patient has been visible in the day room, pleasant and reported feeling much better.  She has attended and  participated in groups today.  She is taking scheduled medications as prescribed.  Principal Problem: Altered mental state Diagnosis: Principal Problem:   Altered mental state Active Problems:   Psychosis (HCC)   Amphetamine use disorder, severe (HCC)   Hepatitis C antibody positive in blood   Substance abuse ([place])  Total Time spent with patient: 20 minutes  Past Psychiatric History: See admission H&P  Past Medical History:  Past Medical History:  Diagnosis Date   Anxiety    Anxiety    Depression    History reviewed. No pertinent surgical history. Family History: History reviewed. No pertinent family history. Family Psychiatric  History: See admission H&P Social History:  Social History   Substance and Sexual Activity  Alcohol Use Yes   Comment: 2-3 drinks but none recently per pt report     Social History   Substance and Sexual Activity  Drug Use No    Social History   Socioeconomic History   Marital status: Single    Spouse name: Not on file   Number of children: Not on file   Years of education: Not on file   Highest education level: Not on file  Occupational History   Not on file  Tobacco Use   Smoking status: Every Day    Passive exposure: Past   Smokeless tobacco: Never  Substance and Sexual Activity   Alcohol use: Yes    Comment: 2-3 drinks but none recently per pt report   Drug use: No   Sexual activity: Not Currently  Other Topics Concern   Not on file  Social History  Narrative   Not on file   Social Determinants of Health   Financial Resource Strain: Not on file  Food Insecurity: Not on file  Transportation Needs: Not on file  Physical Activity: Not on file  Stress: Not on file  Social Connections: Not on file   Additional Social History:                         Sleep: Good  Appetite:  Good  Current Medications: Current Facility-Administered Medications  Medication Dose Route Frequency Provider Last Rate Last Admin    acetaminophen (TYLENOL) tablet 650 mg  650 mg Oral Q6H PRN Nelda Marseille, Amy E, MD   650 mg at 04/07/21 1712   alum & mag hydroxide-simeth (MAALOX/MYLANTA) 200-200-20 MG/5ML suspension 15 mL  15 mL Oral Q6H PRN Nelda Marseille, Amy E, MD       cephALEXin (KEFLEX) capsule 250 mg  250 mg Oral Q8H Nwoko, Agnes I, NP   250 mg at 04/08/21 1500   feeding supplement (ENSURE ENLIVE / ENSURE PLUS) liquid 237 mL  237 mL Oral BID BM Derrill Center, NP   237 mL at 04/08/21 1500   haloperidol (HALDOL) tablet 1 mg  1 mg Oral BID Arthor Captain, MD   1 mg at 04/08/21 0744   hydrOXYzine (ATARAX/VISTARIL) tablet 25 mg  25 mg Oral Q6H PRN Arthor Captain, MD   25 mg at 04/07/21 2108   magnesium hydroxide (MILK OF MAGNESIA) suspension 5 mL  5 mL Oral Daily PRN Harlow Asa, MD       multivitamin with minerals tablet 1 tablet  1 tablet Oral Daily Harlow Asa, MD   1 tablet at 04/08/21 0744   nicotine (NICODERM CQ - dosed in mg/24 hours) patch 14 mg  14 mg Transdermal Daily Sharma Covert, MD   14 mg at 04/07/21 0929   pantoprazole (PROTONIX) EC tablet 40 mg  40 mg Oral Daily Viann Fish E, MD   40 mg at 04/08/21 0744   thiamine tablet 100 mg  100 mg Oral Daily Nelda Marseille, Amy E, MD   100 mg at 04/08/21 0744   traZODone (DESYREL) tablet 50 mg  50 mg Oral QHS PRN Derrill Center, NP   50 mg at 04/07/21 2108    Lab Results:  No results found for this or any previous visit (from the past 53 hour(s)).   Blood Alcohol level:  Lab Results  Component Value Date   ETH <5 68/08/7516    Metabolic Disorder Labs: Lab Results  Component Value Date   HGBA1C 4.5 (L) 04/04/2021   MPG 82.45 04/04/2021   MPG 82.45 04/02/2021   Lab Results  Component Value Date   PROLACTIN 3.5 (L) 04/02/2021   Lab Results  Component Value Date   CHOL 180 04/04/2021   TRIG 62 04/04/2021   HDL 64 04/04/2021   CHOLHDL 2.8 04/04/2021   VLDL 12 04/04/2021   LDLCALC 104 (H) 04/04/2021    Physical Findings: AIMS: Facial and  Oral Movements Muscles of Facial Expression: None, normal Lips and Perioral Area: None, normal Jaw: None, normal Tongue: None, normal,Extremity Movements Upper (arms, wrists, hands, fingers): None, normal Lower (legs, knees, ankles, toes): None, normal, Trunk Movements Neck, shoulders, hips: None, normal, Overall Severity Severity of abnormal movements (highest score from questions above): None, normal Incapacitation due to abnormal movements: None, normal Patient's awareness of abnormal movements (rate only patient's report): No Awareness, Dental Status Current  problems with teeth and/or dentures?: Yes Does patient usually wear dentures?: Yes  CIWA:  CIWA-Ar Total: 0 COWS:  COWS Total Score: 0  Musculoskeletal: Strength & Muscle Tone: within normal limits Gait & Station: normal Patient leans: N/A  Psychiatric Specialty Exam:  Presentation  General Appearance: Appropriate for Environment  Eye Contact:Good  Speech:Normal Rate; Other (comment) (limited amount; better articulation with dentures in place)  Speech Volume:Normal  Handedness:Right   Mood and Affect  Mood:Euthymic ("okay" per patient; Apathetic)  Affect:Constricted   Thought Process  Thought Processes:Coherent  Descriptions of Associations:Intact  Orientation:Full (Time, Place and Person)  Thought Content:Logical  History of Schizophrenia/Schizoaffective disorder:No data recorded Duration of Psychotic Symptoms:No data recorded Hallucinations:Hallucinations: None  Ideas of Reference:None  Suicidal Thoughts:Suicidal Thoughts: No  Homicidal Thoughts:Homicidal Thoughts: No   Sensorium  Memory:Immediate Fair; Recent Fair; Remote Fair  Judgment:Fair  Insight:Shallow   Executive Functions  Concentration:Fair  Attention Span:Fair  [place]   Psychomotor Activity  Psychomotor Activity:Psychomotor Activity: Normal   Assets   Assets:Communication Skills; Housing; Social Support   Sleep  Sleep:Sleep: Good Number of Hours of Sleep: 6.75    Physical Exam: Physical Exam Vitals and nursing note reviewed.  Constitutional:      General: She is not in acute distress.    Appearance: She is not diaphoretic.  HENT:     Head: Normocephalic.  Cardiovascular:     Rate and Rhythm: Normal rate.  Pulmonary:     Effort: Pulmonary effort is normal.  Neurological:     General: No focal deficit present.     Mental Status: She is alert.  Right foot exam and no signs infection at area of healing blister on third toe.  Review of Systems  Constitutional:  Negative for chills, diaphoresis and fever.  HENT:  Negative for sore throat.   Eyes: Negative.   Respiratory:  Negative for cough and shortness of breath.   Cardiovascular:  Negative for chest pain and palpitations.  Gastrointestinal:  Negative for constipation, diarrhea, nausea and vomiting.  Genitourinary: Negative.   Musculoskeletal:  Positive for back pain.       Positive for chronic back pain  Skin:  Negative for rash.  Neurological:  Negative for dizziness, tremors, seizures, weakness and headaches.       Positive for neuropathy in feet  Psychiatric/Behavioral:  Positive for substance abuse. Negative for hallucinations and suicidal ideas. The patient is not nervous/anxious and does not have insomnia.   Blood pressure 108/73, pulse 75, temperature 98.2 F (36.8 C), temperature source Oral, resp. rate 16, height 5' 3"  (1.6 m), weight 46.7 kg, SpO2 99 %. Body mass index is 18.25 kg/m.   Treatment Plan Summary:  Patient's cognition is improving. Daily contact with patient to assess and evaluate symptoms and progress in treatment and Medication management  Continue every 15-minute safety checks  Encourage participation in group therapy and therapeutic milieu  Altered mental status/psychosis -Continue Haloperidol 1 mg twice daily.    -Head CT performed  953 2023 showed "Unremarkable noncontrast CT appearance of the brain.  No evidence of acute intracranial abnormality.  Left sphenoid sinusitis." -Lab results from evening draw on 04/05/2021 showed mildly elevated ammonia.  Results from Brandt testing and GC/chlamydia testing are pending  Depression -Continue off sertraline as patient declines to take.  Substance use disorder -No signs or symptoms of withdrawal from opiates or alcohol.  -Discontinue COWS protocol with comfort meds to cover for withdrawal from opiates -Discontinue CIWA  protocol with PRN lorazepam and comfort meds to cover for possible withdrawal from alcohol or benzodiazepines  Insomnia -Continue trazodone 50 mg QHS PRN  Anxiety -Continue hydroxyzine 69m Q6H PRN  Right foot lesion -Examined today and no signs of infection -Continue Keflex 250 mg Q8H x 7 day course  Discharge planning in progress.  Patient would benefit from participation in substance abuse treatment after discharge but is not motivated to participate in substance abuse treatment at this time.  Patient will need to follow-up with outpatient primary care provider as well as outpatient psychiatric provider after discharge.  I have advised patient to follow-up in liver clinic/ID clinic for hepatitis C +.  MArthor Captain MD 04/08/2021, 3:56 PM

## 2021-04-08 NOTE — Plan of Care (Signed)
  Problem: Safety: Goal: Ability to identify and utilize support systems that promote safety will improve Outcome: Progressing   Problem: Self-Concept: Goal: Will verbalize positive feelings about self Outcome: Progressing Goal: Level of anxiety will decrease Outcome: Progressing

## 2021-04-08 NOTE — Tx Team (Signed)
Interdisciplinary Treatment and Diagnostic Plan Update  04/08/2021 Time of Session: 9:30am Emily [name] MRN: [id number]  Principal Diagnosis: Schizophrenia spectrum disorder with psychotic disorder type not yet determined [facility])  Secondary Diagnoses: Principal Problem:   Schizophrenia spectrum disorder with psychotic disorder type not yet determined (HCC) Active Problems:   Substance abuse (HCC)   Current Medications:  Current Facility-Administered Medications  Medication Dose Route Frequency Provider Last Rate Last Admin   acetaminophen (TYLENOL) tablet 650 mg  650 mg Oral Q6H PRN Mason Jim, Amy E, MD   650 mg at 04/07/21 1712   alum & mag hydroxide-simeth (MAALOX/MYLANTA) 200-200-20 MG/5ML suspension 15 mL  15 mL Oral Q6H PRN Mason Jim, Amy E, MD       cephALEXin (KEFLEX) capsule 250 mg  250 mg Oral Q8H Nwoko, Agnes I, NP   250 mg at 04/08/21 2297   feeding supplement (ENSURE ENLIVE / ENSURE PLUS) liquid 237 mL  237 mL Oral BID BM Oneta Rack, NP   237 mL at 04/06/21 0938   haloperidol (HALDOL) tablet 1 mg  1 mg Oral BID Claudie Revering, MD   1 mg at 04/08/21 0744   hydrOXYzine (ATARAX/VISTARIL) tablet 25 mg  25 mg Oral Q6H PRN Claudie Revering, MD   25 mg at 04/07/21 2108   magnesium hydroxide (MILK OF MAGNESIA) suspension 5 mL  5 mL Oral Daily PRN Comer Locket, MD       multivitamin with minerals tablet 1 tablet  1 tablet Oral Daily Comer Locket, MD   1 tablet at 04/08/21 0744   nicotine (NICODERM CQ - dosed in mg/24 hours) patch 14 mg  14 mg Transdermal Daily Antonieta Pert, MD   14 mg at 04/07/21 0929   pantoprazole (PROTONIX) EC tablet 40 mg  40 mg Oral Daily Bartholomew Crews E, MD   40 mg at 04/08/21 0744   thiamine tablet 100 mg  100 mg Oral Daily Mason Jim, Amy E, MD   100 mg at 04/08/21 0744   traZODone (DESYREL) tablet 50 mg  50 mg Oral QHS PRN Oneta Rack, NP   50 mg at 04/07/21 2108   PTA Medications: Medications Prior to Admission  Medication Sig  Dispense Refill Last Dose   OLANZapine (ZYPREXA) 7.5 MG tablet Take 1 tablet (7.5 mg total) by mouth at bedtime. (Patient not taking: No sig reported) 30 tablet 0 Not Taking   traZODone (DESYREL) 100 MG tablet Take 1 tablet (100 mg total) by mouth at bedtime as needed for sleep. (Patient not taking: No sig reported) 30 tablet 0 Not Taking    Patient Stressors: Health problems Medication change or noncompliance Substance abuse Other: Homeless  Patient Strengths: General fund of knowledge Other: Ability to seek out services when needed  Treatment Modalities: Medication Management, Group therapy, Case management,  1 to 1 session with clinician, Psychoeducation, Recreational therapy.   Physician Treatment Plan for Primary Diagnosis: Schizophrenia spectrum disorder with psychotic disorder type not yet determined (HCC) Long Term Goal(s): Improvement in symptoms so as ready for discharge   Short Term Goals: Ability to identify changes in lifestyle to reduce recurrence of condition will improve Ability to verbalize feelings will improve Ability to identify and develop effective coping behaviors will improve Compliance with prescribed medications will improve Ability to identify triggers associated with substance abuse/mental health issues will improve Ability to disclose and discuss suicidal ideas Ability to maintain clinical measurements within normal limits will improve  Medication Management: Evaluate patient's response,  side effects, and tolerance of medication regimen.  Therapeutic Interventions: 1 to 1 sessions, Unit Group sessions and Medication administration.  Evaluation of Outcomes: Progressing  Physician Treatment Plan for Secondary Diagnosis: Principal Problem:   Schizophrenia spectrum disorder with psychotic disorder type not yet determined (HCC) Active Problems:   Substance abuse (HCC)  Long Term Goal(s): Improvement in symptoms so as ready for discharge   Short Term  Goals: Ability to identify changes in lifestyle to reduce recurrence of condition will improve Ability to verbalize feelings will improve Ability to identify and develop effective coping behaviors will improve Compliance with prescribed medications will improve Ability to identify triggers associated with substance abuse/mental health issues will improve Ability to disclose and discuss suicidal ideas Ability to maintain clinical measurements within normal limits will improve     Medication Management: Evaluate patient's response, side effects, and tolerance of medication regimen.  Therapeutic Interventions: 1 to 1 sessions, Unit Group sessions and Medication administration.  Evaluation of Outcomes: Progressing   RN Treatment Plan for Primary Diagnosis: Schizophrenia spectrum disorder with psychotic disorder type not yet determined (HCC) Long Term Goal(s): Knowledge of disease and therapeutic regimen to maintain health will improve  Short Term Goals: Ability to remain free from injury will improve, Ability to verbalize frustration and anger appropriately will improve, Ability to verbalize feelings will improve, Ability to identify and develop effective coping behaviors will improve, and Compliance with prescribed medications will improve  Medication Management: RN will administer medications as ordered by provider, will assess and evaluate patient's response and provide education to patient for prescribed medication. RN will report any adverse and/or side effects to prescribing provider.  Therapeutic Interventions: 1 on 1 counseling sessions, Psychoeducation, Medication administration, Evaluate responses to treatment, Monitor vital signs and CBGs as ordered, Perform/monitor CIWA, COWS, AIMS and Fall Risk screenings as ordered, Perform wound care treatments as ordered.  Evaluation of Outcomes: Progressing   LCSW Treatment Plan for Primary Diagnosis: Schizophrenia spectrum disorder with  psychotic disorder type not yet determined (HCC) Long Term Goal(s): Safe transition to appropriate next level of care at discharge, Engage patient in therapeutic group addressing interpersonal concerns.  Short Term Goals: Engage patient in aftercare planning with referrals and resources, Increase social support, Increase ability to appropriately verbalize feelings, Facilitate patient progression through stages of change regarding substance use diagnoses and concerns, Identify triggers associated with mental health/substance abuse issues, and Increase skills for wellness and recovery  Therapeutic Interventions: Assess for all discharge needs, 1 to 1 time with Social worker, Explore available resources and support systems, Assess for adequacy in community support network, Educate family and significant other(s) on suicide prevention, Complete Psychosocial Assessment, Interpersonal group therapy.  Evaluation of Outcomes: Progressing   Progress in Treatment: Attending groups: Yes. Participating in groups: Yes. Taking medication as prescribed: Yes. Toleration medication: Yes. Family/Significant other contact made: Yes, individual(s) contacted:  cousin Patient understands diagnosis: Yes. Discussing patient identified problems/goals with staff: Yes. Medical problems stabilized or resolved: Yes. Denies suicidal/homicidal ideation: Yes. Issues/concerns per patient self-inventory: No.   New problem(s) identified: No, Describe:  none  New Short Term/Long Term Goal(s): detox, medication management for mood stabilization; elimination of SI thoughts; development of comprehensive mental wellness/sobriety plan  Patient Goals:  Did not attend  Discharge Plan or Barriers: Patient is to stay with cousin at discharge. Pt is to follow up with [facility] for follow up for therapy and medication management   Reason for Continuation of Hospitalization: Depression Medication stabilization Suicidal  ideation  Estimated  Length of Stay: 3-5 days  Attendees: Patient:  04/08/2021   Physician:  04/08/2021   Nursing:  04/08/2021   RN Care Manager: 04/08/2021  Social Worker: Ruthann Cancer, LCSW 04/08/2021   Recreational Therapist:  04/08/2021   Other:  04/08/2021  Other:  04/08/2021   Other: 04/08/2021     Scribe for Treatment Team: Otelia Santee, LCSW 04/08/2021 1:27 PM

## 2021-04-08 NOTE — [facility] Group Notes (Signed)
  [facility] LCSW Group Therapy     Type of Therapy and Topic:  Group Therapy: Conflict Resolution   Participation Level:  Active     Description of Group:   In this group session, patients started introductions, ground rules and an ice braker.  Patients shared "something that they would put up a serious fight for, if someone were trying to take it away?". Patients were asked why it is so important to them. CSW introduced group topic of conflict resolution. Patients were asked to share a disagreement, argument or fight they had with someone - analyzing how it ended and how it made them feel.  CSW provided psycho-education on conflict resolution. Patients had a discussion about different conflicts that they were experiencing and discussed different ways to resolve the conflict. Patients were asked to identify the conflict, reasons why its important to resolve the conflict, how the people in the scenarios feel and two suggestions to resolve the conflict (one positive). Patients were asked to close in a discussion analyzing why it's important to resolve conflict and how at times conflict can be a good thing or lead to positive outcomes.   Therapeutic Goals: Patients will remember a time they had a conflict and analyze their reactions. Patients will comprehend concepts related to conflict resolution. Patients will identify feelings and needs behind conflicts. Patients will engage in realistic role play scenarios. Patients will learn "I statements" to increase positive communication in situations where conflict arises. Patients will generate creative solutions for resolving conflict cooperatively.   Summary of Patient Progress:  Patient was engaged and participated in the group session.    Therapeutic Modalities:   Cognitive Behavioral Therapy Motivational Interviewing Brief Therapy     Ulrick Methot, LCSW, LCAS Clincal Social Worker [facility]  

## 2021-04-09 DIAGNOSIS — F29 Unspecified psychosis not due to a substance or known physiological condition: Secondary | ICD-10-CM | POA: Diagnosis not present

## 2021-04-09 MED ORDER — GABAPENTIN 100 MG PO CAPS
100.0000 mg | ORAL_CAPSULE | Freq: Three times a day (TID) | ORAL | Status: DC
  Administered 2021-04-09 – 2021-04-11 (×7): 100 mg via ORAL
  Filled 2021-04-09 (×15): qty 1

## 2021-04-09 NOTE — Progress Notes (Addendum)
   04/09/21 0936  Psych Admission Type (Psych Patients Only)  Admission Status Voluntary  Psychosocial Assessment  Patient Complaints Depression  Eye Contact Fair  Facial Expression Flat  Affect Appropriate to circumstance;Flat  Speech Logical/coherent  Interaction Assertive;Minimal;Guarded  Motor Activity Slow  Appearance/Hygiene Unremarkable  Behavior Characteristics Cooperative;Appropriate to situation;Anxious  Mood Anxious;Pleasant  Thought Process  Coherency WDL  Content WDL  Delusions None reported or observed  Perception WDL  Hallucination None reported or observed  Judgment Poor  Confusion None  Danger to Self  Current suicidal ideation? Denies  Danger to Others  Danger to Others None reported or observed   D. Pt presents with a brighter affect,  and reports that her thoughts have improved since admission, although pt does continue to endorse poor concentration. Pt has been visible at times in the milieu throughout the shift, and has been observed attending groups in the dayroom this am. Per pt's self inventory, pt rated her depression, hopelessness and anxiety a 7/8/4, respectively. Pt reported that her goal today was "to stay on meds". Pt currently denies SI/HI and AVH  A. Labs and vitals monitored. Pt given and educated on medications. Pt supported emotionally and encouraged to express concerns and ask questions.   R. Pt remains safe with 15 minute checks. Will continue POC.

## 2021-04-09 NOTE — Progress Notes (Signed)
[facility] MD Progress Note  04/09/2021 10:10 AM Emily [name]  MRN:  [id number]  Subjective: Emily [name] reports, I'm doing okay. My mood is good, no depression, just a little anxiety about getting discharged. I worry about getting out there again. I'm doing well on the medicines. The group sessions today was good. It talked about how not to be angry. I need to re-start my gabapentin again".  Reason for admission:  Emily [name] is a 35 year old female with a history of substance abuse, schizophrenia and bipolar disorder admitted voluntarily from [place] ED for suicidal ideation without a plan and worsening depression and anxiety.  She also endorsed auditory hallucinations on presentation to the ED.  Per previous reports, no changes: Objective: Medical record reviewed.  Patient's case discussed in detail with members of the treatment team.  I met with and evaluated the patient on the unit today for follow-up.  She continues to improve.  Patient is alert with improved eye contact and superficially coherent thought processes.  No psychotic content is elicited and she does not appear to attend or respond to internal stimuli. Cognition is improved overall and patient denies episodes of confusion.  She is oriented to self, situation, city, month and year.  Memory is improved.  Patient states her mood is "okay" but acknowledges feeling depressed at times.  I encouraged her to allow me to restart her sertraline tomorrow for depression but the patient declined.  She denies passive wish for death, SI, AI, PI, AH or VH.  Patient denies any side effects from her medications.  She denies any physical problems.  I discussed with patient her hepatitis C infection and the adverse effects infection could have on her liver.  I advised her that she should follow-up after discharge in outpatient clinic regarding hepatitis C infection.  Patient appeared apathetic about hep C positive diagnosis and outpatient treatment for it.  Right  foot exam and reported area of blister/callus shows no signs of infection.  Patient slept 6.75 hours last night.  Lab results today include HCV ab Reactive; >11.0.  GC/Chlamydia testing results still pending.  Chart notes document that patient has been visible in the day room, pleasant and reported feeling much better.  She has attended and participated in groups today.  She is taking scheduled medications as prescribed.  Principal Problem: Altered mental state Diagnosis: Principal Problem:   Altered mental state Active Problems:   Amphetamine use disorder, severe (HCC)   Hepatitis C antibody positive in blood   Substance abuse ([place])  Total Time spent with patient: 20 minutes  Past Psychiatric History: See admission H&P  Past Medical History:  Past Medical History:  Diagnosis Date   Anxiety    Anxiety    Depression    History reviewed. No pertinent surgical history. Family History: History reviewed. No pertinent family history. Family Psychiatric  History: See admission H&P Social History:  Social History   Substance and Sexual Activity  Alcohol Use Yes   Comment: 2-3 drinks but none recently per pt report     Social History   Substance and Sexual Activity  Drug Use No    Social History   Socioeconomic History   Marital status: Single    Spouse name: Not on file   Number of children: Not on file   Years of education: Not on file   Highest education level: Not on file  Occupational History   Not on file  Tobacco Use   Smoking status: Every Day  Passive exposure: Past   Smokeless tobacco: Never  Substance and Sexual Activity   Alcohol use: Yes    Comment: 2-3 drinks but none recently per pt report   Drug use: No   Sexual activity: Not Currently  Other Topics Concern   Not on file  Social History Narrative   Not on file   Social Determinants of Health   Financial Resource Strain: Not on file  Food Insecurity: Not on file  Transportation Needs: Not on file   Physical Activity: Not on file  Stress: Not on file  Social Connections: Not on file   Additional Social History:                         Sleep: Good  Appetite:  Good  Current Medications: Current Facility-Administered Medications  Medication Dose Route Frequency Provider Last Rate Last Admin   acetaminophen (TYLENOL) tablet 650 mg  650 mg Oral Q6H PRN Nelda Marseille, Amy E, MD   650 mg at 04/07/21 1712   alum & mag hydroxide-simeth (MAALOX/MYLANTA) 200-200-20 MG/5ML suspension 15 mL  15 mL Oral Q6H PRN Nelda Marseille, Amy E, MD       cephALEXin (KEFLEX) capsule 250 mg  250 mg Oral Q8H Arthor Captain, MD   250 mg at 04/09/21 6812   feeding supplement (ENSURE ENLIVE / ENSURE PLUS) liquid 237 mL  237 mL Oral BID BM Derrill Center, NP   237 mL at 04/08/21 1500   haloperidol (HALDOL) tablet 1 mg  1 mg Oral BID Arthor Captain, MD   1 mg at 04/09/21 7517   hydrOXYzine (ATARAX/VISTARIL) tablet 25 mg  25 mg Oral Q6H PRN Arthor Captain, MD   25 mg at 04/08/21 2124   magnesium hydroxide (MILK OF MAGNESIA) suspension 5 mL  5 mL Oral Daily PRN Harlow Asa, MD       multivitamin with minerals tablet 1 tablet  1 tablet Oral Daily Harlow Asa, MD   1 tablet at 04/09/21 0017   nicotine (NICODERM CQ - dosed in mg/24 hours) patch 14 mg  14 mg Transdermal Daily Sharma Covert, MD   14 mg at 04/09/21 0755   pantoprazole (PROTONIX) EC tablet 40 mg  40 mg Oral Daily Viann Fish E, MD   40 mg at 04/09/21 4944   thiamine tablet 100 mg  100 mg Oral Daily Nelda Marseille, Amy E, MD   100 mg at 04/09/21 9675   traZODone (DESYREL) tablet 50 mg  50 mg Oral QHS PRN Derrill Center, NP   50 mg at 04/08/21 2124    Lab Results:  No results found for this or any previous visit (from the past 59 hour(s)).   Blood Alcohol level:  Lab Results  Component Value Date   ETH <5 91/63/8466    Metabolic Disorder Labs: Lab Results  Component Value Date   HGBA1C 4.5 (L) 04/04/2021   MPG 82.45 04/04/2021    MPG 82.45 04/02/2021   Lab Results  Component Value Date   PROLACTIN 3.5 (L) 04/02/2021   Lab Results  Component Value Date   CHOL 180 04/04/2021   TRIG 62 04/04/2021   HDL 64 04/04/2021   CHOLHDL 2.8 04/04/2021   VLDL 12 04/04/2021   LDLCALC 104 (H) 04/04/2021    Physical Findings: AIMS: Facial and Oral Movements Muscles of Facial Expression: None, normal Lips and Perioral Area: None, normal Jaw: None, normal Tongue: None, normal,Extremity Movements Upper (arms,  wrists, hands, fingers): None, normal Lower (legs, knees, ankles, toes): None, normal, Trunk Movements Neck, shoulders, hips: None, normal, Overall Severity Severity of abnormal movements (highest score from questions above): None, normal Incapacitation due to abnormal movements: None, normal Patient's awareness of abnormal movements (rate only patient's report): No Awareness, Dental Status Current problems with teeth and/or dentures?: Yes Does patient usually wear dentures?: Yes  CIWA:  CIWA-Ar Total: 0 COWS:  COWS Total Score: 1  Musculoskeletal: Strength & Muscle Tone: within normal limits Gait & Station: normal Patient leans: N/A  Psychiatric Specialty Exam:  Presentation  General Appearance: Appropriate for Environment  Eye Contact:Good  Speech:Normal Rate; Other (comment) (limited amount; better articulation with dentures in place)  Speech Volume:Normal  Handedness:Right   Mood and Affect  Mood:Euthymic ("okay" per patient; Apathetic)  Affect:Constricted   Thought Process  Thought Processes:Coherent  Descriptions of Associations:Intact  Orientation:Full (Time, Place and Person)  Thought Content:Logical  History of Schizophrenia/Schizoaffective disorder:No data recorded Duration of Psychotic Symptoms:No data recorded Hallucinations:Hallucinations: None  Ideas of Reference:None  Suicidal Thoughts:Suicidal Thoughts: No  Homicidal Thoughts:Homicidal Thoughts: No   Sensorium   Memory:Immediate Fair; Recent Fair; Remote Fair  Judgment:Fair  Insight:Shallow   Executive Functions  Concentration:Fair  Attention Span:Fair  [place]   Psychomotor Activity  Psychomotor Activity:Psychomotor Activity: Normal   Assets  Assets:Communication Skills; Housing; Social Support   Sleep  Sleep:Sleep: Good Number of Hours of Sleep: 6.75    Physical Exam: Physical Exam Vitals and nursing note reviewed.  Constitutional:      General: She is not in acute distress.    Appearance: She is not diaphoretic.  HENT:     Head: Normocephalic.  Cardiovascular:     Rate and Rhythm: Normal rate.  Pulmonary:     Effort: Pulmonary effort is normal.  Neurological:     General: No focal deficit present.     Mental Status: She is alert.  Right foot exam and no signs infection at area of healing blister on third toe.  Review of Systems  Constitutional:  Negative for chills, diaphoresis and fever.  HENT:  Negative for sore throat.   Eyes: Negative.   Respiratory:  Negative for cough and shortness of breath.   Cardiovascular:  Negative for chest pain and palpitations.  Gastrointestinal:  Negative for constipation, diarrhea, nausea and vomiting.  Genitourinary: Negative.   Musculoskeletal:  Positive for back pain.       Positive for chronic back pain  Skin:  Negative for rash.  Neurological:  Negative for dizziness, tremors, seizures, weakness and headaches.       Positive for neuropathy in feet  Psychiatric/Behavioral:  Positive for substance abuse. Negative for hallucinations and suicidal ideas. The patient is not nervous/anxious and does not have insomnia.   Blood pressure 95/82, pulse (!) 118, temperature 98.2 F (36.8 C), temperature source Oral, resp. rate 16, height _0  (1.6 m), weight 46.7 kg, SpO2 100 %. Body mass index is 18.25 kg/m.   Treatment Plan Summary:  Patient's cognition is improving. Daily  contact with patient to assess and evaluate symptoms and progress in treatment and Medication management  Continue every 15-minute safety checks  Encourage participation in group therapy and therapeutic milieu  Altered mental status/psychosis -Continue Haloperidol 1 mg twice daily.    -Head CT performed 160 7371 showed "Unremarkable noncontrast CT appearance of the brain.  No evidence of acute intracranial abnormality.  Left sphenoid sinusitis." -Lab results from evening draw on 04/05/2021  showed mildly elevated ammonia.  Results from [place] testing and GC/chlamydia testing are pending  Depression -Continue off sertraline as patient declines to take.  Substance use disorder -No signs or symptoms of withdrawal from opiates or alcohol.  -Discontinued COWS protocol with comfort meds to cover for withdrawal from opiates -Discontinued CIWA protocol with PRN lorazepam and comfort meds to cover for possible withdrawal from alcohol or benzodiazepines  Insomnia -Continue trazodone 50 mg QHS PRN  Anxiety -Continue hydroxyzine 61m Q6H PRN. -Initiated: gabapentin 100 mg po tid (Patient reports high anxiety levels in anticipation for discharge, has asked for gabapentin. Historically, was on it at some point per chart review.  Right foot lesion -Examined today and no signs of infection -Continue Keflex 250 mg Q8H x 7 day course.   Discharge planning in progress.  Patient would benefit from participation in substance abuse treatment after discharge but is not motivated to participate in substance abuse treatment at this time.  Patient will need to follow-up with outpatient primary care provider as well as outpatient psychiatric provider after discharge.  I have advised patient to follow-up in liver clinic/ID clinic for hepatitis C +.  ALindell Spar NP, pmhnp, fnp-bc. 04/09/2021, 10:10 AM

## 2021-04-09 NOTE — [facility] Group Notes (Signed)
Adult Psychoeducational Group Note  Date:  04/09/2021 Time:  10:14 AM  Group Topic/Focus:  Goals Group:   The focus of this group is to help patients establish daily goals to achieve during treatment and discuss how the patient can incorporate goal setting into their daily lives to aide in recovery.  Participation Level:  Minimal  Participation Quality:  Attentive  Affect:  Appropriate  Cognitive:  Appropriate  Insight: Improving  Engagement in Group:  Engaged  Modes of Intervention:  Education  Additional Comments:  Pt goal today is to keep in touch with her feelings.Pt was not sure how she would completed her goal.  [employer], Sharen Counter 04/09/2021, 10:14 AM

## 2021-04-09 NOTE — [facility] Group Notes (Signed)
LCSW Group Therapy Note  04/09/2021   10:00-11:00am   Type of Therapy and Topic:  Group Therapy: Anger Cues and Responses  Participation Level:  Active   Description of Group:   In this group, patients learned how to recognize the physical, cognitive, emotional, and behavioral responses they have to anger-provoking situations.  They identified something that frequently makes them angry and how they react.  They analyzed how their reaction is possibly beneficial and how it is possibly unhelpful.  The group discussed a variety of healthier coping skills that could help with such a situation in the future.  Focus was placed on how helpful it is to recognize the underlying emotions to our anger, because working on those can lead to a more permanent solution as well as our ability to focus on the important rather than the urgent.  Therapeutic Goals: Patients will remember their last incident of anger and how they felt emotionally and physically, what their thoughts were at the time, and how they behaved. Patients will identify how their behavior at that time worked for them, as well as how it worked against them. Patients will explore possible new behaviors to use in future anger situations. Patients will learn that anger itself is normal and cannot be eliminated, and that healthier reactions can assist with resolving conflict rather than worsening situations.  Summary of Patient Progress:  The patient shared that her frequent cause of anger is trying to explain herself, but having to do it over and over because people are not listening, and said her method of dealing with this is to "stay pissy all the time."  She listened attentively throughout the group and made several comments, mostly on topic.  Therapeutic Modalities:   Cognitive Behavioral Therapy  Lynnell Chad

## 2021-04-09 NOTE — Progress Notes (Signed)
Adult Psychoeducational Group Note  Date:  04/09/2021 Time:  8:38 PM  Group Topic/Focus:  Wrap-Up Group:   The focus of this group is to help patients review their daily goal of treatment and discuss progress on daily workbooks.  Participation Level:  Active  Participation Quality:  Appropriate  Affect:  Appropriate  Cognitive:  Oriented  Insight: Appropriate  Engagement in Group:  Engaged  Modes of Intervention:  Education  Additional Comments:  Patient attended and participated in group tonight. She report that meeting new people was the most significant thing happen to her today.  Lita Mains [facility] 04/09/2021, 8:38 PM

## 2021-04-10 DIAGNOSIS — F29 Unspecified psychosis not due to a substance or known physiological condition: Secondary | ICD-10-CM | POA: Diagnosis not present

## 2021-04-10 LAB — HCV RNA QUANT
HCV Quantitative Log: 5.111 log10 IU/mL (ref >1.70)
HCV Quantitative: 129000 IU/mL (ref >50)

## 2021-04-10 NOTE — Progress Notes (Signed)
   04/09/21 2126  Psych Admission Type (Psych Patients Only)  Admission Status Voluntary  Psychosocial Assessment  Patient Complaints Depression  Eye Contact Fair  Facial Expression Flat  Affect Appropriate to circumstance;Flat  Speech Logical/coherent  Interaction Assertive;Minimal;Guarded  Motor Activity Slow  Appearance/Hygiene Unremarkable  Behavior Characteristics Cooperative;Appropriate to situation  Mood Anxious;Pleasant  Thought Process  Coherency WDL  Content WDL  Delusions None reported or observed  Perception WDL  Hallucination None reported or observed  Judgment Poor  Confusion None  Danger to Self  Current suicidal ideation? Denies  Danger to Others  Danger to Others None reported or observed

## 2021-04-10 NOTE — [facility] Group Notes (Signed)
[facility] LCSW Group Therapy Note  04/10/2021    Type of Therapy and Topic:  Group Therapy:  Adding Supports Including Yourself  Participation Level:  Minimal   Description of Group:   Patients in this group were introduced to the concept that additional supports including self-support are an essential part of recovery.  Patients listed their current healthy and unhealthy supports, and discussed the difference between the two.   Several songs were played and a group discussion ensued in which patients stated they could relate to the songs which inspired them to realize they have be willing to help themselves in order to succeed, because other people cannot achieve sobriety or stability for them.  Parents were encouraged toward self-advocacy and self-support as part of their recovery.  They discussed their reactions to these songs' messages, which were positive and hopeful.  Before group ended, they identified the supports they believe they need to add to their lives to achieve their goals at discharge.   Therapeutic Goals: 1)  explain the difference between healthy and unhealthy supports and discuss what specific supports are currently in patients' lives 2)  demonstrate the importance of being a key part of one's own support system 3)  discuss the need for appropriate boundaries with supports 4)  elicit ideas from patients about supports that need to be added in order to achieve goals   Summary of Patient Progress:   The patient arrived only for the last 5 minutes of group.  Therapeutic Modalities:   Motivational Interviewing Activity  Lynnell Chad

## 2021-04-10 NOTE — Progress Notes (Addendum)
   04/10/21 1200  Psych Admission Type (Psych Patients Only)  Admission Status Voluntary  Psychosocial Assessment  Patient Complaints Anxiety  Eye Contact Fair  Facial Expression Flat  Affect Appropriate to circumstance;Flat  Speech Logical/coherent  Interaction Assertive;Minimal;Guarded  Motor Activity Slow  Appearance/Hygiene Unremarkable  Behavior Characteristics Cooperative;Appropriate to situation  Mood Anxious;Pleasant  Thought Process  Coherency WDL  Content WDL  Delusions None reported or observed  Perception WDL  Hallucination None reported or observed  Judgment Poor  Confusion None  Danger to Self  Current suicidal ideation? Denies  Danger to Others  Danger to Others None reported or observed   Pt observed in the milieu throughout the shift interacting appropriately with peers. Pt reported some anxiety about discharge, but stated that she's ready. Pt has been compliant with meds and denies SI/HI and A/VH

## 2021-04-10 NOTE — Progress Notes (Addendum)
[facility] MD Progress Note  04/10/2021 10:26 AM Emily [name]  MRN:  [id number]  Subjective: Emily [name] reports, I feel good, I'm doing good. I'm sleeping well. I'm not feeling depressed or nothing. I'm feeling a little anxious about going home. Thinking about going home is making me a little nervous, not that something is wrong, it is just that I'm going home. I'm taking my medicines, no side effects. The medicines are helping".  Reason for admission:  Emily [name] is a 35 year old female with a history of substance abuse, schizophrenia and bipolar disorder admitted voluntarily from [place] ED for suicidal ideation without a plan and worsening depression and anxiety.  She also endorsed auditory hallucinations on presentation to the ED. Objective: Emily [name] is seen, chart reviewed. The chart findings discussed with the treatment team. She presents alert, oriented & aware of situation. She continues to improve. Her thought content/process is coherent & goal oriented. No psychotic content is elicited and she does not appear to attend or respond to internal stimuli. Cognition has improved overall and patient denies any episodes of confusion. Her Memory has improved remarkably. She denies any symptoms of depression or anxiety today. She seem to realize that her discharge date is approaching & she is a little apprehensive about discharge. See her subjective reports above. The only way she could explain her feeling of apprehension is by saying, "thinking about going home is making  me nervous". But she does not give any reason, it could be that she is worried about relapse once discharged. Emily [name] is doing well on her medications. She was restarted on gabapentin at 100 mg po tid yesterday for agitation. Historically, she was on a high dose of gabapentin in the past.  She denies passive wish for death, SI, AI, PI, AH or VH.  Patient denies any side effects from her medications.  She denies any physical problems. She had a  discussion with the attending psychiatrist earlier about her hepatitis C infection and the adverse effects infection could have on her liver.  She has been advised that she should follow-up after discharge in outpatient clinic regarding hepatitis C infection.  Patient appeared apathetic about hep C positive diagnosis and outpatient treatment for it.  Right foot exam and reported area of blister/callus shows no signs of infection. She continues her antibiotic therapy as recommended. Patient slept 6.75 hours last night.  Lab results previously included HCV ab Reactive; >11.0.  GC/Chlamydia testing results still pending.  Chart notes document that patient has been visible in the day room, pleasant and reported feeling much better.  She has been attending and participating in groups today.  She is taking scheduled medications as prescribed. Patient seem to be at her baseline mentally, emotionally & physically. Denies any new issues or concerns. Could be a candidate for discharge tomorrow if still stable.   Principal Problem: Altered mental state Diagnosis: Principal Problem:   Altered mental state Active Problems:   Amphetamine use disorder, severe (HCC)   Hepatitis C antibody positive in blood   Substance abuse (HCC)  Total Time spent with patient: 20 minutes  Past Psychiatric History: See admission H&P  Past Medical History:  Past Medical History:  Diagnosis Date   Anxiety    Anxiety    Depression    History reviewed. No pertinent surgical history. Family History: History reviewed. No pertinent family history. Family Psychiatric  History: See admission H&P Social History:  Social History   Substance and Sexual Activity  Alcohol Use Yes  Comment: 2-3 drinks but none recently per pt report     Social History   Substance and Sexual Activity  Drug Use No    Social History   Socioeconomic History   Marital status: Single    Spouse name: Not on file   Number of children: Not on file    Years of education: Not on file   Highest education level: Not on file  Occupational History   Not on file  Tobacco Use   Smoking status: Every Day    Passive exposure: Past   Smokeless tobacco: Never  Substance and Sexual Activity   Alcohol use: Yes    Comment: 2-3 drinks but none recently per pt report   Drug use: No   Sexual activity: Not Currently  Other Topics Concern   Not on file  Social History Narrative   Not on file   Social Determinants of Health   Financial Resource Strain: Not on file  Food Insecurity: Not on file  Transportation Needs: Not on file  Physical Activity: Not on file  Stress: Not on file  Social Connections: Not on file   Additional Social History:   Sleep: Good  Appetite:  Good  Current Medications: Current Facility-Administered Medications  Medication Dose Route Frequency Provider Last Rate Last Admin   acetaminophen (TYLENOL) tablet 650 mg  650 mg Oral Q6H PRN Mason Jim, Amy E, MD   650 mg at 04/07/21 1712   alum & mag hydroxide-simeth (MAALOX/MYLANTA) 200-200-20 MG/5ML suspension 15 mL  15 mL Oral Q6H PRN Mason Jim, Amy E, MD       cephALEXin (KEFLEX) capsule 250 mg  250 mg Oral Q8H Claudie Revering, MD   250 mg at 04/10/21 2841   feeding supplement (ENSURE ENLIVE / ENSURE PLUS) liquid 237 mL  237 mL Oral BID BM Oneta Rack, NP   237 mL at 04/10/21 1009   gabapentin (NEURONTIN) capsule 100 mg  100 mg Oral TID Armandina Stammer I, NP   100 mg at 04/10/21 3244   haloperidol (HALDOL) tablet 1 mg  1 mg Oral BID Claudie Revering, MD   1 mg at 04/10/21 0102   hydrOXYzine (ATARAX/VISTARIL) tablet 25 mg  25 mg Oral Q6H PRN Claudie Revering, MD   25 mg at 04/08/21 2124   magnesium hydroxide (MILK OF MAGNESIA) suspension 5 mL  5 mL Oral Daily PRN Comer Locket, MD       multivitamin with minerals tablet 1 tablet  1 tablet Oral Daily Comer Locket, MD   1 tablet at 04/10/21 7253   nicotine (NICODERM CQ - dosed in mg/24 hours) patch 14 mg  14 mg  Transdermal Daily Antonieta Pert, MD   14 mg at 04/10/21 6644   pantoprazole (PROTONIX) EC tablet 40 mg  40 mg Oral Daily Bartholomew Crews E, MD   40 mg at 04/10/21 0347   thiamine tablet 100 mg  100 mg Oral Daily Bartholomew Crews E, MD   100 mg at 04/10/21 4259   traZODone (DESYREL) tablet 50 mg  50 mg Oral QHS PRN Oneta Rack, NP   50 mg at 04/08/21 2124    Lab Results:  No results found for this or any previous visit (from the past 48 hour(s)).   Blood Alcohol level:  Lab Results  Component Value Date   ETH <5 10/21/2015   Metabolic Disorder Labs: Lab Results  Component Value Date   HGBA1C 4.5 (L) 04/04/2021   MPG  82.45 04/04/2021   MPG 82.45 04/02/2021   Lab Results  Component Value Date   PROLACTIN 3.5 (L) 04/02/2021   Lab Results  Component Value Date   CHOL 180 04/04/2021   TRIG 62 04/04/2021   HDL 64 04/04/2021   CHOLHDL 2.8 04/04/2021   VLDL 12 04/04/2021   LDLCALC 104 (H) 04/04/2021   Physical Findings: AIMS: Facial and Oral Movements Muscles of Facial Expression: None, normal Lips and Perioral Area: None, normal Jaw: None, normal Tongue: None, normal,Extremity Movements Upper (arms, wrists, hands, fingers): None, normal Lower (legs, knees, ankles, toes): None, normal, Trunk Movements Neck, shoulders, hips: None, normal, Overall Severity Severity of abnormal movements (highest score from questions above): None, normal Incapacitation due to abnormal movements: None, normal Patient's awareness of abnormal movements (rate only patient's report): No Awareness, Dental Status Current problems with teeth and/or dentures?: Yes Does patient usually wear dentures?: Yes  CIWA:  CIWA-Ar Total: 0 COWS:  COWS Total Score: 1  Musculoskeletal: Strength & Muscle Tone: within normal limits Gait & Station: normal Patient leans: N/A  Psychiatric Specialty Exam:  Presentation  General Appearance: Appropriate for Environment; Casual; Fairly Groomed  Eye  Contact:Good  Speech:Normal Rate  Speech Volume:Normal  Handedness:Right  Mood and Affect  Mood:Euthymic (Hopeful)  Affect:Appropriate; Congruent  Thought Process  Thought Processes:Coherent; Goal Directed  Descriptions of Associations:Intact  Orientation:Full (Time, Place and Person)  Thought Content:Logical  History of Schizophrenia/Schizoaffective disorder: No Duration of Psychotic Symptoms: NA Hallucinations:Hallucinations: None  Ideas of Reference:None  Suicidal Thoughts:Suicidal Thoughts: No SI Passive Intent and/or Plan: Without Intent; Without Plan; Without Means to Carry Out; Without Access to Means  Homicidal Thoughts:Homicidal Thoughts: No  Sensorium  Memory:Immediate Fair; Recent Fair; Remote Fair  Judgment:Fair  Insight:Present  Executive Functions  Concentration:Good  Attention Span:Good  Recall:Fair  Fund of Knowledge:Fair  Language:Good  Psychomotor Activity  Psychomotor Activity:Psychomotor Activity: Normal  Assets  Assets:Communication Skills; Desire for Improvement; Housing; Resilience; Social Support  Sleep  Sleep:Sleep: Good Number of Hours of Sleep: 6.75  Physical Exam: Physical Exam Vitals and nursing note reviewed.  Constitutional:      General: She is not in acute distress.    Appearance: She is not diaphoretic.  HENT:     Head: Normocephalic.     Nose: Nose normal.     Mouth/Throat:     Pharynx: Oropharynx is clear.  Eyes:     Pupils: Pupils are equal, round, and reactive to light.  Cardiovascular:     Rate and Rhythm: Normal rate.     Pulses: Normal pulses.  Pulmonary:     Effort: Pulmonary effort is normal.  Genitourinary:    Comments: Allergies: Bee venom Musculoskeletal:        General: Normal range of motion.     Cervical back: Normal range of motion.  Skin:    General: Skin is warm and dry.  Neurological:     General: No focal deficit present.     Mental Status: She is alert and oriented to person,  place, and time.  Right foot exam and no signs infection at area of healing blister on third toe.  Review of Systems  Constitutional:  Negative for chills, diaphoresis and fever.  HENT:  Negative for sore throat.   Eyes: Negative.   Respiratory:  Negative for cough and shortness of breath.   Cardiovascular:  Negative for chest pain and palpitations.  Gastrointestinal:  Negative for constipation, diarrhea, nausea and vomiting.  Genitourinary: Negative.   Musculoskeletal:  Negative  for back pain.       Positive for chronic back pain  Skin:  Negative for rash.  Neurological:  Negative for dizziness, tremors, seizures, weakness and headaches.       Positive for neuropathy in feet  Psychiatric/Behavioral:  Positive for substance abuse (Hx. opioid & methamphetamine use disorder.). Negative for hallucinations and suicidal ideas. The patient is not nervous/anxious and does not have insomnia.   Blood pressure 96/73, pulse (!) 106, temperature (!) 97.3 F (36.3 C), temperature source Oral, resp. rate 16, height 5\' 3"  (1.6 m), weight 46.7 kg, SpO2 100 %. Body mass index is 18.25 kg/m.  Treatment Plan Summary:  Patient's cognition is improving. Daily contact with patient to assess and evaluate symptoms and progress in treatment and Medication management.  Continue inpatient hospitalization.  Will continue today 04/10/2021 plan as below except where it is noted.   Continue every 15-minute safety checks Encourage participation in group therapy and therapeutic milieu  Altered mental status/psychosis -Continue Haloperidol 1 mg twice daily.    -Head CT performed 070 1222 showed "Unremarkable noncontrast CT appearance of the brain.  No evidence of acute intracranial abnormality.  Left sphenoid sinusitis." -Lab results from evening draw on 04/05/2021 showed mildly elevated ammonia.  Results from HCVRNA quant testing and GC/chlamydia testing are pending  Depression -Continue off sertraline as patient  declines to take.  Substance use disorder -No signs or symptoms of withdrawal from opiates or alcohol.  -Discontinued COWS protocol with comfort meds to cover for withdrawal from opiates -Discontinued CIWA protocol with PRN lorazepam and comfort meds to cover for possible withdrawal from alcohol or benzodiazepines  Insomnia -Continue trazodone 50 mg QHS PRN  Anxiety -Continue hydroxyzine 25mg  Q6H PRN. -Continue gabapentin 100 mg po tid (Patient reports high anxiety levels in anticipation for discharge, has asked for gabapentin. Historically, was on it at some point per chart review.  Right foot lesion -Examined today and no signs of infection -Continue Keflex 250 mg Q8H x 7 day course.   Discharge planning in progress.  Patient would benefit from participation in substance abuse treatment after discharge but is not motivated to participate in substance abuse treatment at this time.  Patient will need to follow-up with outpatient primary care provider as well as outpatient psychiatric provider after discharge.  I have advised patient to follow-up in liver clinic/ID clinic for hepatitis C +.  06/06/2021, NP, pmhnp, fnp-bc. 04/10/2021, 10:26 AM

## 2021-04-10 NOTE — Progress Notes (Signed)
   04/10/21 2128  Psych Admission Type (Psych Patients Only)  Admission Status Voluntary  Psychosocial Assessment  Patient Complaints None  Eye Contact Fair;Brief  Facial Expression Flat  Affect Appropriate to circumstance;Flat  Speech Logical/coherent  Interaction Assertive;Minimal;Guarded  Motor Activity Slow  Appearance/Hygiene Unremarkable  Behavior Characteristics Cooperative;Appropriate to situation  Mood Pleasant  Thought Process  Coherency WDL  Content WDL  Delusions None reported or observed  Perception WDL  Hallucination None reported or observed  Judgment Poor  Confusion None  Danger to Self  Current suicidal ideation? Denies  Danger to Others  Danger to Others None reported or observed

## 2021-04-11 DIAGNOSIS — F32A Depression, unspecified: Secondary | ICD-10-CM

## 2021-04-11 DIAGNOSIS — F1520 Other stimulant dependence, uncomplicated: Secondary | ICD-10-CM

## 2021-04-11 LAB — HCV RT-PCR, QUANT (NON-GRAPH)

## 2021-04-11 LAB — HCV AB W REFLEX TO QUANT PCR: HCV Ab: >11 s/co ratio — ABNORMAL HIGH (ref 0.0–0.9)

## 2021-04-11 MED ORDER — HALOPERIDOL 1 MG PO TABS: 1.0000 mg | ORAL_TABLET | Freq: Two times a day (BID) | ORAL | 0 refills | Status: DC

## 2021-04-11 MED ORDER — SERTRALINE HCL 50 MG PO TABS
50.0000 mg | ORAL_TABLET | Freq: Every day | ORAL | Status: DC
  Administered 2021-04-11: 50 mg via ORAL
  Filled 2021-04-11 (×4): qty 1
  Filled 2021-04-11: qty 7

## 2021-04-11 MED ORDER — CEPHALEXIN 250 MG PO CAPS: 250.0000 mg | ORAL_CAPSULE | Freq: Three times a day (TID) | ORAL | 0 refills | Status: DC

## 2021-04-11 MED ORDER — SERTRALINE HCL 50 MG PO TABS: 50.0000 mg | ORAL_TABLET | Freq: Every day | ORAL | 0 refills | Status: DC

## 2021-04-11 NOTE — Progress Notes (Signed)
Discharge Note:   Pt discharged via safe transport to home at 1300.  Pt upon discharge is alert and oriented to person, place, time and situation. Pt denies suicidal and homicidal ideation, denies hallucinations, denies feelings of depression and anxiety. Pt was given discharge instructions and verbalized understanding of them; which included pt's follow up appts and discharge medication education, given prescriptions and sample meds from pharmacy. All personal belongings returned to pt upon discharge. Pt's affect was appropriately bright, voices no complaints, no distress noted or reported.

## 2021-04-11 NOTE — [facility] Group Notes (Addendum)
LCSW Group Therapy Note      04/11/2020:  1pm-2pm                Type of Therapy and Topic:  Group Therapy: Establishing Boundaries    Participation Level:  Did Not Attend     Description of Group:    In this group, patients learned how to define boundaries, discussed the different types or boundaries with examples.  They identified times that boundaries had been violated and how they reacted.  They analyzed how their reaction was possibly beneficial and how it was possibly unhelpful.  The group discussed how to set boundaries, respect others boundaries and communicate their boundaries. The group utilized a role play scenarios (working with a partner) and discussed how each person in the scenario could have reacted differently and what boundaries they need to implement to improve their life. Patients also discussed consequences to overstepping boundaries and lack of boundaries. Patients discussed how to establish boundaries with clear consequences. Patients will explore discussion questions that address media influence and why it is hard to set boundaries.       Therapeutic Goals:   Patients will define boundaries and explore (physical, personal space and language boundaries).  Patients will remember their last incident where their boundaries were violated and how they behaved.  Patients will practice empathy and understanding of other's boundaries and learn from others in group.  Patients will explore how they may have crossed another person's boundaries in the past.   Patients will learn healthy ways to set and communicate boundaries.  Patients will actively engage in group activity utilizing role play and critical thinking skills.      Summary of Patient Progress:  Did not attend     Therapeutic Modalities:    Cognitive Behavioral Therapy      Adea Geisel, LCSW, LCAS Clincal Social Worker  [facility]

## 2021-04-11 NOTE — Progress Notes (Signed)
  [facility] Adult Case Management Discharge Plan:  Will you be returning to the same living situation after discharge:  No. Will be staying with cousin At discharge, do you have transportation home?: No. Safe Transport will be arranged Do you have the ability to pay for your medications: No. Samples will be provided at discharge  Release of information consent forms completed and in the chart;  Patient's signature needed at discharge.  Patient to Follow up at:  Follow-up Information     [facility] Go on 04/13/2021.   Why: You have a hospital follow up appointment for therapy and medication management services on 04/13/21 at 8:30 am.  This appointment will be held in person.  Please enter through the side entrance. Contact information: [address] [place] [address] [phone]         [facility] Department. Go to.   Why: Walk-in or call to schedule appointment for services for physical health concerns. Contact information: [address],  [place], [place] [address]  Telephone: [phone]                Next level of care provider has access to [facility] Link:no  Safety Planning and Suicide Prevention discussed: Yes,  with cousin  Have you used any form of tobacco in the last 30 days? (Cigarettes, Smokeless Tobacco, Cigars, and/or Pipes): Yes  Has patient been referred to the Quitline?: Patient refused referral  Patient has been referred for addiction treatment: Pt. refused referral  Otelia Santee, LCSW 04/11/2021, 10:28 AM

## 2021-04-11 NOTE — BHH Suicide Risk Assessment (Signed)
[facility] Discharge Suicide Risk Assessment   Principal Problem: Altered mental state Discharge Diagnoses: Principal Problem:   Altered mental state Active Problems:   Amphetamine use disorder, severe (HCC)   Hepatitis C antibody positive in blood   Depression   Substance abuse (HCC)   Total Time spent with patient: 20 minutes  Musculoskeletal: Strength & Muscle Tone: within normal limits Gait & Station: normal Patient leans: N/A  Psychiatric Specialty Exam  Presentation  General Appearance: Appropriate for Environment; Fairly Groomed  Eye Contact:Good  Speech:Normal Rate  Speech Volume:Normal  Handedness:Right   Mood and Affect  Mood:Euthymic  Duration of Depression Symptoms: No data recorded Affect:Appropriate   Thought Process  Thought Processes:Coherent; Goal Directed  Descriptions of Associations:Intact  Orientation:Full (Time, Place and Person)  Thought Content:Logical  History of Schizophrenia/Schizoaffective disorder:No data recorded Duration of Psychotic Symptoms:No data recorded Hallucinations:Hallucinations: None  Ideas of Reference:None  Suicidal Thoughts:Suicidal Thoughts: No   Homicidal Thoughts:Homicidal Thoughts: No   Sensorium  Memory:Immediate Fair; Recent Fair; Remote Fair  Judgment:Fair  Insight:Present   Executive Functions  Concentration:Fair  Attention Span:Fair  Recall:Fair  Fund of Knowledge:Fair  Language:Good   Psychomotor Activity  Psychomotor Activity:Psychomotor Activity: Normal   Assets  Assets:Communication Skills; Desire for Improvement; Housing; Resilience; Social Support   Sleep  Sleep:Sleep: Good Number of Hours of Sleep: 6.75   Physical Exam: Physical Exam Vitals and nursing note reviewed.  Constitutional:      General: She is not in acute distress.    Appearance: She is not diaphoretic.  HENT:     Head: Normocephalic and atraumatic.  Pulmonary:     Effort: Pulmonary effort is  normal.  Neurological:     General: No focal deficit present.     Mental Status: She is alert and oriented to person, place, and time.   Review of Systems  Constitutional:  Negative for chills, diaphoresis and fever.  HENT:  Negative for sore throat.   Respiratory:  Negative for cough and shortness of breath.   Cardiovascular:  Negative for chest pain and palpitations.  Gastrointestinal:  Negative for constipation, diarrhea, nausea and vomiting.  Musculoskeletal:  Negative for joint pain and myalgias.  Skin:  Negative for rash.  Neurological:  Negative for dizziness, tremors, seizures and headaches.  Psychiatric/Behavioral:  Negative for hallucinations and suicidal ideas. The patient is not nervous/anxious and does not have insomnia.   All other systems reviewed and are negative.  Vital signs at 6:15 AM on 04/11/2021: BP 97/71 sitting and 88/75 standing; pulse 82 sitting and 108 standing; O2 sat 99% on room air; and temperature 98.3. Blood pressure (!) 88/75, pulse (!) 108, temperature 98.3 F (36.8 C), temperature source Oral, resp. rate 16, height 5\' 3"  (1.6 m), weight 46.7 kg, SpO2 99 %. Body mass index is 18.25 kg/m.  Mental Status Per Nursing Assessment::   On Admission:  Suicidal ideation indicated by patient  Demographic Factors:  Caucasian, Low socioeconomic status, and Unemployed  Loss Factors: Financial problems/change in socioeconomic status  Historical Factors: Impulsivity  Risk Reduction Factors:   Sense of responsibility to family, Living with another person, especially a relative, and Positive social support  Continued Clinical Symptoms:  Depression - improved Psychosis - resolved Previous Psychiatric Diagnoses and Treatments Medical Diagnoses and Treatments/Surgeries  Cognitive Features That Contribute To Risk:  Thought constriction (tunnel vision)    Suicide Risk:  Minimal acute risk: No identifiable suicidal ideation.  Patients presenting with no risk  factors but with morbid ruminations; may be classified  as minimal risk based on the severity of the depressive symptoms   Follow-up Information     Inc, [facility]. Go on 04/13/2021.   Why: You have a hospital follow up appointment for therapy and medication management services on 04/13/21 at 8:30 am.  This appointment will be held in person.  Please enter through the side entrance. Contact information: [address] [place] [address] [phone]         [facility] Department. Go to.   Why: Walk-in or call to schedule appointment for services for physical health concerns. Contact information: [address]  [place], [place] [address]  Telephone: [phone]                Plan Of Care/Follow-up recommendations:  Activity:  as tolerated  Other:  -Take medications as prescribed.   -Do not drink alcohol.  Do not use cannabis/marijuana or other drugs.   -Attend outpatient substance abuse treatment program and 12-step groups.   -Keep outpatient mental health follow-up appointments with therapist and psychiatrist.  -Follow-up with primary care provider or [facility] department regarding treatment of hepatitis C and other medical conditions.  Claudie Revering, MD 04/11/2021, 12:40 PM

## 2021-04-11 NOTE — Progress Notes (Signed)
Recreation Therapy Notes  Date: 7.18.22 Time: 0930 Location: 300 [address] Dayroom  Group Topic: Stress Management   Goal Area(s) Addresses:  Patient will actively participate in stress management techniques presented during session.  Patient will successfully identify benefit of practicing stress management post d/c.   Behavioral Response: Appropriate  Intervention: Guided exercise with ambient sound and script  Activity : Meditation  LRT provided education and instruction for the stress management technique of meditation.  Patients were asked to participate in the technique introduced during session.  LRT left the floor open for any questions or concerns of the patients.  Patients were given suggestions of ways to access scripts post d/c and encouraged to explore Youtube and other apps available on smartphones, tablets, and computers.   Education:  Stress Management, Discharge Planning.   Education Outcome: Acknowledges education  Clinical Observations/Feedback: Patient actively engaged in technique introduced, expressed no concerns at conclusion of group session.    Caroll Rancher, LRT/CTRS      Caroll Rancher A 04/11/2021 12:20 PM

## 2021-04-11 NOTE — Discharge Summary (Signed)
Physician Discharge Summary Note  Patient:  Emily [name] is an 35 y.o., female MRN:  [id number] DOB:  Jun 22, 1986 Patient phone:  [phone] (home)  Patient address:   [address] [place] [address],  Total Time spent with patient: 30 minutes  Date of Admission:  04/02/2021 Date of Discharge: 04/11/2021  Reason for Admission:  (From MD's admission note):  Emily [name] 35 year old Caucasian female presents after recent transfer from [facility] due to suicidal ideation.  Patient was seen and evaluated face-to-face. [name] is very minimal throughout this assessment and a poor historian. Mild though blocking noted. She is unengaged throughout this assessment answering questing with a sharp " yes or no."  Reports self injures behavior 1 week ago. " I cut my wrist because I am depressed."  Denies that she is followed by therapy and/or psychiatry currently.  Denied that she is prescribed any psychotropic medications currently or in the past. Denied previous inpatient admission or previous suicidal attempts.   Per chart review patient has a history with opioid and methamphetamine use/abuse. Pending legal charges. Patient reported " I just don't want to live anymore."  Patient reported helplessness and worsening depression. Denied auditory or visual hallucinations.     Discussed initiating medication to assist with mood. She was receptive and closed her eye. Was charted that patient had scattered thoughts and garbled speech on admission. UDS- . CMP: AST/ALT 36/26 and CBC; elevated lump at 45.4. Patient reported family history with mental illness, didn't elaborate.      Principal Problem: Altered mental state Discharge Diagnoses: Principal Problem:   Altered mental state Active Problems:   Amphetamine use disorder, severe (HCC)   Hepatitis C antibody positive in blood   Depression   Substance abuse (HCC)   Past Psychiatric History: See H&P  Past Medical History:  Past Medical History:   Diagnosis Date   Anxiety    Anxiety    Depression    History reviewed. No pertinent surgical history. Family History: History reviewed. No pertinent family history. Family Psychiatric  History: See H&P Social History:  Social History   Substance and Sexual Activity  Alcohol Use Yes   Comment: 2-3 drinks but none recently per pt report     Social History   Substance and Sexual Activity  Drug Use No    Social History   Socioeconomic History   Marital status: Single    Spouse name: Not on file   Number of children: Not on file   Years of education: Not on file   Highest education level: Not on file  Occupational History   Not on file  Tobacco Use   Smoking status: Every Day    Passive exposure: Past   Smokeless tobacco: Never  Substance and Sexual Activity   Alcohol use: Yes    Comment: 2-3 drinks but none recently per pt report   Drug use: No   Sexual activity: Not Currently  Other Topics Concern   Not on file  Social History Narrative   Not on file   Social Determinants of Health   Financial Resource Strain: Not on file  Food Insecurity: Not on file  Transportation Needs: Not on file  Physical Activity: Not on file  Stress: Not on file  Social Connections: Not on file    Hospital Course:  After the above admission evaluation, Gemma's presenting symptoms were noted. She was recommended for mood stabilization treatments. The medication regimen targeting those presenting symptoms were discussed with her & initiated  with her consent. She was started on Zoloft for depression and Haldol for psychosis. Her UDS and BAL on arrival to the ED were negative. She was however medicated, stabilized & discharged on the medications as listed on her discharge medication list below. Besides the mood stabilization treatments, [name] was also enrolled & participated in the group counseling sessions being offered & held on this unit. She learned coping skills. She presented no other  significant pre-existing medical issues that required treatment. She tolerated his treatment regimen without any adverse effects or reactions reported.   During the course of her hospitalization, the 15-minute checks were adequate to ensure patient's safety. [name] did not display any dangerous, violent or suicidal behavior on the unit.  She interacted with patients & staff appropriately, participated appropriately in the group sessions/therapies. Her medications were addressed & adjusted to meet her needs. She was recommended for outpatient follow-up care & medication management upon discharge to assure continuity of care & mood stability.  At the time of discharge patient is not reporting any acute suicidal/homicidal ideations. She feels more confident about her self-care & in managing her mental health. She currently denies any new issues or concerns. Education and supportive counseling provided throughout her hospital stay & upon discharge.   Today upon her discharge evaluation with the attending psychiatrist, [name] shares she is doing well. She denies any other specific concerns. She is sleeping well. Her appetite is good. She denies other physical complaints. She denies AH/VH, delusional thoughts or paranoia. She does not appear to be responding to any internal stimuli. She feels that her medications have been helpful & is in agreement to continue her current treatment regimen as recommended. She was able to engage in safety planning including plan to return to [facility] or contact emergency services if she feels unable to maintain her own safety or the safety of others. Pt had no further questions, comments, or concerns. She left [facility] with all personal belongings in no apparent distress. Transportation home per [employer].    Physical Findings: AIMS: Facial and Oral Movements Muscles of Facial Expression: None, normal Lips and Perioral Area: None, normal Jaw: None, normal Tongue: None,  normal,Extremity Movements Upper (arms, wrists, hands, fingers): None, normal Lower (legs, knees, ankles, toes): None, normal, Trunk Movements Neck, shoulders, hips: None, normal, Overall Severity Severity of abnormal movements (highest score from questions above): None, normal Incapacitation due to abnormal movements: None, normal Patient's awareness of abnormal movements (rate only patient's report): No Awareness, Dental Status Current problems with teeth and/or dentures?: Yes Does patient usually wear dentures?: Yes  CIWA:  CIWA-Ar Total: 0 COWS:  COWS Total Score: 1  Musculoskeletal: Strength & Muscle Tone: within normal limits Gait & Station: normal Patient leans: N/A   Psychiatric Specialty Exam:  Presentation  General Appearance: Appropriate for Environment; Fairly Groomed  Eye Contact:Good  Speech:Normal Rate  Speech Volume:Normal  Handedness:Right  Mood and Affect  Mood:Euthymic  Affect:Appropriate  Thought Process  Thought Processes:Coherent; Goal Directed  Descriptions of Associations:Intact  Orientation:Full (Time, Place and Person)  Thought Content:Logical  History of Schizophrenia/Schizoaffective disorder:No data recorded Duration of Psychotic Symptoms:No data recorded Hallucinations:Hallucinations: None  Ideas of Reference:None  Suicidal Thoughts:Suicidal Thoughts: No SI Passive Intent and/or Plan: Without Intent; Without Plan; Without Means to Carry Out; Without Access to Means  Homicidal Thoughts:Homicidal Thoughts: No  Sensorium  Memory:Immediate Fair; Recent Fair; Remote Fair  Judgment:Fair  Insight:Present  Executive Functions  Concentration:Fair  Attention Span:Fair  Recall:Fair  Fund of Knowledge:Fair  Language:Good  Psychomotor Activity  Psychomotor Activity:Psychomotor Activity: Normal  Assets  Assets:Communication Skills; Desire for Improvement; Housing; Resilience; Social Support  Sleep  Sleep:Sleep:  Good Number of Hours of Sleep: 6.75  Physical Exam: Physical Exam Vitals and nursing note reviewed.  Constitutional:      Appearance: Normal appearance.  HENT:     Head: Normocephalic and atraumatic.  Pulmonary:     Effort: Pulmonary effort is normal.  Musculoskeletal:     Cervical back: Normal range of motion.  Neurological:     General: No focal deficit present.     Mental Status: She is alert and oriented to person, place, and time.  Psychiatric:        Attention and Perception: Attention and perception normal. She does not perceive auditory or visual hallucinations.        Mood and Affect: Mood normal.        Speech: Speech normal.        Behavior: Behavior normal. Behavior is cooperative.        Thought Content: Thought content normal. Thought content is not paranoid or delusional. Thought content does not include homicidal or suicidal ideation. Thought content does not include homicidal or suicidal plan.        Cognition and Memory: Cognition normal.   Review of Systems  Constitutional: Negative.  Negative for fever.  HENT: Negative.  Negative for congestion, sinus pain and sore throat.   Respiratory: Negative.  Negative for cough and shortness of breath.   Cardiovascular: Negative.   Gastrointestinal: Negative.   Genitourinary: Negative.   Musculoskeletal: Negative.   Neurological: Negative.    Blood pressure (!) 88/75, pulse (!) 108, temperature 98.3 F (36.8 C), temperature source Oral, resp. rate 16, height 5\' 3"  (1.6 m), weight 46.7 kg, SpO2 99 %. Body mass index is 18.25 kg/m.   Social History   Tobacco Use  Smoking Status Every Day   Passive exposure: Past  Smokeless Tobacco Never   Tobacco Cessation:  A prescription for an FDA-approved tobacco cessation medication was offered at discharge and the patient refused   Blood Alcohol level:  Lab Results  Component Value Date   ETH <5 10/21/2015    Metabolic Disorder Labs:  Lab Results  Component Value  Date   HGBA1C 4.5 (L) 04/04/2021   MPG 82.45 04/04/2021   MPG 82.45 04/02/2021   Lab Results  Component Value Date   PROLACTIN 3.5 (L) 04/02/2021   Lab Results  Component Value Date   CHOL 180 04/04/2021   TRIG 62 04/04/2021   HDL 64 04/04/2021   CHOLHDL 2.8 04/04/2021   VLDL 12 04/04/2021   LDLCALC 104 (H) 04/04/2021    See Psychiatric Specialty Exam and Suicide Risk Assessment completed by Attending Physician prior to discharge.  Discharge destination:  Home  Is patient on multiple antipsychotic therapies at discharge:  No   Has Patient had three or more failed trials of antipsychotic monotherapy by history:  No  Recommended Plan for Multiple Antipsychotic Therapies: NA  Discharge Instructions     Diet - low sodium heart healthy   Complete by: As directed    Increase activity slowly   Complete by: As directed       Allergies as of 04/11/2021       Reactions   Bee Venom         Medication List     STOP taking these medications    OLANZapine 7.5 MG tablet Commonly known as: ZYPREXA  traZODone 100 MG tablet Commonly known as: DESYREL       TAKE these medications      Indication  cephALEXin 250 MG capsule Commonly known as: KEFLEX Take 1 capsule (250 mg total) by mouth every 8 (eight) hours.  Indication: Infection of the Skin and/or Skin Structures   haloperidol 1 MG tablet Commonly known as: HALDOL Take 1 tablet (1 mg total) by mouth 2 (two) times daily.  Indication: Psychosis   sertraline 50 MG tablet Commonly known as: ZOLOFT Take 1 tablet (50 mg total) by mouth daily. Start taking on: April 12, 2021  Indication: Major Depressive Disorder        Follow-up Information     Inc, [facility]. Go on 04/13/2021.   Why: You have a hospital follow up appointment for therapy and medication management services on 04/13/21 at 8:30 am.  This appointment will be held in person.  Please enter through the side entrance. Contact  information: [address] [place] [address] [phone]         [facility] Department. Go to.   Why: Walk-in or call to schedule appointment for services for physical health concerns. Contact information: [address],  [place], [place] [address]  Telephone: [phone]                Follow-up recommendations:  Activity:  as tolerated Diet:  Heart healthy  Comments:  Prescriptions were given at discharge.  Patient is agreeable with the discharge plan.  She was given an opportunity to ask questions.  She appears to feel comfortable with discharge and denies any current suicidal or homicidal thoughts.   Patient is instructed prior to discharge to: Take all medications as prescribed by her mental healthcare provider. Report any adverse effects and or reactions from the medicines to her outpatient provider promptly. Patient has been instructed & cautioned: To not engage in alcohol and or illegal drug use while on prescription medicines. In the event of worsening symptoms, patient is instructed to call the crisis hotline, 911 and or go to the nearest ED for appropriate evaluation and treatment of symptoms. To follow-up with her primary care provider for your other medical issues, concerns and or health care needs.   Signed: Laveda Abbe, NP 04/11/2021, 1:02 PM

## 2021-04-13 NOTE — BHH Group Notes (Signed)
Spiritual care group on grief and loss facilitated by chaplain Dyanne Carrel, [facility]   Group Goal:   Support / Education around grief and loss   Members engage in facilitated group support and psycho-social education.   Group Description:   Following introductions and group rules, group members engaged in facilitated group dialog and support around topic of loss, with particular support around experiences of loss in their lives. Group Identified types of loss (relationships / self / things) and identified patterns, circumstances, and changes that precipitate losses. Reflected on thoughts / feelings around loss, normalized grief responses, and recognized variety in grief experience. Group noted Worden's four tasks of grief in discussion.   Group drew on Adlerian / Rogerian, narrative, MI,   Patient Progress: Sybrina was in and out of group and somewhat agitated.  [employer], bcc Pager, [phone] 5:02 PM

## 2021-10-26 ENCOUNTER — Emergency Department (HOSPITAL_COMMUNITY)
Admission: EM | Admit: 2021-10-26 | Discharge: 2021-10-28 | Disposition: A | Discharge status: Other Acute Inpatient Hospital | Payer: Self-pay | Attending: Emergency Medicine | Admitting: Emergency Medicine

## 2021-10-26 ENCOUNTER — Emergency Department (HOSPITAL_COMMUNITY): Payer: Self-pay

## 2021-10-26 ENCOUNTER — Encounter (HOSPITAL_COMMUNITY): Payer: Self-pay | Admitting: Emergency Medicine

## 2021-10-26 DIAGNOSIS — R443 Hallucinations, unspecified: Secondary | ICD-10-CM

## 2021-10-26 DIAGNOSIS — N9489 Other specified conditions associated with female genital organs and menstrual cycle: Secondary | ICD-10-CM | POA: Insufficient documentation

## 2021-10-26 DIAGNOSIS — F209 Schizophrenia, unspecified: Secondary | ICD-10-CM | POA: Insufficient documentation

## 2021-10-26 DIAGNOSIS — Z20822 Contact with and (suspected) exposure to covid-19: Secondary | ICD-10-CM | POA: Insufficient documentation

## 2021-10-26 LAB — COMPREHENSIVE METABOLIC PANEL
ALT: 41 U/L (ref 0–44)
AST: 33 U/L (ref 15–41)
Albumin: 4.1 g/dL (ref 3.5–5.0)
Alkaline Phosphatase: 39 U/L (ref 38–126)
Anion gap: 7 (ref 5–15)
BUN: 11 mg/dL (ref 6–20)
CO2: 26 mmol/L (ref 22–32)
Calcium: 9.2 mg/dL (ref 8.9–10.3)
Chloride: 104 mmol/L (ref 98–111)
Creatinine, Ser: 0.69 mg/dL (ref 0.44–1.00)
GFR, Estimated: >60 mL/min (ref >60)
Glucose, Bld: 98 mg/dL (ref 70–99)
Potassium: 3.4 mmol/L — ABNORMAL LOW (ref 3.5–5.1)
Sodium: 137 mmol/L (ref 135–145)
Total Bilirubin: 0.4 mg/dL (ref 0.3–1.2)
Total Protein: 7.4 g/dL (ref 6.5–8.1)

## 2021-10-26 LAB — CBC WITH DIFFERENTIAL/PLATELET
Abs Immature Granulocytes: 0.02 10*3/uL (ref 0.00–0.07)
Basophils Absolute: 0 10*3/uL (ref 0.0–0.1)
Basophils Relative: 0 %
Eosinophils Absolute: 0.1 10*3/uL (ref 0.0–0.5)
Eosinophils Relative: 1 %
HCT: 42.9 % (ref 36.0–46.0)
Hemoglobin: 14 g/dL (ref 12.0–15.0)
Immature Granulocytes: 0 %
Lymphocytes Relative: 27 %
Lymphs Abs: 2.4 10*3/uL (ref 0.7–4.0)
MCH: 30.8 pg (ref 26.0–34.0)
MCHC: 32.6 g/dL (ref 30.0–36.0)
MCV: 94.3 fL (ref 80.0–100.0)
Monocytes Absolute: 0.5 10*3/uL (ref 0.1–1.0)
Monocytes Relative: 6 %
Neutro Abs: 5.8 10*3/uL (ref 1.7–7.7)
Neutrophils Relative %: 66 %
Platelets: 338 10*3/uL (ref 150–400)
RBC: 4.55 MIL/uL (ref 3.87–5.11)
RDW: 12.4 % (ref 11.5–15.5)
WBC: 8.9 10*3/uL (ref 4.0–10.5)
nRBC: 0 % (ref 0.0–0.2)

## 2021-10-26 LAB — URINALYSIS, MICROSCOPIC (REFLEX)

## 2021-10-26 LAB — I-STAT BETA HCG BLOOD, ED (MC, WL, AP ONLY): I-stat hCG, quantitative: <5 m[IU]/mL (ref <5)

## 2021-10-26 LAB — RESP PANEL BY RT-PCR (FLU A&B, COVID) ARPGX2
Influenza A by PCR: NEGATIVE
Influenza B by PCR: NEGATIVE
SARS Coronavirus 2 by RT PCR: NEGATIVE

## 2021-10-26 LAB — URINALYSIS, ROUTINE W REFLEX MICROSCOPIC
Bilirubin Urine: NEGATIVE
Glucose, UA: NEGATIVE mg/dL
Ketones, ur: NEGATIVE mg/dL
Leukocytes,Ua: NEGATIVE
Nitrite: NEGATIVE
Protein, ur: NEGATIVE mg/dL
Specific Gravity, Urine: 1.02 (ref 1.005–1.030)
pH: 7.5 (ref 5.0–8.0)

## 2021-10-26 LAB — ETHANOL: Alcohol, Ethyl (B): <10 mg/dL (ref <10)

## 2021-10-26 LAB — RAPID URINE DRUG SCREEN, HOSP PERFORMED
Amphetamines: NOT DETECTED
Barbiturates: NOT DETECTED
Benzodiazepines: NOT DETECTED
Cocaine: NOT DETECTED
Opiates: NOT DETECTED
Tetrahydrocannabinol: NOT DETECTED

## 2021-10-26 NOTE — ED Provider Notes (Signed)
[facility] EMERGENCY DEPARTMENT Provider Note   CSN: [id number] Arrival date & time: 10/26/21  1038     History  Chief Complaint  Patient presents with   Hallucinations   LEVEL 5 CAVEAT - PSYCHIATRIC DISORDER  Emily [name] is a 36 y.o. female with PMHx anxiety, depression, substance abuse, and schizophrenia who presents to the ED today with chief complaint of hallucinations.  Per triage note patient requesting for help and stated that she had been seeing things that just are not good.  History significantly limited as patient appears to be responding to internal stimuli.  She mentions something about her medications.  When asked if she has been taking her medication she states no.  She is unable to tell me how long she has not been taking them.  She is alert to self and place.   The history is provided by the patient and medical records.      Home Medications Prior to Admission medications   Medication Sig Start Date End Date Taking? Authorizing Provider  cephALEXin (KEFLEX) 250 MG capsule Take 1 capsule (250 mg total) by mouth every 8 (eight) hours. Patient not taking: Reported on 10/26/2021 04/11/21   Laveda Abbe, NP  haloperidol (HALDOL) 1 MG tablet Take 1 tablet (1 mg total) by mouth 2 (two) times daily. Patient not taking: Reported on 10/26/2021 04/11/21   Laveda Abbe, NP  sertraline (ZOLOFT) 50 MG tablet Take 1 tablet (50 mg total) by mouth daily. Patient not taking: Reported on 10/26/2021 04/12/21   Laveda Abbe, NP      Allergies    Bee venom    Review of Systems   Review of Systems  Unable to perform ROS: Psychiatric disorder  Psychiatric/Behavioral:  Positive for hallucinations.    Physical Exam Updated Vital Signs BP 92/66    Pulse 81    Temp 97.8 F (36.6 C) (Oral)    Resp 16    SpO2 99%  Physical Exam Vitals and nursing note reviewed.  Constitutional:      Appearance: She is not ill-appearing.     Comments:  Disheveled appearance  HENT:     Head: Normocephalic and atraumatic.  Eyes:     Conjunctiva/sclera: Conjunctivae normal.  Cardiovascular:     Rate and Rhythm: Normal rate and regular rhythm.  Pulmonary:     Effort: Pulmonary effort is normal.     Breath sounds: Normal breath sounds.  Abdominal:     Palpations: Abdomen is soft.  Musculoskeletal:     Cervical back: Neck supple.  Skin:    General: Skin is warm and dry.  Neurological:     Mental Status: She is alert.  Psychiatric:        Attention and Perception: She is inattentive.        Mood and Affect: Affect is inappropriate.        Behavior: Behavior is slowed.    ED Results / Procedures / Treatments   Labs (all labs ordered are listed, but only abnormal results are displayed) Labs Reviewed  COMPREHENSIVE METABOLIC PANEL - Abnormal; Notable for the following components:      Result Value   Potassium 3.4 (*)    All other components within normal limits  URINALYSIS, ROUTINE W REFLEX MICROSCOPIC - Abnormal; Notable for the following components:   APPearance HAZY (*)    Hgb urine dipstick LARGE (*)    All other components within normal limits  URINALYSIS, MICROSCOPIC (REFLEX) - Abnormal;  Notable for the following components:   Bacteria, UA MANY (*)    All other components within normal limits  RESP PANEL BY RT-PCR (FLU A&B, COVID) ARPGX2  ETHANOL  RAPID URINE DRUG SCREEN, HOSP PERFORMED  CBC WITH DIFFERENTIAL/PLATELET  I-STAT BETA HCG BLOOD, ED (MC, WL, AP ONLY)    EKG None  Radiology CT Head Wo Contrast  Result Date: 10/26/2021 CLINICAL DATA:  Mental status changes EXAM: CT HEAD WITHOUT CONTRAST TECHNIQUE: Contiguous axial images were obtained from the base of the skull through the vertex without intravenous contrast. RADIATION DOSE REDUCTION: This exam was performed according to the departmental dose-optimization program which includes automated exposure control, adjustment of the mA and/or kV according to patient  size and/or use of iterative reconstruction technique. COMPARISON:  None. FINDINGS: Brain: No evidence of acute infarction, hemorrhage, hydrocephalus, extra-axial collection or mass lesion/mass effect. Vascular: No hyperdense vessel or unexpected calcification. Skull: Normal. Negative for fracture or focal lesion. Sinuses/Orbits: No acute finding. Other: None. IMPRESSION: Negative head CT. Electronically Signed   By: Malachy Moan [name]   On: 10/26/2021 16:34    Procedures Procedures    Medications Ordered in ED Medications - No data to display  ED Course/ Medical Decision Making/ A&P Clinical Course as of 10/26/21 2305  Wed Oct 26, 2021  1638 Pt is medically cleared at this time. Pending TTS eval.  [MV]    Clinical Course User Index [MV] Tanda Rockers, PA-C                           Medical Decision Making 36 year old female who presents to the ED today with chief complaint of hallucinations.  History significantly limited as patient appears to be responding to internal stimuli.  She has known history of schizophrenia.  Was hospitalized in June of this past year for same.  On arrival to the ED vitals were stable.  Patient's work-up was started for medical screening purposes with plans for TTS eval.  CBC, CMP, EtOH, UDS have all returned without any acute findings.  Patient is unable to cooperate during exam.  She is inattentive with an appropriate affect.  Given same we will plan for CT head for further evaluation however patient will ultimately likely need TTS eval.  I suspect they they will recommend inpatient psychiatric hospitalization given state of patient currently.   Amount and/or Complexity of Data Reviewed Labs: ordered. Radiology: ordered.    Details: CT Head negative   Pending TTS eval at this time. Awaiting reccs. Home meds reconciled; pt not currently taking anything which is likely contributing to her mental state at this time.         Final Clinical  Impression(s) / ED Diagnoses Final diagnoses:  Hallucinations    Rx / DC Orders ED Discharge Orders     None         Tanda Rockers, PA-C 10/26/21 2305    Benjiman Core, MD 10/27/21 [phone]

## 2021-10-26 NOTE — BH Assessment (Signed)
Comprehensive Clinical Assessment (CCA) Note  10/27/2021 DACY FIGURES [id number]  Disposition: Lindon Romp, NP, patient meets inpatient criteria. No AC to review for [facility]. Disposition SW will seek placement in the AM.  The patient demonstrates the following risk factors for suicide: Chronic risk factors for suicide include: psychiatric disorder of schizophrenia . Acute risk factors for suicide include: N/A. Protective factors for this patient include: responsibility to others (children, family). Considering these factors, the overall suicide risk at this point appears to be high. Patient is not appropriate for outpatient follow up.  Flowsheet Row ED from 10/26/2021 in [place] Admission (Discharged) from 04/02/2021 in [place] 300B  C-SSRS RISK CATEGORY No Risk Low Risk      Emily [name] is a 36 year old female presenting voluntary to [facility] due to hallucinations. Patient has history of anxiety, depression, substance abuse and schizophrenia. Per triage note patient requesting for help and stated that she had been seeing things that just are not good. When asked to share additional information regarding hallucinations, patient stated "different things". Patient seemed confused at times. Patient speech was mumbled and word salad. Patient was unable to finish sentences. TTS clinician unable to understand patient at times. Patient stated "mind blocked, mind taken from me, its not there". Per medical record patient was at [facility] in 03/2021 due to self-harming behaviors of cutting her arm. Patient reported 4 hours of nightly sleep and normal appetite. Patient denied receiving outpatient mental health services and denied being prescribed psych medications. Patient denied alcohol/drug usage. Patient was limited with responses due to current mental status.   Chief Complaint:  Chief Complaint  Patient presents with   Hallucinations    Visit Diagnosis:  Schizophrenia   CCA Screening, Triage and Referral (STR)  Patient Reported Information How did you hear about [place]? Self  What Is the Reason for Your Visit/Call Today? Hallucinations  How Long Has This Been Causing You Problems? -- Pincus Badder)  What Do You Feel Would Help You the Most Today? Treatment for Depression or other mood problem   Have You Recently Had Any Thoughts About Hurting Yourself? No  Are You Planning to Commit Suicide/Harm Yourself At This time? No   Have you Recently Had Thoughts About [place]? No  Are You Planning to Harm Someone at This Time? No  Explanation: No data recorded  Have You Used Any Alcohol or Drugs in the Past 24 Hours? No  How Long Ago Did You Use Drugs or Alcohol? No data recorded What Did You Use and How Much? No data recorded  Do You Currently Have a Therapist/Psychiatrist? No  Name of Therapist/Psychiatrist: No data recorded  Have You Been Recently Discharged From Any Office Practice or Programs? No  Explanation of Discharge From Practice/Program: No data recorded    CCA Screening Triage Referral Assessment Type of Contact: Tele-Assessment  Telemedicine Service Delivery:   Is this Initial or Reassessment? Initial Assessment  Date Telepsych consult ordered in [facility]:  10/26/21  Time Telepsych consult ordered in [facility]:  1602  Location of Assessment: [facility] ED  Provider Location: [facility] Assessment Services   Collateral Involvement: none reported   Does Patient Have a Marshall? No data recorded Name and Contact of Legal Guardian: No data recorded If Minor and Not Living with Parent(s), Who has Custody? No data recorded Is CPS involved or ever been involved? Never  Is APS involved or ever been involved? Never  Patient Determined To Be At Risk for Harm To Self or Others Based on Review of Patient Reported Information or Presenting Complaint? No data recorded Method: No data  recorded Availability of Means: No data recorded Intent: No data recorded Notification Required: No data recorded Additional Information for Danger to Others Potential: No data recorded Additional Comments for Danger to Others Potential: No data recorded Are There Guns or Other Weapons in Your Home? No data recorded Types of Guns/Weapons: No data recorded Are These Weapons Safely Secured?                            No data recorded Who Could Verify You Are Able To Have These Secured: No data recorded Do You Have any Outstanding Charges, Pending Court Dates, Parole/Probation? No data recorded Contacted To Inform of Risk of Harm To Self or Others: No data recorded   Does Patient Present under Involuntary Commitment? No  IVC Papers Initial File Date: No data recorded  [place] of Residence: [place]   Patient Currently Receiving the Following Services: Not Receiving Services   Determination of Need: Emergent (2 hours)   Options For Referral: Inpatient Hospitalization; Medication Management; Outpatient Therapy     CCA Biopsychosocial Patient Reported Schizophrenia/Schizoaffective Diagnosis in Past: No data recorded  Strengths: uta   Mental Health Symptoms Depression:   -- (uta)   Duration of Depressive symptoms:    Mania:   None   Anxiety:    Tension; Restlessness   Psychosis:   Hallucinations   Duration of Psychotic symptoms:  Duration of Psychotic Symptoms: -- Pincus Badder)   Trauma:   -- Pincus Badder)   Obsessions:   -- Pincus Badder)   Compulsions:   -- Pincus Badder)   Inattention:   -- Pincus Badder)   Hyperactivity/Impulsivity:   -- Pincus Badder)   Oppositional/Defiant Behaviors:   -- Pincus Badder)   Emotional Irregularity:   -- Pincus Badder)   Other Mood/Personality Symptoms:  No data recorded   Mental Status Exam Appearance and self-care  Stature:   Average   Weight:   Average weight   Clothing:  No data recorded  Grooming:   Normal   Cosmetic use:   None   Posture/gait:   Normal    Motor activity:   Not Remarkable   Sensorium  Attention:   Confused   Concentration:   Scattered   Orientation:   Situation; Place; Time   Recall/memory:   Defective in Immediate; Defective in Recent   Affect and Mood  Affect:   Anxious   Mood:  No data recorded  Relating  Eye contact:   Normal   Facial expression:   Anxious; Responsive; Tense; Fearful   Attitude toward examiner:   Resistant   Thought and Language  Speech flow:  Slow; Slurred; Soft   Thought content:   Delusions   Preoccupation:   None   Hallucinations:   Auditory; Visual   Organization:  No data recorded  [employer] of Knowledge:   -- Pincus Badder)   Intelligence:   -- Pincus Badder)   Abstraction:   -- Pincus Badder)   Judgement:   -- Pincus Badder)   Reality Testing:   -- Pincus Badder)   Insight:   -- Pincus Badder)   Decision Making:   -- Pincus Badder)   Social Functioning  Social Maturity:   -- Pincus Badder)   Social Judgement:   -- Pincus Badder)   Stress  Stressors:   -- Special educational needs teacher)   Coping Ability:   Overwhelmed  Skill Deficits:   Self-control; Decision making; Communication   Supports:   Support needed     Religion: Religion/Spirituality Are You A Religious Person?:  Special educational needs teacher)  Leisure/Recreation: Leisure / Recreation Do You Have Hobbies?:  Pincus Badder)  Exercise/Diet: Exercise/Diet Do You Exercise?:  (uta) Have You Gained or Lost A Significant Amount of Weight in the Past Six Months?:  (uta) Do You Follow a Special Diet?:  (uta) Do You Have Any Trouble Sleeping?: Yes Explanation of Sleeping Difficulties: 4   CCA Employment/Education Employment/Work Situation: Employment / Work Situation Employment Situation: Unemployed (Pt is currently applying for disabilty) Has Patient ever Been in the [employer]?: No  Education: Education Is Patient Currently Attending School?: No Last Grade Completed:  Pincus Badder) Did You Attend College?:  (uta) Did You Have An Individualized Education Program (IIEP):  Pincus Badder) Did You  Have Any Difficulty At School?:  Pincus Badder) Patient's Education Has Been Impacted by Current Illness:  (uta)   CCA Family/Childhood History Family and Relationship History: Family history Does patient have children?: Yes How many children?: 2 How is patient's relationship with their children?: poor  Childhood History:  Childhood History By whom was/is the patient raised?: Mother Did patient suffer any verbal/emotional/physical/sexual abuse as a child?: No Has patient ever been sexually abused/assaulted/raped as an adolescent or adult?: No Witnessed domestic violence?: No Has patient been affected by domestic violence as an adult?: No  Child/Adolescent Assessment:     CCA Substance Use Alcohol/Drug Use:                           ASAM's:  Six Dimensions of Multidimensional Assessment  Dimension 1:  Acute Intoxication and/or Withdrawal Potential:      Dimension 2:  Biomedical Conditions and Complications:      Dimension 3:  Emotional, Behavioral, or Cognitive Conditions and Complications:     Dimension 4:  Readiness to Change:     Dimension 5:  Relapse, Continued use, or Continued Problem Potential:     Dimension 6:  Recovery/Living Environment:     ASAM Severity Score:    ASAM Recommended Level of Treatment:     Substance use Disorder (SUD)    Recommendations for Services/Supports/Treatments:    Discharge Disposition:    DSM5 Diagnoses: Patient Active Problem List   Diagnosis Date Noted   Altered mental state 04/08/2021   Substance abuse ([place]) 04/06/2021   Depression 04/02/2021   Hepatitis C antibody positive in blood 06/29/2016   Tobacco use disorder 06/28/2016   Amphetamine use disorder, severe ([place]) 06/28/2016   Cannabis use disorder, severe, dependence ([place]) 06/28/2016     Referrals to Alternative Service(s): Referred to Alternative Service(s):   Place:   Date:   Time:    Referred to Alternative Service(s):   Place:   Date:   Time:    Referred  to Alternative Service(s):   Place:   Date:   Time:    Referred to Alternative Service(s):   Place:   Date:   Time:     Venora Maples, [facility]

## 2021-10-26 NOTE — ED Notes (Signed)
Pt wanded. Belongings at nursing station.

## 2021-10-26 NOTE — ED Triage Notes (Signed)
Patient here requesting "help". Patient states " I have been seeing somethings that just aren't good". Patient does not elaborate about what she is seeing. Patient calm and cooperative at this time, denies SI and HI.

## 2021-10-26 NOTE — ED Notes (Signed)
Pt in 47 for TTS

## 2021-10-26 NOTE — ED Provider Triage Note (Signed)
Emergency Medicine Provider Triage Evaluation Note  Emily [name] , a 36 y.o. female  was evaluated in triage.  Pt with history of substance abuse, depression, anxiety, suicidal ideations.  She presents stating that she needs help getting back on some of her medications.  She is sitting in exam room on chux.  She gives very vague history and is unable to give details about her current situation.    Review of Systems  Positive:  Negative: Pain, fever  Physical Exam  There were no vitals taken for this visit. Gen:   Awake, no distress   Resp:  Normal effort  MSK:   Moves extremities without difficulty  Other:  Bizarre affect, seems confused, speech is somewhat fragmented  Medical Decision Making  Medically screening exam initiated at 2:15 PM.  Appropriate orders placed.  [name] was informed that the remainder of the evaluation will be completed by another provider, this initial triage assessment does not replace that evaluation, and the importance of remaining in the ED until their evaluation is complete.     Renne Crigler, PA-C 10/26/21 1416

## 2021-10-27 MED ORDER — ACETAMINOPHEN 325 MG PO TABS
650.0000 mg | ORAL_TABLET | Freq: Four times a day (QID) | ORAL | Status: DC | PRN
  Administered 2021-10-27: 650 mg via ORAL
  Filled 2021-10-27: qty 2

## 2021-10-27 MED ORDER — LORAZEPAM 1 MG PO TABS
1.0000 mg | ORAL_TABLET | Freq: Once | ORAL | Status: AC
  Administered 2021-10-27: 1 mg via ORAL
  Filled 2021-10-27: qty 1

## 2021-10-27 NOTE — ED Provider Notes (Signed)
Emergency Medicine Observation Re-evaluation Note  Emily [name] is a 36 y.o. female, seen on rounds today.  Pt initially presented to the ED for complaints of Hallucinations Currently, the patient is sleeping.  Physical Exam  BP 101/68    Pulse 63    Temp 98 F (36.7 C) (Oral)    Resp 16    SpO2 99%  Physical Exam General: No acute distress Cardiac: Well perfused Lungs: Nonlabored Psych: Cooperative  ED Course / MDM  EKG:   I have reviewed the labs performed to date as well as medications administered while in observation.  Recent changes in the last 24 hours include TTS evaluation.  Plan  Current plan is for inpatient psych placement.  Emily [name] is not under involuntary commitment.     Emily Files, MD 10/27/21 [phone]

## 2021-10-27 NOTE — Progress Notes (Signed)
[facility]/BM[facility] LCSW Progress Note   10/27/2021    1:04 PM  Emily [name]   [id number]   Type of Contact and Topic:  Psychiatric Bed Placement   Pt accepted to [facility]     Patient meets inpatient criteria per Nira Conn, FNP    The attending provider will be Estill Cotta, MD   Call report to [phone] or [phone]  Ignacia Felling, RN @ [facility] ED notified.     Pt scheduled  to arrive at [facility] TODAY ANYTIME.    Damita Dunnings, MSW, LCSW-A  1:05 PM 10/27/2021

## 2021-10-27 NOTE — ED Notes (Signed)
Pt provided decaf coffee per pt's request.

## 2021-10-27 NOTE — Progress Notes (Signed)
Patient has been denied by [facility] and [facility] due to no beds available. Patient meets BH inpatient criteria per Nira Conn, FNP. Patient has been faxed out to the following facilities:    [facility]  [phone]  [facility]  [phone]  [facility]  [phone]  [facility]  [phone]  [facility]  [phone]  [facility]  [phone]  [facility]  [phone]  [facility]  [phone]  [facility]  [phone]  [facility]  [phone]  [facility]  [phone]  [facility]  [phone]  [facility]  [phone]  [facility]  [phone]  [facility]  [phone]  [facility]  [phone]  [facility]  [phone]   Damita Dunnings, MSW, LCSW-A  10:42 AM 10/27/2021

## 2021-10-27 NOTE — ED Notes (Signed)
Pt belongings Emily [name] #2

## 2021-10-27 NOTE — ED Notes (Signed)
Attempted to call both [facility] numbers provided to give report, no answer/voicemail.

## 2021-10-27 NOTE — ED Notes (Addendum)
This RN communicated with CSW and pt has a bed available at [facility] into the morning. This RN attempted to call safe transport, however, no answer.

## 2021-10-27 NOTE — Progress Notes (Signed)
Inpatient Behavioral Health Placement  Pt meets inpatient criteria per Nira Conn, NP. CSW sent a secure chat to day shift [facility] requesting to review. Referral was sent to the following facilities;   Destination Service Provider Address Phone Fax  [facility]  [address], [place] [address] [phone]  [facility] [address]  [address], [place] [address] [phone]  [facility]  [address] [place], [place] [address] [phone]  [facility]  [address] [place] [address] [phone]  [facility]  [address] [place] [address] [phone]  [facility]  [address] [place] [address] [phone]  [facility]  [address] [place] [address] [phone]  [facility]  [address], [place] [address] [phone]  [facility]  [address], [place] [address] [phone]  [facility]  [address], [place] [address] [phone]  [facility]  [address], [place] [address] [phone]  [facility]  [address] [place] [address] [phone]  [facility]  [address], [place] [address] [phone]  [facility]  [address] [place] [address] [phone]  [facility]  [address] [place] [address] [phone]  [facility]  [address], [place] [address] [phone]  [facility]  [address], [place] [address]  [phone]    Situation ongoing,  CSW will follow up.   Maryjean Ka, MSW, LCSWA 10/27/2021  @ 12:43 AM

## 2021-10-27 NOTE — Progress Notes (Signed)
Pt Accepted at [facility] 10/27/2021  CSW received a phone call from [place] with [facility] nquiring about pt's arrival. CSW communicated with pt's RN   Griselda Miner who advised that pt is willing to go [facility]. CSW inquired about RN contacting [facility] because [facility] is willing to accept pt tonight if transport is available. Ron advised that bed can be help until the AM to assist with transport need. CSW to follow.    Benjaman Kindler, MSW, LCSWA 10/27/2021 11:15 PM

## 2021-10-28 NOTE — ED Notes (Signed)
Pt verbalized agreement to placement at [facility]. This RN walked with patient to safe transport. This RN placed pt's belongings and transfer paperwork in front seat of safe transport.

## 2021-10-28 NOTE — Progress Notes (Signed)
Virginia Crews, RN confirmed that reported was given to [facility] and [facility] in the progress of pick up pt up.  Maryjean Ka, MSW, LCSWA 10/28/2021 12:06 AM

## 2022-02-09 ENCOUNTER — Emergency Department (HOSPITAL_COMMUNITY)
Admission: EM | Admit: 2022-02-09 | Discharge: 2022-02-15 | Disposition: A | Discharge status: Home / Self Care | Payer: Self-pay | Attending: Student | Admitting: Student

## 2022-02-09 ENCOUNTER — Emergency Department (HOSPITAL_COMMUNITY): Payer: Self-pay

## 2022-02-09 ENCOUNTER — Other Ambulatory Visit: Payer: Self-pay

## 2022-02-09 DIAGNOSIS — R4182 Altered mental status, unspecified: Secondary | ICD-10-CM | POA: Insufficient documentation

## 2022-02-09 DIAGNOSIS — R000 Tachycardia, unspecified: Secondary | ICD-10-CM | POA: Insufficient documentation

## 2022-02-09 DIAGNOSIS — F419 Anxiety disorder, unspecified: Secondary | ICD-10-CM | POA: Insufficient documentation

## 2022-02-09 DIAGNOSIS — F32A Depression, unspecified: Secondary | ICD-10-CM | POA: Diagnosis present

## 2022-02-09 DIAGNOSIS — F1520 Other stimulant dependence, uncomplicated: Secondary | ICD-10-CM | POA: Diagnosis present

## 2022-02-09 DIAGNOSIS — Z20822 Contact with and (suspected) exposure to covid-19: Secondary | ICD-10-CM | POA: Insufficient documentation

## 2022-02-09 DIAGNOSIS — Z7722 Contact with and (suspected) exposure to environmental tobacco smoke (acute) (chronic): Secondary | ICD-10-CM | POA: Insufficient documentation

## 2022-02-09 DIAGNOSIS — F209 Schizophrenia, unspecified: Secondary | ICD-10-CM | POA: Insufficient documentation

## 2022-02-09 DIAGNOSIS — R443 Hallucinations, unspecified: Secondary | ICD-10-CM

## 2022-02-09 DIAGNOSIS — F1594 Other stimulant use, unspecified with stimulant-induced mood disorder: Secondary | ICD-10-CM

## 2022-02-09 DIAGNOSIS — F329 Major depressive disorder, single episode, unspecified: Secondary | ICD-10-CM | POA: Insufficient documentation

## 2022-02-09 DIAGNOSIS — F29 Unspecified psychosis not due to a substance or known physiological condition: Secondary | ICD-10-CM | POA: Insufficient documentation

## 2022-02-09 LAB — COMPREHENSIVE METABOLIC PANEL
ALT: 50 U/L — ABNORMAL HIGH (ref 0–44)
AST: 35 U/L (ref 15–41)
Albumin: 4.5 g/dL (ref 3.5–5.0)
Alkaline Phosphatase: 44 U/L (ref 38–126)
Anion gap: 9 (ref 5–15)
BUN: 9 mg/dL (ref 6–20)
CO2: 22 mmol/L (ref 22–32)
Calcium: 9.5 mg/dL (ref 8.9–10.3)
Chloride: 106 mmol/L (ref 98–111)
Creatinine, Ser: 0.55 mg/dL (ref 0.44–1.00)
GFR, Estimated: >60 mL/min (ref >60)
Glucose, Bld: 91 mg/dL (ref 70–99)
Potassium: 4.3 mmol/L (ref 3.5–5.1)
Sodium: 137 mmol/L (ref 135–145)
Total Bilirubin: 0.6 mg/dL (ref 0.3–1.2)
Total Protein: 7.7 g/dL (ref 6.5–8.1)

## 2022-02-09 LAB — CBC WITH DIFFERENTIAL/PLATELET
Abs Immature Granulocytes: 0.02 10*3/uL (ref 0.00–0.07)
Basophils Absolute: 0 10*3/uL (ref 0.0–0.1)
Basophils Relative: 0 %
Eosinophils Absolute: 0 10*3/uL (ref 0.0–0.5)
Eosinophils Relative: 0 %
HCT: 42.3 % (ref 36.0–46.0)
Hemoglobin: 13.9 g/dL (ref 12.0–15.0)
Immature Granulocytes: 0 %
Lymphocytes Relative: 29 %
Lymphs Abs: 2.1 10*3/uL (ref 0.7–4.0)
MCH: 30.8 pg (ref 26.0–34.0)
MCHC: 32.9 g/dL (ref 30.0–36.0)
MCV: 93.6 fL (ref 80.0–100.0)
Monocytes Absolute: 0.7 10*3/uL (ref 0.1–1.0)
Monocytes Relative: 9 %
Neutro Abs: 4.4 10*3/uL (ref 1.7–7.7)
Neutrophils Relative %: 62 %
Platelets: 332 10*3/uL (ref 150–400)
RBC: 4.52 MIL/uL (ref 3.87–5.11)
RDW: 13.1 % (ref 11.5–15.5)
WBC: 7.2 10*3/uL (ref 4.0–10.5)
nRBC: 0 % (ref 0.0–0.2)

## 2022-02-09 LAB — CBG MONITORING, ED: Glucose-Capillary: 87 mg/dL (ref 70–99)

## 2022-02-09 LAB — ETHANOL: Alcohol, Ethyl (B): <10 mg/dL (ref <10)

## 2022-02-09 LAB — ACETAMINOPHEN LEVEL: Acetaminophen (Tylenol), Serum: <10 ug/mL — ABNORMAL LOW (ref 10–30)

## 2022-02-09 LAB — SALICYLATE LEVEL: Salicylate Lvl: <7 mg/dL — ABNORMAL LOW (ref 7.0–30.0)

## 2022-02-09 NOTE — ED Notes (Signed)
MD assessing patient at this time.

## 2022-02-09 NOTE — ED Notes (Signed)
This nurse asked patient if she uses any illegal drugs. Patient states that she sometimes uses "meth or something like that."

## 2022-02-09 NOTE — BH Assessment (Signed)
Attempted to assess patient, but she is not able to fully answer quests, possible thought blocking, speech is very slurred and she is hard to understand.  Patient is possibly intoxicated.  CT scan negative.  One side of her mouth is drawn.  Will try to assess later today.

## 2022-02-09 NOTE — ED Notes (Signed)
Patient transported to CT 

## 2022-02-09 NOTE — ED Provider Notes (Signed)
[facility] EMERGENCY DEPARTMENT Provider Note  CSN: [id number] Arrival date & time: 02/09/22 1145  Chief Complaint(s) Altered Mental Status  HPI Emily [name] is a 36 y.o. female with PMH psychosis, polysubstance abuse, Anxiety, depression who presents to the emergency department by EMS for altered mental status and aggressive behavior.  Patient apparently showed up to patient's sister's home and locked herself in the sister's garage.  On arrival, patient is quite somnolent and is unable to answer questions or give further history.  There is concern that the patient may be off of her medication or have used illicit substances.   Past Medical History Past Medical History:  Diagnosis Date   Anxiety    Anxiety    Depression    Patient Active Problem List   Diagnosis Date Noted   Altered mental state 04/08/2021   Substance abuse (HCC) 04/06/2021   Depression 04/02/2021   Hepatitis C antibody positive in blood 06/29/2016   Tobacco use disorder 06/28/2016   Amphetamine use disorder, severe (HCC) 06/28/2016   Cannabis use disorder, severe, dependence (HCC) 06/28/2016   Home Medication(s) Prior to Admission medications   Not on File                                                                                                                                    Past Surgical History No past surgical history on file. Family History No family history on file.  Social History Social History   Tobacco Use   Smoking status: Every Day    Passive exposure: Past   Smokeless tobacco: Never  Substance Use Topics   Alcohol use: Yes    Comment: 2-3 drinks but none recently per pt report   Drug use: No   Allergies Bee venom  Review of Systems Review of Systems  Unable to perform ROS: Psychiatric disorder   Physical Exam Vital Signs  I have reviewed the triage vital signs BP 111/79 (BP Location: Left Arm)   Pulse 92   Temp 99.1 F (37.3 C) (Oral)   Resp 18   Ht 5\' 4"   (1.626 m)   Wt 53.3 kg   SpO2 100%   BMI 20.19 kg/m   Physical Exam Vitals and nursing note reviewed.  Constitutional:      General: She is not in acute distress.    Appearance: She is well-developed.  HENT:     Head: Normocephalic and atraumatic.  Eyes:     Conjunctiva/sclera: Conjunctivae normal.  Cardiovascular:     Rate and Rhythm: Normal rate and regular rhythm.     Heart sounds: No murmur heard. Pulmonary:     Effort: Pulmonary effort is normal. No respiratory distress.     Breath sounds: Normal breath sounds.  Abdominal:     Palpations: Abdomen is soft.     Tenderness: There is no abdominal tenderness.  Musculoskeletal:        General: No  swelling.     Cervical back: Neck supple.  Skin:    General: Skin is warm and dry.     Capillary Refill: Capillary refill takes less than 2 seconds.  Neurological:     Mental Status: She is alert. She is disoriented.  Psychiatric:        Mood and Affect: Mood normal.    ED Results and Treatments Labs (all labs ordered are listed, but only abnormal results are displayed) Labs Reviewed  COMPREHENSIVE METABOLIC PANEL - Abnormal; Notable for the following components:      Result Value   ALT 50 (*)    All other components within normal limits  SALICYLATE LEVEL - Abnormal; Notable for the following components:   Salicylate Lvl <7.0 (*)    All other components within normal limits  ACETAMINOPHEN LEVEL - Abnormal; Notable for the following components:   Acetaminophen (Tylenol), Serum <10 (*)    All other components within normal limits  ETHANOL  CBC WITH DIFFERENTIAL/PLATELET  RAPID URINE DRUG SCREEN, HOSP PERFORMED  CBG MONITORING, ED  POC URINE PREG, ED                                                                                                                          Radiology CT Head Wo Contrast  Result Date: 02/09/2022 CLINICAL DATA:  Delirium EXAM: CT HEAD WITHOUT CONTRAST TECHNIQUE: Contiguous axial images were  obtained from the base of the skull through the vertex without intravenous contrast. RADIATION DOSE REDUCTION: This exam was performed according to the departmental dose-optimization program which includes automated exposure control, adjustment of the mA and/or kV according to patient size and/or use of iterative reconstruction technique. COMPARISON:  CT head 10/26/2021 FINDINGS: Brain: No acute intracranial hemorrhage, mass effect, or herniation. No extra-axial fluid collections. No evidence of acute territorial infarct. No hydrocephalus. Vascular: No hyperdense vessel or unexpected calcification. Skull: Normal. Negative for fracture or focal lesion. Sinuses/Orbits: No acute finding. Other: None. IMPRESSION: No acute intracranial process identified. Electronically Signed   By: Jannifer Hick [name]   On: 02/09/2022 13:08    Pertinent labs & imaging results that were available during my care of the patient were reviewed by me and considered in my medical decision making (see MDM for details).  Medications Ordered in ED Medications - No data to display  Procedures Procedures  (including critical care time)  Medical Decision Making / ED Course   This patient presents to the ED for concern of altered mental status, psychosis, this involves an extensive number of treatment options, and is a complaint that carries with it a high risk of complications and morbidity.  The differential diagnosis includes polysubstance use, polypharmacy, psychosis, bipolar disorder  MDM: Patient seen emergency room for evaluation of altered mental status and psychosis.  Physical exam reveals a disheveled appearing patient that is somnolent and intermittently answering questions but will follow commands.  Laboratory evaluation is largely unremarkable but UDS is currently pending.  CT head is  unremarkable.  TTS has been consulted due to concern for underlying psychosis and their recommendations are pending.  At this point the patient is medically clear.  Patient then signed out to oncoming provider.  Please see provider signout for continuation of work-up.   Additional history obtained: -Additional history obtained from EMS -External records from outside source obtained and reviewed including: Chart review including previous notes, labs, imaging, consultation notes   Lab Tests: -I ordered, reviewed, and interpreted labs.   The pertinent results include:   Labs Reviewed  COMPREHENSIVE METABOLIC PANEL - Abnormal; Notable for the following components:      Result Value   ALT 50 (*)    All other components within normal limits  SALICYLATE LEVEL - Abnormal; Notable for the following components:   Salicylate Lvl <7.0 (*)    All other components within normal limits  ACETAMINOPHEN LEVEL - Abnormal; Notable for the following components:   Acetaminophen (Tylenol), Serum <10 (*)    All other components within normal limits  ETHANOL  CBC WITH DIFFERENTIAL/PLATELET  RAPID URINE DRUG SCREEN, HOSP PERFORMED  CBG MONITORING, ED  POC URINE PREG, ED      EKG   EKG Interpretation  Date/Time:  Thursday Feb 09 2022 12:33:48 EDT Ventricular Rate:  104 PR Interval:  136 QRS Duration: 74 QT Interval:  356 QTC Calculation: 468 R Axis:   89 Text Interpretation: Sinus tachycardia Right atrial enlargement Borderline ECG When compared with ECG of 02-Apr-2021 16:20, Vent. rate has increased BY  43 BPM Confirmed by Alyannah Sanks ([id number]) on 02/09/2022 3:34:32 PM         Imaging Studies ordered: I ordered imaging studies including CT head I independently visualized and interpreted imaging. I agree with the radiologist interpretation   Medicines ordered and prescription drug management: No orders of the defined types were placed in this encounter.   -I have reviewed the patients  home medicines and have made adjustments as needed  Critical interventions none  Consultations Obtained: I requested consultation with the TTS,  and discussed lab and imaging findings as well as pertinent plan - they recommend: Recs pending  Social Determinants of Health:  Factors impacting patients care include: History of polysubstance abuse, psychosis   Reevaluation: After the interventions noted above, I reevaluated the patient and found that they have :improved  Co morbidities that complicate the patient evaluation  Past Medical History:  Diagnosis Date   Anxiety    Anxiety    Depression       Dispostion: I considered admission for this patient, and recommendations are pending from TTS who will determine patient's disposition     Final Clinical Impression(s) / ED Diagnoses Final diagnoses:  None     @PCDICTATION @    Glendora ScoreKommor, Kia Varnadore, MD 02/09/22 1535

## 2022-02-09 NOTE — ED Notes (Signed)
Patient ambulated to restroom without assistance. Patient flushed toilet before getting urine sample. EDP made aware of neuro assessment.

## 2022-02-09 NOTE — ED Triage Notes (Signed)
[facility] called out for psychiatric problem, pt showed up at sister's house, was told to leave, pt ran into garage, locked herself in, EMS and Sheriff's [employer] on scene. Will follow commands for EMS, was able to stand and sit on stretcher, will get aggressive with staff. History drug use, schizophrenia, SI/HI.

## 2022-02-10 LAB — RAPID URINE DRUG SCREEN, HOSP PERFORMED
Amphetamines: POSITIVE — AB
Barbiturates: NOT DETECTED
Benzodiazepines: NOT DETECTED
Cocaine: NOT DETECTED
Opiates: NOT DETECTED
Tetrahydrocannabinol: NOT DETECTED

## 2022-02-10 LAB — POC URINE PREG, ED: Preg Test, Ur: NEGATIVE

## 2022-02-10 NOTE — ED Notes (Addendum)
Delrae Sawyers counselor notified nurse during her conversation with her NP she recommend observing the pt overnight and re-assessing tomorrow. NP informed pt.cannot be psych cleared without a UDS which may indicate where her confusion/AMS is coming from.  NP suspect it is drug (meth-induced) but cannot be sure without a UDS. Will notify EDP.

## 2022-02-10 NOTE — ED Notes (Signed)
Patient dressed in hospital attire and wanded by security

## 2022-02-10 NOTE — BH Assessment (Signed)
Comprehensive Clinical Assessment (CCA) Note  02/10/2022 Emily [name] [id number]  DISPOSITION: Per Emily ColonelAggie Nwoko NP, pt is recommended for overnight observation and re-assessment tomorrow after an UDS is collected and processed. Suspect drug (meth-induced) AMS.   The patient demonstrates the following risk factors for suicide: Chronic risk factors for suicide include: psychiatric disorder of psychosis and substance use disorder. Acute risk factors for suicide include: family or marital conflict and social withdrawal/isolation. Protective factors for this patient include: hope for the future. Considering these factors, the overall suicide risk at this point appears to be low. Patient is appropriate for outpatient follow up.  Flowsheet Row [facility] ([facility]  C-SSRS RISK CATEGORY Low Risk No Risk Low Risk      Pt is a 36 yo female who presented voluntarily via law enforcement after going to her sister's home, being asked to leave and locking herself in her sister's garage. Pt was then aggressive with LE and with ED staff reportedly. Hx of schizophrenia per chart. Hx of meth use along with a hx of other drug use. Pt reported use of meth 4 of 7 days a week but denied all other drug and alcohol use currently. Pt reported passive SI with only thoughts of wishing to disappear without a plan or true intent. Pt denied HI, AVH, paranoia or current NSSH, Pt reported a past hx of superficial cutting but stated she had not recent episodes of cutting. Pt was calm, cooperative, alert but did not appear fully oriented with limited ability or willingness to answer verbally. Pt most often answered by nodding her head. Pt's insight and judgment seemed impaired. Pt's mood seemed euthymic and her bright affect seemed congruent. Pt's  limited speech was a bit garbled and her movement seemed within normal limits. It is unclear whether pt was responding to internal stimuli, but she did not appear to be and denied AVH. Pt did not express any delusional thoughts but she appeared to be thought blocking at times. Pt denied any recent use of meth or any other drug or alcohol.  Pt could not or would not answer as to where she lived or with whom, how far she went in school, is she was married or had children. Pt could not or would not answer if she has a therapist or is prescribed any psychiatric medications and if so, by whom.    Chief Complaint:  Chief Complaint  Patient presents with   Altered Mental Status   Visit Diagnosis:  Schizophrenia    CCA Screening, Triage and Referral (STR)  Patient Reported Information How did you hear about us? Legal System  What Is the Reason for Your Visit/Call Today? Pt is a 36 yo female who presented voluntarily via law enforcement after going to her sister's home, being asked to leave and locking herself in her sister's garage. Pt was then aggressive with LE and with ED staff reportedly. Hx of meth use along with a hx of other drug use. Pt reported use of meth 4 of 7 days a week but denied all other drug and alcohol use currently. Pt reported passive SI with only thoughts of wishing to disappear without a plan or true intent. Pt denied HI, AVH, paranoia or current NSSH, Pt reported a past hx of superficial cutting but stated she had not recent episodes of cutting. Pt was  calm, cooperative, alert but did not appear fully oriented with limited ability or willingness to answer verbally. Pt most often answered by nodding her head. Pt's insight and judgment seemed impaired. Pt's mood seemed euthymic and her bright affect seemed congruent. Pt's limited speech was a bit garbled and her movement seemed within normal limits. It is unclear whether pt was responding to internal stimuli, but she did not appear  to be and denied AVH. Pt did not express any delusional thoughts but she appeared to be thought blocking at times.  How Long Has This Been Causing You Problems? > than 6 months  What Do You Feel Would Help You the Most Today? Treatment for Depression or other mood problem   Have You Recently Had Any Thoughts About Hurting Yourself? Yes  Are You Planning to Commit Suicide/Harm Yourself At This time? No   Have you Recently Had Thoughts About Hurting Someone [name]? No  Are You Planning to Harm Someone at This Time? No  Explanation: No data recorded  Have You Used Any Alcohol or Drugs in the Past 24 Hours? No  How Long Ago Did You Use Drugs or Alcohol? No data recorded What Did You Use and How Much? Pt denied any recent use of meth or any other drug or alcohol.   Do You Currently Have a Therapist/Psychiatrist? -- Rich Reining)  Name of Therapist/Psychiatrist: No data recorded  Have You Been Recently Discharged From Any Office Practice or Programs? No  Explanation of Discharge From Practice/Program: No data recorded    CCA Screening Triage Referral Assessment Type of Contact: Tele-Assessment  Telemedicine Service Delivery:   Is this Initial or Reassessment? Initial Assessment  Date Telepsych consult ordered in [facility]:  02/10/22  Time Telepsych consult ordered in [facility]:  1325  Location of Assessment: [facility]  Provider Location: [facility] Assessment Services   Collateral Involvement: none   Does Patient Have a Automotive engineer Guardian? No data recorded Name and Contact of Legal Guardian: No data recorded If Minor and Not Living with Parent(s), Who has Custody? No data recorded Is CPS involved or ever been involved? -- (uta)  Is APS involved or ever been involved? -- Rich Reining)   Patient Determined To Be At Risk for Harm To Self or Others Based on Review of Patient Reported Information or Presenting Complaint? No data recorded Method: No data recorded Availability of Means: No data  recorded Intent: No data recorded Notification Required: No data recorded Additional Information for Danger to Others Potential: No data recorded Additional Comments for Danger to Others Potential: No data recorded Are There Guns or Other Weapons in Your Home? No data recorded Types of Guns/Weapons: No data recorded Are These Weapons Safely Secured?                            No data recorded Who Could Verify You Are Able To Have These Secured: No data recorded Do You Have any Outstanding Charges, Pending Court Dates, Parole/Probation? No data recorded Contacted To Inform of Risk of Harm To Self or Others: No data recorded   Does Patient Present under Involuntary Commitment? No  IVC Papers Initial File Date: No data recorded  [place] of Residence: [place]   Patient Currently Receiving the Following Services: Not Receiving Services   Determination of Need: Urgent (48 hours) (Per Aggie Nwoko NP, pt is recommended for overnight observation and re-assessment tomorrow after an UDS is collected and processed. Suspect drug (  meth-induced) AMS.)   Options For Referral: Inpatient Hospitalization; Medication Management; Outpatient Therapy     CCA Biopsychosocial Patient Reported Schizophrenia/Schizoaffective Diagnosis in Past: Yes (per chart)   Strengths: uta   Mental Health Symptoms Depression:   -- (uta)   Duration of Depressive symptoms:    Mania:   None   Anxiety:    Tension; Restlessness   Psychosis:   None (denied)   Duration of Psychotic symptoms:    Trauma:   -- Rich Reining)   Obsessions:   -- Rich Reining)   Compulsions:   -- Rich Reining)   Inattention:   -- Rich Reining)   Hyperactivity/Impulsivity:   -- Rich Reining)   Oppositional/Defiant Behaviors:   -- Rich Reining)   Emotional Irregularity:   -- Rich Reining)   Other Mood/Personality Symptoms:   uta    Mental Status Exam Appearance and self-care  Stature:   Average   Weight:   Average weight   Clothing:   Disheveled   Grooming:    Neglected   Cosmetic use:   None   Posture/gait:   Normal   Motor activity:   Not Remarkable   Sensorium  Attention:   Confused   Concentration:   Scattered   Orientation:   Situation; Place; Time; Person   Recall/memory:   Defective in Immediate; Defective in Recent; Defective in Short-term   Affect and Mood  Affect:   Anxious; Full Range   Mood:   Euthymic   Relating  Eye contact:   Staring   Facial expression:   Anxious; Responsive; Tense; Fearful   Attitude toward examiner:   Guarded; Cooperative   Thought and Language  Speech flow:  Slow; Soft; Paucity; Garbled   Thought content:   -- Rich Reining)   Preoccupation:   -- Rich Reining)   Hallucinations:   None (denied)   Organization:  No data recorded  [employer] of Knowledge:   -- Rich Reining)   Intelligence:   -- Rich Reining)   Abstraction:   -- Rich Reining)   Judgement:   -- Rich Reining)   Reality Testing:   -- Rich Reining)   Insight:   -- Rich Reining)   Decision Making:   -- Rich Reining)   Social Functioning  Social Maturity:   -- Rich Reining)   Social Judgement:   -- Rich Reining)   Stress  Stressors:   -- Industrial/product designer)   Coping Ability:   Human resources officer Deficits:   Self-control; Scientist, physiological; Communication   Supports:   Support needed     Religion: Religion/Spirituality Are You A Religious Person?:  Industrial/product designer)  Leisure/Recreation: Leisure / Recreation Do You Have Hobbies?:  Rich Reining)  Exercise/Diet: Exercise/Diet Do You Exercise?:  (uta) Have You Gained or Lost A Significant Amount of Weight in the Past Six Months?:  (uta) Do You Follow a Special Diet?:  (uta) Do You Have Any Trouble Sleeping?: Yes   CCA Employment/Education Employment/Work Situation: Employment / Work Situation Employment Situation: Unemployed (Pt is currently applying for disabilty) Has Patient ever Been in Equities trader?: No  Education: Education Last Grade Completed:  Rich Reining) Did You Attend College?:  (uta) Did You Have An  Individualized Education Program (IIEP):  Rich Reining) Did You Have Any Difficulty At School?:  (uta)   CCA Family/Childhood History Family and Relationship History: Family history Does patient have children?: Yes  Childhood History:  Childhood History By whom was/is the patient raised?: Mother Did patient suffer any verbal/emotional/physical/sexual abuse as a child?: No Has patient ever been sexually abused/assaulted/raped as an adolescent or adult?: No Witnessed  domestic violence?: No Has patient been affected by domestic violence as an adult?: No  Child/Adolescent Assessment:     CCA Substance Use Alcohol/Drug Use: Alcohol / Drug Use Pain Medications: see MAR Prescriptions: see MAR Over the Counter: see MAR History of alcohol / drug use?: Yes (uta) Longest period of sobriety (when/how long): Unknown Negative Consequences of Use:  (None Reported) Withdrawal Symptoms:  (None Reported) Substance #1 Name of Substance 1: methamphetamine 1 - Age of First Use: unknown 1 - Amount (size/oz): unknown 1 - Frequency: 4 of 7 days per pt 1 - Duration: ongoing 1 - Last Use / Amount: 2 days ago 1 - Method of Aquiring: unknown 1- Route of Use: unknown                       ASAM's:  Six Dimensions of Multidimensional Assessment  Dimension 1:  Acute Intoxication and/or Withdrawal Potential:      Dimension 2:  Biomedical Conditions and Complications:      Dimension 3:  Emotional, Behavioral, or Cognitive Conditions and Complications:     Dimension 4:  Readiness to Change:     Dimension 5:  Relapse, Continued use, or Continued Problem Potential:     Dimension 6:  Recovery/Living Environment:     ASAM Severity Score:    ASAM Recommended Level of Treatment:     Substance use Disorder (SUD)    Recommendations for Services/Supports/Treatments:    Discharge Disposition:    DSM5 Diagnoses: Patient Active Problem List   Diagnosis Date Noted   Altered mental state  04/08/2021   Substance abuse (HCC) 04/06/2021   Depression 04/02/2021   Hepatitis C antibody positive in blood 06/29/2016   Tobacco use disorder 06/28/2016   Amphetamine use disorder, severe (HCC) 06/28/2016   Cannabis use disorder, severe, dependence (HCC) 06/28/2016     Referrals to Alternative Service(s): Referred to Alternative Service(s):   Place:   Date:   Time:    Referred to Alternative Service(s):   Place:   Date:   Time:    Referred to Alternative Service(s):   Place:   Date:   Time:    Referred to Alternative Service(s):   Place:   Date:   Time:     Carolanne Grumbling, Counselor  Corrie Dandy T. Jimmye Norman, MS, [facility], [facility] Triage Specialist [facility]

## 2022-02-10 NOTE — ED Notes (Signed)
TTS in progress 

## 2022-02-10 NOTE — ED Notes (Signed)
TTS completed. 

## 2022-02-11 DIAGNOSIS — F1594 Other stimulant use, unspecified with stimulant-induced mood disorder: Secondary | ICD-10-CM

## 2022-02-11 MED ORDER — HYDROXYZINE HCL 25 MG PO TABS
25.0000 mg | ORAL_TABLET | Freq: Three times a day (TID) | ORAL | Status: DC | PRN
  Administered 2022-02-14 – 2022-02-15 (×2): 25 mg via ORAL
  Filled 2022-02-11 (×2): qty 1

## 2022-02-11 MED ORDER — GABAPENTIN 100 MG PO CAPS
100.0000 mg | ORAL_CAPSULE | Freq: Two times a day (BID) | ORAL | Status: DC
  Administered 2022-02-12 – 2022-02-15 (×7): 100 mg via ORAL
  Filled 2022-02-11 (×8): qty 1

## 2022-02-11 MED ORDER — NICOTINE 21 MG/24HR TD PT24
21.0000 mg | MEDICATED_PATCH | Freq: Every day | TRANSDERMAL | Status: DC
  Administered 2022-02-11 – 2022-02-15 (×5): 21 mg via TRANSDERMAL
  Filled 2022-02-11 (×5): qty 1

## 2022-02-11 MED ORDER — OLANZAPINE 5 MG PO TBDP
5.0000 mg | ORAL_TABLET | Freq: Every day | ORAL | Status: DC
  Administered 2022-02-11 – 2022-02-14 (×4): 5 mg via ORAL
  Filled 2022-02-11 (×4): qty 1

## 2022-02-11 NOTE — Consult Note (Signed)
Attempted to see pt for psychiatry reassessment via TTS cart.  Cart being used. Will try again later.

## 2022-02-11 NOTE — Consult Note (Signed)
Telepsych Consultation   Reason for Consult:  Psychiatric Reassessment Referring Physician:  Glendora ScoreKommor, [name], MD [facility] Location of Provider: Other: virtual home office  Patient Identification: Emily [name] MRN:  [id number] Principal Diagnosis: 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HCC) Diagnosis:  Principal Problem:   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HCC) Active Problems:   Amphetamine use disorder, severe (HCC)   Depression   Total Time spent with patient: 30 minutes  Subjective:   Emily [name] is a 36 y.o. female patient admitted with altered mental status and aggressive behaviors.  HPI:   Patient seen via telepsych by this provider; chart reviewed and consulted with Dr. Lucianne MussKumar on 02/11/22.  On evaluation Emily [name] is alert to name only, she cannot state her location and states she unsure of the current date/time of year.  She responds yes or no to most questions; when asked questions that require an open ended response, she appears confused and does not respond. She speaks in a low tone, words are mumbled but this could be due to extensive missing teeth. Pt is very limited, unable to communicate events that led to current hospitalizations.    She does endorse suicidal ideations but does not communicate a plan. She's a poor historian, and is limited by her current state.  Pt endorses, "meth" usage, in the absence of additional collateral or medical contribution to stated symptoms, pt appears to have mentally declined d/t illicit substance usage.  UDS is + for amphetamines; Head CT is wnl; ALT is elevated at 50 U/L but still within limits to start olanzapine. She is medically cleared.   Per EDP Admissions Assessment 02/09/2022: Chief Complaint(s) Altered Mental Status   HPI Emily [name] is a 36 y.o. female with PMH psychosis, polysubstance abuse, Anxiety, depression who presents to the emergency department by EMS for altered mental  status and aggressive behavior.  Patient apparently showed up to patient's sister's home and locked herself in the sister's garage.  On arrival, patient is quite somnolent and is unable to answer questions or give further history.  There is concern that the patient may be off of her medication or have used illicit substances.    Past Psychiatric History: polysubstance abuse;   Risk to Self:  yes Risk to Others:  no Prior Inpatient Therapy: unknown  Prior Outpatient Therapy:  unknown  Past Medical History:  Past Medical History:  Diagnosis Date   Anxiety    Anxiety    Depression    No past surgical history on file. Family History: No family history on file. Family Psychiatric  History: unknown Social History:  Social History   Substance and Sexual Activity  Alcohol Use Yes   Comment: 2-3 drinks but none recently per pt report     Social History   Substance and Sexual Activity  Drug Use No    Social History   Socioeconomic History   Marital status: Single    Spouse name: Not on file   Number of children: Not on file   Years of education: Not on file   Highest education level: Not on file  Occupational History   Not on file  Tobacco Use   Smoking status: Every Day    Passive exposure: Past   Smokeless tobacco: Never  Substance and Sexual Activity   Alcohol use: Yes    Comment: 2-3 drinks but none recently per pt report   Drug use: No   Sexual activity: Not Currently  Other Topics Concern   Not on file  Social History Narrative   Not on file   Social Determinants of Health   Financial Resource Strain: Not on file  Food Insecurity: Not on file  Transportation Needs: Not on file  Physical Activity: Not on file  Stress: Not on file  Social Connections: Not on file   Additional Social History:    Allergies:   Allergies  Allergen Reactions   Bee Venom Hives    Labs:  Results for orders placed or performed during the hospital encounter of 02/09/22 (from  the past 48 hour(s))  POC urine preg, ED     Status: None   Collection Time: 02/10/22  9:45 PM  Result Value Ref Range   Preg Test, Ur NEGATIVE NEGATIVE    Comment:        THE SENSITIVITY OF THIS METHODOLOGY IS >24 mIU/mL     Medications:  Current Facility-Administered Medications  Medication Dose Route Frequency Provider Last Rate Last Admin   gabapentin (NEURONTIN) capsule 100 mg  100 mg Oral BID Ophelia Shoulder E, NP       hydrOXYzine (ATARAX) tablet 25 mg  25 mg Oral TID PRN Chales Abrahams, NP       nicotine (NICODERM CQ - dosed in mg/24 hours) patch 21 mg  21 mg Transdermal Daily Ophelia Shoulder E, NP       OLANZapine zydis (ZYPREXA) disintegrating tablet 5 mg  5 mg Oral QHS Chales Abrahams, NP       No current outpatient medications on file.    Musculoskeletal: patient moves all extremities and ambulates without concerns Strength & Muscle Tone: within normal limits Gait & Station: normal Patient leans: N/A   Psychiatric Specialty Exam:  Presentation  General Appearance: Bizarre (pt appears older than he stated age; looks at this Clinical research associate mostly offering yes or no responses, appears confused)  Eye Contact:Good  Speech:Garbled  Speech Volume:Decreased  Handedness:Right   Mood and Affect  Mood:Dysphoric  Affect:Congruent; Depressed   Thought Process  Thought Processes:Disorganized  Descriptions of Associations:Tangential  Orientation:Partial (pt can state her name only)  Thought Content:Logical  History of Schizophrenia/Schizoaffective disorder:No  Duration of Psychotic Symptoms:-- (uta)  Hallucinations:Hallucinations: None  Ideas of Reference:None  Suicidal Thoughts:Suicidal Thoughts: Yes, Passive  Homicidal Thoughts:Homicidal Thoughts: No   Sensorium  Memory:Immediate Poor; Recent Poor; Remote Poor (pt unable to state what she had for breakfast and cannot state events that led to current  admission)  Judgment:Impaired  Insight:Lacking   Executive Functions  Concentration:Fair  Attention Span:Fair  Recall:Poor  Fund of Knowledge:Fair  Language:Fair   Psychomotor Activity  Psychomotor Activity:Psychomotor Activity: Normal   Assets  Assets:Resilience   Sleep  Sleep:Sleep: Fair Number of Hours of Sleep: 6    Physical Exam: Physical Exam Constitutional:      Appearance: Normal appearance.  Cardiovascular:     Rate and Rhythm: Normal rate.  Pulmonary:     Effort: Pulmonary effort is normal.  Musculoskeletal:     Cervical back: Normal range of motion.  Neurological:     Mental Status: She is alert. She is disoriented.  Psychiatric:        Attention and Perception: Perception normal.        Mood and Affect: Mood is anxious and depressed. Affect is flat.        Behavior: Behavior is withdrawn.        Thought Content: Thought content is not paranoid or delusional. Thought content includes suicidal ideation.  Thought content does not include homicidal ideation. Thought content does not include homicidal plan.        Cognition and Memory: Cognition is impaired. She exhibits impaired recent memory.        Judgment: Judgment is impulsive.   Review of Systems  Constitutional: Negative.   HENT: Negative.    Eyes: Negative.   Respiratory: Negative.    Cardiovascular: Negative.   Gastrointestinal: Negative.   Genitourinary: Negative.   Skin: Negative.   Neurological:  Negative for dizziness, tingling, tremors, seizures and headaches.  Endo/Heme/Allergies: Negative.   Psychiatric/Behavioral:  Positive for depression, substance abuse and suicidal ideas. The patient is nervous/anxious.   Blood pressure 95/62, pulse 69, temperature 98.1 F (36.7 C), temperature source Oral, resp. rate 16, height 5\' 4"  (1.626 m), weight 53.3 kg, SpO2 100 %. Body mass index is 20.19 kg/m.  Treatment Plan Summary: Patient with meth amphetamine induced mood disorder, is  disoriented, lacks judgment and has no insight.  She is a safety concern and not appropriate for discharge today.  Recommend referral for inpatient psychiatric admission where she can continue medications, be monitored for mood stability and safety.   Daily contact with patient to assess and evaluate symptoms and progress in treatment and Medication management.  EKG WNL and no prolonged QT/QTC intervals seen.    Start: Olanzapine 5mg  po daily for mood  Gabapentin 100mg  po BID for drug withdrawal sx Hydroxyzine 25mg  po TID for anxiety Nicotine patch  Disposition: Recommend psychiatric Inpatient admission when medically cleared.  This service was provided via telemedicine using a 2-way, interactive audio and video technology.  Names of all persons participating in this telemedicine service and their role in this encounter. Name: Role: Patient  Name: Role: PMHNP    , NP 02/11/2022 4:37 PM

## 2022-02-11 NOTE — ED Notes (Signed)
Pt given decaf coffee. 

## 2022-02-11 NOTE — Consult Note (Signed)
2nd attempt to reaching out requesting to see pt for psychiatric reassessment via tts cart.  Received response from [facility], RN that cart is being used and unavailable at this time.

## 2022-02-12 ENCOUNTER — Encounter (HOSPITAL_COMMUNITY): Payer: Self-pay

## 2022-02-12 LAB — RESP PANEL BY RT-PCR (FLU A&B, COVID) ARPGX2
Influenza A by PCR: NEGATIVE
Influenza B by PCR: NEGATIVE
SARS Coronavirus 2 by RT PCR: NEGATIVE

## 2022-02-12 NOTE — ED Provider Notes (Signed)
  Physical Exam  BP 93/66 (BP Location: Left Arm)   Pulse 70   Temp 98.1 F (36.7 C) (Oral)   Resp 14   Ht 5\' 4"  (1.626 m)   Wt 53.3 kg   SpO2 99%   BMI 20.19 kg/m   Physical Exam  Procedures  Procedures  ED Course / MDM    Medical Decision Making Amount and/or Complexity of Data Reviewed Labs: ordered. Radiology: ordered.   Patient has been here 68 hours.  History of methamphetamine abuse and mental status changes.  Pending inpatient psychiatric placement.       , MD 02/12/22 [phone]

## 2022-02-12 NOTE — Progress Notes (Signed)
[facility] LCSW Progress Note   02/12/2022    12:26 PM  Emily [name]   [id number]   Type of Contact and Topic:  Psychiatric Bed Placement     Patient information has been sent to [facility] via secure chat to review for potential admission. Patient has not yet been accepted at this time. Patient meets inpatient criteria per Ophelia Shoulder, NP.   Situation ongoing, CSW will continue to monitor and update note as more information becomes available.    Signed:  Corky Crafts, MSW, LCSWA, LCAS 02/12/2022 12:26 PM

## 2022-02-13 MED ORDER — IBUPROFEN 400 MG PO TABS
600.0000 mg | ORAL_TABLET | Freq: Once | ORAL | Status: AC
  Administered 2022-02-13: 600 mg via ORAL
  Filled 2022-02-13: qty 2

## 2022-02-13 NOTE — Progress Notes (Signed)
Inpatient Behavioral Health Placement  Pt meets inpatient criteria per Garrison Columbus, NP. There are no available beds per [facility] Wynonia Hazard, RN.  Referral was sent to the following facilities;   Destination Service Provider Address Phone Fax  [facility]  [address] [place] [address] [phone] [facility]  [place], [place] [address] [place]  [facility] [address] [place] [address] [phone]  [facility]  [address] [place] [address] [phone] [facility]  [address], [place] [address] [phone]  [facility]  [address], [place] [address] [phone] [place], [place] [address] [phone] [place]  [address] [place] [address] [phone] [facility]  [place], [place] [address] [phone]  [facility]  [address] [place] [address] [place]  [facility]  [address], [place] [address] [phone]  [facility]  [address], [place] [address] [phone]    Situation ongoing,  CSW will follow up.   Benjaman Kindler, MSW, [facility] 02/13/2022  @ 9:46 PM

## 2022-02-13 NOTE — Consult Note (Signed)
Telepsych Consultation   Reason for Consult:  Psych Consult Referring Physician:  Dr. Durwin Nora Location of Patient: [facility] Location of Provider: Behavioral Health TTS Department  Patient Identification: Emily [name] MRN:  [id number] Principal Diagnosis: 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HCC) Diagnosis:  Principal Problem:   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HCC) Active Problems:   Amphetamine use disorder, severe (HCC)   Depression   Total Time spent with patient: 1 hour  Subjective:   Emily [name] is a 36 y.o. female patient  HPI:  As per chart review Emily [name] is a 36 y.o. female with PMH psychosis, polysubstance abuse, Anxiety, depression who presents to the emergency department by EMS for altered mental status and aggressive behavior.  Patient apparently showed up to patient's sister's home and locked herself in the sister's garage.  On arrival, patient is quite somnolent and is unable to answer questions or give further history.  There is concern that the patient may be off of her medication or have used illicit substances.   On assessment, per Telepsych, patient is sitting on a chair and minimally responding to questions. Chart reviewed and findings shared with tx team and discussed with Dr. Lucianne Muss.  On evaluation Emily [name] is alert to name only, she cannot state her location and states she unsure of the current date/time of year.  Speech is mumbled. She responds yes or no to most questions. When asked what brought her to the hospital, reported that she fell out at her sister's house. She speaks in a low tone, words are mumbled but this could be due to extensive missing teeth and confusion. Pt is very limited, unable to communicate events that led to current hospitalizations.     Patient is a poor historian, and is limited by her current state.  Pt endorses, "meth" usage, in the absence of additional collateral or medical contribution to stated symptoms, pt appears  to have mentally declined d/t illicit substance usage.  UDS is + for amphetamines; Head CT is wnl; ALT is elevated at 50 U/L but still within limits to start olanzapine. Denied SI, HI, AVH. However, mumbled that she hears noises and sees stuff.   Disposition: She is Psych cleared may require TOC to SW when medically stable.   Past Psychiatric History: yes  Risk to Self:   Risk to Others:  no Prior Inpatient Therapy:  unsure Prior Outpatient Therapy:  unsure  Past Medical History:  Past Medical History:  Diagnosis Date   Anxiety    Anxiety    Depression    History reviewed. No pertinent surgical history.  Family History: History reviewed. No pertinent family history.  Family Psychiatric  History: None indicated  Social History:  Social History   Substance and Sexual Activity  Alcohol Use Yes   Comment: 2-3 drinks but none recently per pt report     Social History   Substance and Sexual Activity  Drug Use No    Social History   Socioeconomic History   Marital status: Single    Spouse name: Not on file   Number of children: Not on file   Years of education: Not on file   Highest education level: Not on file  Occupational History   Not on file  Tobacco Use   Smoking status: Every Day    Passive exposure: Past   Smokeless tobacco: Never  Substance and Sexual Activity   Alcohol use: Yes    Comment: 2-3 drinks but none recently per pt report  Drug use: No   Sexual activity: Not Currently  Other Topics Concern   Not on file  Social History Narrative   Not on file   Social Determinants of Health   Financial Resource Strain: Not on file  Food Insecurity: Not on file  Transportation Needs: Not on file  Physical Activity: Not on file  Stress: Not on file  Social Connections: Not on file   Additional Social History:    Allergies:   Allergies  Allergen Reactions   Bee Venom Hives    Labs:  Results for orders placed or performed during the hospital  encounter of 02/09/22 (from the past 48 hour(s))  Resp Panel by RT-PCR (Flu A&B, Covid) Nasopharyngeal Swab     Status: None   Collection Time: 02/12/22  7:02 AM   Specimen: Nasopharyngeal Swab; Nasopharyngeal(NP) swabs in vial transport medium  Result Value Ref Range   SARS Coronavirus 2 by RT PCR NEGATIVE NEGATIVE    Comment: (NOTE) SARS-CoV-2 target nucleic acids are NOT DETECTED.  The SARS-CoV-2 RNA is generally detectable in upper respiratory specimens during the acute phase of infection. The lowest concentration of SARS-CoV-2 viral copies this assay can detect is 138 copies/mL. A negative result does not preclude SARS-Cov-2 infection and should not be used as the sole basis for treatment or other patient management decisions. A negative result may occur with  improper specimen collection/handling, submission of specimen other than nasopharyngeal swab, presence of viral mutation(s) within the areas targeted by this assay, and inadequate number of viral copies(<138 copies/mL). A negative result must be combined with clinical observations, patient history, and epidemiological information. The expected result is Negative.  Fact Sheet for Patients:  BloggerCourse.com  Fact Sheet for Healthcare Providers:  SeriousBroker.it  This test is no t yet approved or cleared by the [place] FDA and  has been authorized for detection and/or diagnosis of SARS-CoV-2 by FDA under an Emergency Use Authorization (EUA). This EUA will remain  in effect (meaning this test can be used) for the duration of the COVID-19 declaration under Section 564(b)(1) of the Act, 21 U.S.C.section 360bbb-3(b)(1), unless the authorization is terminated  or revoked sooner.       Influenza A by PCR NEGATIVE NEGATIVE   Influenza B by PCR NEGATIVE NEGATIVE    Comment: (NOTE) The Xpert Xpress SARS-CoV-2/FLU/RSV plus assay is intended as an aid in the diagnosis of  influenza from Nasopharyngeal swab specimens and should not be used as a sole basis for treatment. Nasal washings and aspirates are unacceptable for Xpert Xpress SARS-CoV-2/FLU/RSV testing.  Fact Sheet for Patients: BloggerCourse.com  Fact Sheet for Healthcare Providers: SeriousBroker.it  This test is not yet approved or cleared by the [place] FDA and has been authorized for detection and/or diagnosis of SARS-CoV-2 by FDA under an Emergency Use Authorization (EUA). This EUA will remain in effect (meaning this test can be used) for the duration of the COVID-19 declaration under Section 564(b)(1) of the Act, 21 U.S.C. section 360bbb-3(b)(1), unless the authorization is terminated or revoked.  Performed at [facility], [address] [place], [place] [address]     Medications:  Current Facility-Administered Medications  Medication Dose Route Frequency Provider Last Rate Last Admin   gabapentin (NEURONTIN) capsule 100 mg  100 mg Oral BID Ophelia Shoulder E, NP   100 mg at 02/13/22 1028   hydrOXYzine (ATARAX) tablet 25 mg  25 mg Oral TID PRN Chales Abrahams, NP       nicotine (NICODERM CQ -  dosed in mg/24 hours) patch 21 mg  21 mg Transdermal Daily Ophelia Shoulder E, NP   21 mg at 02/13/22 1029   OLANZapine zydis (ZYPREXA) disintegrating tablet 5 mg  5 mg Oral QHS Ophelia Shoulder E, NP   5 mg at 02/12/22 2116   No current outpatient medications on file.    Musculoskeletal: Strength & Muscle Tone: within normal limits Gait & Station: normal Patient leans: N/A  Psychiatric Specialty Exam:  Presentation  General Appearance: Appropriate for Environment; Casual; Fairly Groomed  Eye Contact:Good  Speech:Garbled  Speech Volume:Decreased  Handedness:Right   Mood and Affect  Mood:Dysphoric  Affect:Congruent; Depressed   Thought Process  Thought Processes:Disorganized; Linear  Descriptions of  Associations:Tangential  Orientation:Partial  Thought Content:Illogical; WDL  History of Schizophrenia/Schizoaffective disorder:No  Duration of Psychotic Symptoms:-- Rich Reining)  Hallucinations:Hallucinations: None  Ideas of Reference:None  Suicidal Thoughts:Suicidal Thoughts: No SI Passive Intent and/or Plan: -- (n/a)  Homicidal Thoughts:Homicidal Thoughts: No   Sensorium  Memory:Immediate Poor; Recent Poor; Remote Poor  Judgment:Impaired  Insight:Lacking  Executive Functions  Concentration:Fair  Attention Span:Fair  Recall:Poor  Fund of Knowledge:Poor  Language:Fair   Psychomotor Activity  Psychomotor Activity:Psychomotor Activity: Normal  Assets  Assets:Communication Skills; Physical Health; Resilience   Sleep  Sleep:Sleep: Good Number of Hours of Sleep: 9  Physical Exam: Physical Exam Vitals and nursing note reviewed.  Constitutional:      Appearance: Normal appearance.  HENT:     Head: Normocephalic and atraumatic.     Right Ear: External ear normal.     Left Ear: External ear normal.     Nose: Nose normal.     Mouth/Throat:     Mouth: Mucous membranes are moist.     Pharynx: Oropharynx is clear.  Eyes:     Extraocular Movements: Extraocular movements intact.     Conjunctiva/sclera: Conjunctivae normal.     Pupils: Pupils are equal, round, and reactive to light.  Cardiovascular:     Pulses: Normal pulses.  Pulmonary:     Effort: Pulmonary effort is normal.  Abdominal:     Palpations: Abdomen is soft.  Genitourinary:    Comments: deferred Musculoskeletal:        General: Normal range of motion.     Cervical back: Normal range of motion and neck supple.  Skin:    General: Skin is warm and dry.  Neurological:     General: No focal deficit present.     Mental Status: She is alert and oriented to person, place, and time.  Psychiatric:        Behavior: Behavior normal.   Review of Systems  Constitutional: Negative.  Negative for chills  and fever.  HENT: Negative.  Negative for hearing loss and tinnitus.   Eyes: Negative.  Negative for blurred vision and double vision.  Respiratory: Negative.  Negative for cough, sputum production, shortness of breath and wheezing.   Cardiovascular: Negative.  Negative for palpitations.  Gastrointestinal:  Negative for abdominal pain, constipation, diarrhea, heartburn, nausea and vomiting.  Genitourinary: Negative.  Negative for dysuria, frequency and urgency.  Musculoskeletal: Negative.  Negative for back pain, falls, joint pain, myalgias and neck pain.  Skin: Negative.  Negative for rash.  Neurological:  Negative for dizziness, tingling, tremors, sensory change, speech change, focal weakness, seizures, loss of consciousness, weakness and headaches.  Endo/Heme/Allergies: Negative.  Negative for environmental allergies and polydipsia. Does not bruise/bleed easily.       Bee Venom Bee Venom  Hives Not Specified  06/28/2016  Psychiatric/Behavioral:  Positive for depression, hallucinations, substance abuse and suicidal ideas. The patient is nervous/anxious.   Blood pressure 99/66, pulse 82, temperature 98.4 F (36.9 C), temperature source Oral, resp. rate 17, height 5\' 4"  (1.626 m), weight 53.3 kg, SpO2 99 %. Body mass index is 20.19 kg/m.  Treatment Plan Summary: Daily contact with patient to assess and evaluate symptoms and progress in treatment and Medication management  Disposition: Recommend psychiatric Inpatient admission when medically cleared. Supportive therapy provided about ongoing stressors. Discussed crisis plan, support from social network, calling 911, coming to the Emergency Department, and calling Suicide Hotline.  This service was provided via telemedicine using a 2-way, interactive audio and video technology.  Names of all persons participating in this telemedicine service and their role in this encounter. Name: Autumn Messingylan Lewis Role: Patient  Name: Alan Mulderina Gavina Dildine Role:  Provider  Name: Dr. Lucianne MussKumar Role: Medical Director  Name: Dr. Durwin Noraixon Role: [facility]    Cecilie Lowersina C Yuli Lanigan, FNP 02/13/2022 8:09 PM

## 2022-02-13 NOTE — ED Notes (Signed)
Pt to family room with sitter for TTS. 

## 2022-02-13 NOTE — ED Provider Notes (Signed)
Emergency Medicine Observation Re-evaluation Note  Emily [name] is a 36 y.o. female, seen on rounds today.  Pt initially presented to the ED for complaints of Altered Mental Status Currently, the patient is sleeping.  Physical Exam  BP 99/69 (BP Location: Left Arm)   Pulse 69   Temp 98.6 F (37 C)   Resp 16   Ht 5\' 4"  (1.626 m)   Wt 53.3 kg   SpO2 100%   BMI 20.19 kg/m  Physical Exam General: Sleeping, nondistressed Cardiac: Extremities well-perfused Lungs: Breathing even and unlabored Psych: Deferred  ED Course / MDM  EKG:EKG Interpretation  Date/Time:  Thursday Feb 09 2022 12:33:48 EDT Ventricular Rate:  104 PR Interval:  136 QRS Duration: 74 QT Interval:  356 QTC Calculation: 468 R Axis:   89 Text Interpretation: Sinus tachycardia Right atrial enlargement Borderline ECG When compared with ECG of 02-Apr-2021 16:20, Vent. rate has increased BY  43 BPM Confirmed by [name], [name] [phone]) on 02/09/2022 3:34:32 PM  I have reviewed the labs performed to date as well as medications administered while in observation.  Recent changes in the last 24 hours include none.  Plan  Current plan is for inpatient psychiatric admission.  02/11/2022 is not under involuntary commitment.     [name], MD 02/13/22 [phone]

## 2022-02-14 LAB — RESP PANEL BY RT-PCR (FLU A&B, COVID) ARPGX2
Influenza A by PCR: NEGATIVE
Influenza B by PCR: NEGATIVE
SARS Coronavirus 2 by RT PCR: NEGATIVE

## 2022-02-14 NOTE — Progress Notes (Signed)
Inpatient Behavioral Health Placement  Pt meets inpatient criteria per Alan Mulder, NP. There are no available beds at [facility] per [facility] Fransico Michael, RN. Referral was sent to the following facilities;   Destination Service Provider Address Phone Fax  [facility]  [address] [place] [address] [phone]  [facility]  [address] [place], [place] [address] [phone]  [facility] [address] [place] [address] [phone]  [facility]  [address] [place] [address] [phone]  [facility]  [address] [place] [address] [phone]  [facility]  [address], [place] [address] [phone]  [facility]  [address], [place] [address] [phone]  [facility]  [address] [place] [address] [phone]  [facility]  [address] [place] [address] [phone]  [facility]  [address] [place] [address] [phone]  [facility]  [address], [place] [address] [phone]  [facility]  [address], [place] [address] [phone]  [facility]  [address] [place] [address] [phone]  [facility]  [address] [place] [address] [phone]  [facility]  [address] [place] [address] [phone]  [facility]  [address], [place] [address] [phone]  [facility]  [address]  [place] [address] [phone]  [facility]  [address] [place] [address] [phone]     Situation ongoing,  CSW will follow up.   Maryjean Ka, MSW, [facility] 02/14/2022  @ 9:24 PM;

## 2022-02-14 NOTE — ED Provider Notes (Signed)
Emergency Medicine Observation Re-evaluation Note  Emily [name] is a 36 y.o. female, seen on rounds today.  Pt initially presented to the ED for complaints of Altered Mental Status Currently, the patient is resting quietly.  Physical Exam  BP 107/72 (BP Location: Left Arm)   Pulse 78   Temp 98.4 F (36.9 C) (Oral)   Resp 17   Ht 5\' 4"  (1.626 m)   Wt 53.3 kg   SpO2 100%   BMI 20.19 kg/m  Physical Exam General: No acute distress Cardiac: Well-perfused Lungs: Nonlabored Psych: Cooperative  ED Course / MDM  EKG:EKG Interpretation  Date/Time:  Thursday Feb 09 2022 12:33:48 EDT Ventricular Rate:  104 PR Interval:  136 QRS Duration: 74 QT Interval:  356 QTC Calculation: 468 R Axis:   89 Text Interpretation: Sinus tachycardia Right atrial enlargement Borderline ECG When compared with ECG of 02-Apr-2021 16:20, Vent. rate has increased BY  43 BPM Confirmed by [name], [name] [phone]) on 02/09/2022 3:34:32 PM  I have reviewed the labs performed to date as well as medications administered while in observation.  Recent changes in the last 24 hours include psychiatry currently cleared.  They are recommending TOC  Plan  Current plan is for TOC evaluation.  02/11/2022 is not under involuntary commitment.     [name], MD 02/14/22 [phone]

## 2022-02-14 NOTE — Progress Notes (Signed)
CSW sent referral to [facility] for review via fax [phone]. CSW will assist and follow.   Maryjean Ka, MSW, LCSWA 02/14/2022 10:08 PM

## 2022-02-14 NOTE — ED Notes (Signed)
Ambulatory to restroom

## 2022-02-14 NOTE — Progress Notes (Signed)
Patient has been denied by [facility] due to no appropriate beds available. Patient meets BH inpatient criteria per Alan Mulder, NP. Patient has been faxed out to the following facilities:   [facility]  [address] [place] [address] [phone]  [facility]  [address] [place], [place] [address] [phone]  [facility]  [address] [place] [address] [phone]  [facility]  [address] [place] [address] [phone]  [facility]  [address] [place] [address] [phone]  [facility]  [address], [place] [address] [phone]  [facility]  [address] [place] [address] [phone]  [facility]  [address] [place] [address] [phone]  [facility]  [address] [place] [address] [phone]  [facility]  [address] [place] [address] [phone]  [facility]  [address] [place] [address] [phone]  [facility]  [address], [place] [address] [phone]   Damita Dunnings, MSW, LCSW-A  11:45 AM 02/14/2022

## 2022-02-14 NOTE — Progress Notes (Signed)
CSW attempted to call [facility] but there was no answer.  Benjaman Kindler, MSW, [facility] 02/14/2022 11:54 PM

## 2022-02-15 DIAGNOSIS — F1594 Other stimulant use, unspecified with stimulant-induced mood disorder: Secondary | ICD-10-CM

## 2022-02-15 NOTE — Progress Notes (Signed)
Patient has been denied by [facility] due to no appropriate beds available. Patient meets BH inpatient criteria per Alan Mulder, NP. Patient has been faxed out to the following facilities:   [facility]  [address] [place] [address] [phone]  [facility]  [address] [place], [place] [address] [phone]  [facility]  [address] [place] [address] [phone]  [facility]  [address] [place] [address] [phone]  [facility]  [address] [place] [address] [phone]  [facility]  [address] [place] [address] [phone]  [facility]  [address] [place] [address] [phone]  [facility]  [address] [place] [address] [phone]  [facility]  [address] [place] [address] [phone]  [facility]  [address] [place] [address] [phone]  [facility]  [address] [place] [address] [phone]  [facility]  [address] [place] [address] [phone]  [facility]  [address] [place] [address] [phone]  [facility]  [address] [place] [address] [phone]  [facility]  [address] [place] [place] [address] [phone]  [facility]  [address] [place] [address] [phone]  [facility]  [address] [place] [address] [phone]  [facility]   [address] [place] [address] [phone]   Damita Dunnings, MSW, LCSW-A  10:32 AM 02/15/2022

## 2022-02-15 NOTE — Discharge Instructions (Signed)
Please be sure to follow-up with your physician.  Return here for concerning changes in your condition. 

## 2022-02-15 NOTE — ED Notes (Signed)
Pt reports her great uncle is coming to pick her up for discharge. Pt remains in her room, calm and cooperative. VSS.

## 2022-02-15 NOTE — ED Provider Notes (Signed)
Emergency Medicine Observation Re-evaluation Note  Emily [name] is a 36 y.o. female, seen on rounds today.  Pt initially presented to the ED for complaints of Altered Mental Status Currently, the patient is awake and alert, in no distress, speaking clearly.  Physical Exam  BP 100/64 (BP Location: Left Arm)   Pulse 66   Temp 98.4 F (36.9 C)   Resp 20   Ht 5\' 4"  (1.626 m)   Wt 53.3 kg   SpO2 100%   BMI 20.19 kg/m  Physical Exam General: No distress Cardiac: Regular rate and rhythm Lungs: No increased work of breathing Psych: Calm  ED Course / MDM  EKG:EKG Interpretation  Date/Time:  Thursday Feb 09 2022 12:33:48 EDT Ventricular Rate:  104 PR Interval:  136 QRS Duration: 74 QT Interval:  356 QTC Calculation: 468 R Axis:   89 Text Interpretation: Sinus tachycardia Right atrial enlargement Borderline ECG When compared with ECG of 02-Apr-2021 16:20, Vent. rate has increased BY  43 BPM Confirmed by Emily [name] [phone]) on 02/09/2022 3:34:32 PM  I have reviewed the labs performed to date as well as medications administered while in observation.  Recent changes in the last 24 hours include discharge for behavioral health.  Plan  Current plan is for discharge.  Emily [name] is not under involuntary commitment.     Emily Muskrat, MD 02/15/22 [phone]

## 2022-02-15 NOTE — Consult Note (Signed)
Telepsych Consultation   Reason for Consult: Substance-induced psychosis Referring Physician: Emergency room physician Location of Patient: [facility] Provider[facility]  Patient Identification: Emily [name] MRN:  [id number] Principal Diagnosis: 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HCC) Diagnosis:  Principal Problem:   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HCC) Active Problems:   Amphetamine use disorder, severe (HCC)   Depression   Total Time spent with patient: 15 minutes  Subjective:   Emily [name] is a 36 y.o. female was seen and evaluated via tele-assessment.  She is awake alert and oriented x3.  She is denying suicidal or homicidal ideations.  she reports chronic auditory hallucinations.  Patient did not appear to be responding to internal or external stimuli.  Denied that she is followed by therapy and/or psychiatry.  She reports using methamphetamines.  During this hospitalization patient was initiated on Zyprexa 5 mg p.o. nightly and gabapentin 100 mg p.o. twice daily.  No reported medication side effects.  Patient was offered outpatient resources for substance abuse however declined.  CSW to follow-up with substance abuse and mental health outpatient resources.  Patient reports she currently resides with her boyfriend however states that she will have her daughter pick her up at discharge.  Patient is cleared by psychiatry.  Case staffed with attending psychiatrist MD Lucianne MussKumar.  Support, encouragement  and reassurance was provided.`  HPI: Per initial admission assessment note: "Pt is a 36 yo female who presented voluntarily via law enforcement after going to her sister's home, being asked to leave and locking herself in her sister's garage. Pt was then aggressive with LE and with ED staff reportedly. Hx of schizophrenia per chart. Hx of meth use along with a hx of other drug use. Pt reported use of meth 4 of 7 days a week but denied all other drug and  alcohol use currently. Pt reported passive SI with only thoughts of wishing to disappear without a plan or true intent. Pt denied HI, AVH, paranoia or current NSSH, Pt reported a past hx of superficial cutting but stated she had not recent episodes of cutting. Pt was calm, cooperative, alert but did not appear fully oriented with limited ability or willingness to answer verbally. Pt most often answered by nodding her head."  Past Psychiatric History:  Risk to Self:   Risk to Others:   Prior Inpatient Therapy:   Prior Outpatient Therapy:    Past Medical History:  Past Medical History:  Diagnosis Date   Anxiety    Anxiety    Depression    History reviewed. No pertinent surgical history. Family History: History reviewed. No pertinent family history. Family Psychiatric  History: Social History:  Social History   Substance and Sexual Activity  Alcohol Use Yes   Comment: 2-3 drinks but none recently per pt report     Social History   Substance and Sexual Activity  Drug Use No    Social History   Socioeconomic History   Marital status: Single    Spouse name: Not on file   Number of children: Not on file   Years of education: Not on file   Highest education level: Not on file  Occupational History   Not on file  Tobacco Use   Smoking status: Every Day    Passive exposure: Past   Smokeless tobacco: Never  Substance and Sexual Activity   Alcohol use: Yes    Comment: 2-3 drinks but none recently per pt report   Drug use: No   Sexual  activity: Not Currently  Other Topics Concern   Not on file  Social History Narrative   Not on file   Social Determinants of Health   Financial Resource Strain: Not on file  Food Insecurity: Not on file  Transportation Needs: Not on file  Physical Activity: Not on file  Stress: Not on file  Social Connections: Not on file   Additional Social History:    Allergies:   Allergies  Allergen Reactions   Bee Venom Hives    Labs:   Results for orders placed or performed during the hospital encounter of 02/09/22 (from the past 48 hour(s))  Resp Panel by RT-PCR (Flu A&B, Covid) Nasopharyngeal Swab     Status: None   Collection Time: 02/14/22  3:29 PM   Specimen: Nasopharyngeal Swab; Nasopharyngeal(NP) swabs in vial transport medium  Result Value Ref Range   SARS Coronavirus 2 by RT PCR NEGATIVE NEGATIVE    Comment: (NOTE) SARS-CoV-2 target nucleic acids are NOT DETECTED.  The SARS-CoV-2 RNA is generally detectable in upper respiratory specimens during the acute phase of infection. The lowest concentration of SARS-CoV-2 viral copies this assay can detect is 138 copies/mL. A negative result does not preclude SARS-Cov-2 infection and should not be used as the sole basis for treatment or other patient management decisions. A negative result may occur with  improper specimen collection/handling, submission of specimen other than nasopharyngeal swab, presence of viral mutation(s) within the areas targeted by this assay, and inadequate number of viral copies(<138 copies/mL). A negative result must be combined with clinical observations, patient history, and epidemiological information. The expected result is Negative.  Fact Sheet for Patients:  BloggerCourse.com  Fact Sheet for Healthcare Providers:  SeriousBroker.it  This test is no t yet approved or cleared by the [place] FDA and  has been authorized for detection and/or diagnosis of SARS-CoV-2 by FDA under an Emergency Use Authorization (EUA). This EUA will remain  in effect (meaning this test can be used) for the duration of the COVID-19 declaration under Section 564(b)(1) of the Act, 21 U.S.C.section 360bbb-3(b)(1), unless the authorization is terminated  or revoked sooner.       Influenza A by PCR NEGATIVE NEGATIVE   Influenza B by PCR NEGATIVE NEGATIVE    Comment: (NOTE) The Xpert Xpress  SARS-CoV-2/FLU/RSV plus assay is intended as an aid in the diagnosis of influenza from Nasopharyngeal swab specimens and should not be used as a sole basis for treatment. Nasal washings and aspirates are unacceptable for Xpert Xpress SARS-CoV-2/FLU/RSV testing.  Fact Sheet for Patients: BloggerCourse.com  Fact Sheet for Healthcare Providers: SeriousBroker.it  This test is not yet approved or cleared by the [place] FDA and has been authorized for detection and/or diagnosis of SARS-CoV-2 by FDA under an Emergency Use Authorization (EUA). This EUA will remain in effect (meaning this test can be used) for the duration of the COVID-19 declaration under Section 564(b)(1) of the Act, 21 U.S.C. section 360bbb-3(b)(1), unless the authorization is terminated or revoked.  Performed at [facility], [address] [place], [place] [address]     Medications:  Current Facility-Administered Medications  Medication Dose Route Frequency Provider Last Rate Last Admin   gabapentin (NEURONTIN) capsule 100 mg  100 mg Oral BID Ophelia Shoulder E, NP   100 mg at 02/14/22 2142   hydrOXYzine (ATARAX) tablet 25 mg  25 mg Oral TID PRN Chales Abrahams, NP   25 mg at 02/14/22 1006   nicotine (NICODERM CQ - dosed in  mg/24 hours) patch 21 mg  21 mg Transdermal Daily Ophelia Shoulder E, NP   21 mg at 02/14/22 1006   OLANZapine zydis (ZYPREXA) disintegrating tablet 5 mg  5 mg Oral QHS Ophelia Shoulder E, NP   5 mg at 02/14/22 2139   No current outpatient medications on file.    Musculoskeletal: Strength & Muscle Tone: within normal limits Gait & Station: normal Patient leans: N/A          Psychiatric Specialty Exam:  Presentation  General Appearance: Appropriate for Environment  Eye Contact:Good  Speech:Clear and Coherent  Speech Volume:Normal  Handedness:Right   Mood and Affect  Mood:Anxious  Affect:Congruent   Thought Process   Thought Processes:Coherent  Descriptions of Associations:Intact  Orientation:Full (Time, Place and Person)  Thought Content:Logical  History of Schizophrenia/Schizoaffective disorder:No  Duration of Psychotic Symptoms:-- Emily [name])  Hallucinations:Hallucinations: None  Ideas of Reference:None  Suicidal Thoughts:Suicidal Thoughts: No  Homicidal Thoughts:Homicidal Thoughts: No   Sensorium  Memory:Immediate Fair; Recent Good; Remote Fair  Judgment:Fair  Insight:Fair   Executive Functions  Concentration:Fair  Attention Span:Fair  Recall:Good  Fund of Knowledge:Good  Language:Fair   Psychomotor Activity  Psychomotor Activity:Psychomotor Activity: Normal   Assets  Assets:Desire for Improvement; Social Support   Sleep  Sleep:Sleep: Fair    Physical Exam: Physical Exam Vitals and nursing note reviewed.  Cardiovascular:     Rate and Rhythm: Normal rate and regular rhythm.  Pulmonary:     Effort: Pulmonary effort is normal.     Breath sounds: Normal breath sounds.  Neurological:     Mental Status: She is oriented to person, place, and time.  Psychiatric:        Attention and Perception: Attention and perception normal.        Mood and Affect: Mood normal.        Speech: Speech normal. Slurred: can be difficult to understand do to poor dention.        Thought Content: Thought content normal.   Review of Systems  Cardiovascular: Negative.   Gastrointestinal: Negative.   Genitourinary: Negative.   Skin: Negative.   Psychiatric/Behavioral:  Positive for substance abuse. Negative for depression and suicidal ideas. The patient is not nervous/anxious.   All other systems reviewed and are negative. Blood pressure 100/64, pulse 66, temperature 98.4 F (36.9 C), resp. rate 20, height 5\' 4"  (1.626 m), weight 53.3 kg, SpO2 100 %. Body mass index is 20.19 kg/m. NP spoke to Dr. regarding discharge disposition recommendation  Disposition: No evidence of  imminent risk to self or others at present.   Patient does not meet criteria for psychiatric inpatient admission. Supportive therapy provided about ongoing stressors. Refer to IOP. Discussed crisis plan, support from social network, calling 911, coming to the Emergency Department, and calling Suicide Hotline.  This service was provided via telemedicine using a 2-way, interactive audio and video technology.  Names of all persons participating in this telemedicine service and their role in this encounter. Name: Jeraldine Loots Role: patient   Name: Glenice Laine  Role: NP           Hillery Jacks, NP 02/15/2022 10:15 AM

## 2022-07-21 ENCOUNTER — Emergency Department (HOSPITAL_COMMUNITY): Payer: Self-pay

## 2022-07-21 ENCOUNTER — Emergency Department (HOSPITAL_COMMUNITY)
Admission: EM | Admit: 2022-07-21 | Discharge: 2022-07-23 | Disposition: A | Discharge status: Other Acute Inpatient Hospital | Payer: Federal, State, Local not specified - Other | Attending: Emergency Medicine | Admitting: Emergency Medicine

## 2022-07-21 DIAGNOSIS — R410 Disorientation, unspecified: Secondary | ICD-10-CM | POA: Insufficient documentation

## 2022-07-21 DIAGNOSIS — R4182 Altered mental status, unspecified: Secondary | ICD-10-CM | POA: Insufficient documentation

## 2022-07-21 DIAGNOSIS — F200 Paranoid schizophrenia: Secondary | ICD-10-CM | POA: Insufficient documentation

## 2022-07-21 DIAGNOSIS — Z1152 Encounter for screening for COVID-19: Secondary | ICD-10-CM | POA: Insufficient documentation

## 2022-07-21 DIAGNOSIS — R42 Dizziness and giddiness: Secondary | ICD-10-CM | POA: Insufficient documentation

## 2022-07-21 DIAGNOSIS — Z0279 Encounter for issue of other medical certificate: Secondary | ICD-10-CM | POA: Insufficient documentation

## 2022-07-21 HISTORY — DX: Schizophrenia, unspecified: F20.9

## 2022-07-21 LAB — COMPREHENSIVE METABOLIC PANEL
ALT: 24 U/L (ref 0–44)
AST: 30 U/L (ref 15–41)
Albumin: 3.6 g/dL (ref 3.5–5.0)
Alkaline Phosphatase: 33 U/L — ABNORMAL LOW (ref 38–126)
Anion gap: 7 (ref 5–15)
BUN: 6 mg/dL (ref 6–20)
CO2: 24 mmol/L (ref 22–32)
Calcium: 8.5 mg/dL — ABNORMAL LOW (ref 8.9–10.3)
Chloride: 106 mmol/L (ref 98–111)
Creatinine, Ser: 0.58 mg/dL (ref 0.44–1.00)
GFR, Estimated: >60 mL/min (ref >60)
Glucose, Bld: 96 mg/dL (ref 70–99)
Potassium: 3.6 mmol/L (ref 3.5–5.1)
Sodium: 137 mmol/L (ref 135–145)
Total Bilirubin: 0.5 mg/dL (ref 0.3–1.2)
Total Protein: 6.5 g/dL (ref 6.5–8.1)

## 2022-07-21 LAB — CBC WITH DIFFERENTIAL/PLATELET
Abs Immature Granulocytes: 0.01 10*3/uL (ref 0.00–0.07)
Basophils Absolute: 0 10*3/uL (ref 0.0–0.1)
Basophils Relative: 1 %
Eosinophils Absolute: 0.1 10*3/uL (ref 0.0–0.5)
Eosinophils Relative: 1 %
HCT: 37.4 % (ref 36.0–46.0)
Hemoglobin: 12.4 g/dL (ref 12.0–15.0)
Immature Granulocytes: 0 %
Lymphocytes Relative: 32 %
Lymphs Abs: 1.5 10*3/uL (ref 0.7–4.0)
MCH: 31 pg (ref 26.0–34.0)
MCHC: 33.2 g/dL (ref 30.0–36.0)
MCV: 93.5 fL (ref 80.0–100.0)
Monocytes Absolute: 0.5 10*3/uL (ref 0.1–1.0)
Monocytes Relative: 11 %
Neutro Abs: 2.6 10*3/uL (ref 1.7–7.7)
Neutrophils Relative %: 55 %
Platelets: 248 10*3/uL (ref 150–400)
RBC: 4 MIL/uL (ref 3.87–5.11)
RDW: 13.2 % (ref 11.5–15.5)
WBC: 4.7 10*3/uL (ref 4.0–10.5)
nRBC: 0 % (ref 0.0–0.2)

## 2022-07-21 LAB — RAPID URINE DRUG SCREEN, HOSP PERFORMED
Amphetamines: NOT DETECTED
Barbiturates: NOT DETECTED
Benzodiazepines: NOT DETECTED
Cocaine: NOT DETECTED
Opiates: NOT DETECTED
Tetrahydrocannabinol: NOT DETECTED

## 2022-07-21 LAB — RESP PANEL BY RT-PCR (FLU A&B, COVID) ARPGX2
Influenza A by PCR: NEGATIVE
Influenza B by PCR: NEGATIVE
SARS Coronavirus 2 by RT PCR: NEGATIVE

## 2022-07-21 LAB — HCG, QUANTITATIVE, PREGNANCY: hCG, Beta Chain, Quant, S: <1 m[IU]/mL (ref <5)

## 2022-07-21 LAB — POC URINE PREG, ED: Preg Test, Ur: NEGATIVE

## 2022-07-21 LAB — ETHANOL: Alcohol, Ethyl (B): <10 mg/dL (ref <10)

## 2022-07-21 LAB — PREGNANCY, URINE: Preg Test, Ur: NEGATIVE

## 2022-07-21 MED ORDER — ZIPRASIDONE MESYLATE 20 MG IM SOLR
10.0000 mg | Freq: Once | INTRAMUSCULAR | Status: AC
  Administered 2022-07-21: 10 mg via INTRAMUSCULAR
  Filled 2022-07-21: qty 20

## 2022-07-21 MED ORDER — SODIUM CHLORIDE 0.9 % IV BOLUS: 1000.0000 mL | Freq: Once | INTRAVENOUS | Status: DC

## 2022-07-21 MED ORDER — STERILE WATER FOR INJECTION IJ SOLN
INTRAMUSCULAR | Status: AC
  Administered 2022-07-21: 0.6 mL
  Filled 2022-07-21: qty 10

## 2022-07-21 NOTE — ED Notes (Signed)
Unable to obtain signature at time of triage due to pt presentation.

## 2022-07-21 NOTE — ED Notes (Signed)
TTS Cart moved to bedside. Consult attempted but Pt shut down and refused to cooperate with counselor via Telecommunication. [name] Counselor agrees a 3rd attempt will be tried again later.

## 2022-07-21 NOTE — ED Notes (Signed)
Pt's daughter has returned to the room and is currently sitting with pt.
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
## 2022-07-21 NOTE — ED Notes (Signed)
Pt has random episodes of shaking and then sits up to eat or take a sip of her drink.Marland Kitchen

## 2022-07-21 NOTE — ED Notes (Signed)
Pt ripping cords off getting up out of bed, pt seen going to bathroom; daughter in with pt in bathroom and attempted to get urine specimen and unable to at the moment  Pt ambulated back to bed and pt placed back on monitor. After pt back in bed pt seen moving and jerking all in the bed  Daughter came out and stated she would be back soon

## 2022-07-21 NOTE — ED Notes (Signed)
Pt's adult daughter is at bedside and notes that pt has attempted self harm in the past and that she believes this is a possibility at this time due to pt's current behavior. Charge nurse notified that Air cabin crew may be required.

## 2022-07-21 NOTE — ED Notes (Signed)
Pt briefly woke and asked for food and drink. RN provided apple sauce, sandwich bag, water, and a protein shake due to pt's poor dentition (no teeth noted).

## 2022-07-21 NOTE — ED Notes (Addendum)
Pt stood from stretcher, removed all cords and cables, and ambulated independently to restroom with her eyes closed. She returned to her room with her pants still down and climbed back onto the stretcher with her eyes still closed. Pt is not interacting with staff but has allowed x ray and vitals.

## 2022-07-21 NOTE — ED Notes (Signed)
TTS attempted 1945 - pt would not talk to them

## 2022-07-21 NOTE — ED Notes (Signed)
Pt returned from CT without incident

## 2022-07-21 NOTE — ED Notes (Signed)
Pt pulled her IV out. In and out cath performed to obtain urine sample; small volume of urine obtained and sent to lab. POC urine pregnancy noted to be negative.

## 2022-07-21 NOTE — ED Notes (Signed)
Pt provided with dinner tray and graham crackers. She is currently lying in bed in NAD although she is still occasionally thrashing as previously described.

## 2022-07-21 NOTE — ED Notes (Signed)
Patient transported to CT 

## 2022-07-21 NOTE — ED Notes (Signed)
Pt seen sitting up in bed eating applesauce; pt given another cup of water

## 2022-07-21 NOTE — ED Notes (Signed)
RN attempted to communicate with Pt. Pt refuses to acknowledge verbal communication. Pt refuses to speak to TTS Counselor at this time.

## 2022-07-21 NOTE — ED Notes (Signed)
Called dietary to inform of change to diet order (dys 2 soft) due to pt dentition

## 2022-07-21 NOTE — ED Notes (Signed)
Pt got out of bed to forcefully remove the TTS Cart from the treatment room. Pt presents agitated at this time. EDP notified.

## 2022-07-21 NOTE — BH Assessment (Signed)
TTS attempted to assess, Pt refused to talk, unable to assess at this time.

## 2022-07-21 NOTE — BH Assessment (Signed)
TTS attempted to see will follow up with Pt's nurse. 

## 2022-07-21 NOTE — ED Provider Notes (Signed)
  Physical Exam  BP 102/68 (BP Location: Left Arm)   Pulse (!) 109   Temp 98.9 F (37.2 C) (Axillary)   Resp 14   Ht 5\' 4"  (1.626 m)   Wt 49.9 kg   SpO2 99%   BMI 18.88 kg/m   Physical Exam  Procedures  Procedures  ED Course / MDM    Medical Decision Making Amount and/or Complexity of Data Reviewed Labs: ordered. Radiology: ordered. ECG/medicine tests: ordered.  Risk Prescription drug management.   Received patient in signout.  Sent for medical clearance.  Mental status change with substance abuse and schizophrenia history.  Required some sedation here.  This point patient is still little sedate but is medically cleared and ready for TTS evaluation.       Davonna Belling, MD 07/21/22 1530
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
## 2022-07-21 NOTE — ED Notes (Signed)
Pt observed now sitting up in bed, eyes open and staring at ED Staff intently from her treatment room.

## 2022-07-21 NOTE — ED Notes (Signed)
Pt continues to occasionally rhythmically thrash around in bed with arms and legs shaking, swinging head back and forth with eyes closed. This happens for 15-20 seconds at a time and then pt abruptly stops and returns to baseline. This just happened approx one minute ago and pt is currently sitting up in bed eating chips. NAD noted. Safety sitter remains at bedside.

## 2022-07-22 ENCOUNTER — Encounter (HOSPITAL_COMMUNITY): Payer: Self-pay

## 2022-07-22 ENCOUNTER — Other Ambulatory Visit: Payer: Self-pay

## 2022-07-22 DIAGNOSIS — F209 Schizophrenia, unspecified: Secondary | ICD-10-CM | POA: Diagnosis not present

## 2022-07-22 DIAGNOSIS — R4182 Altered mental status, unspecified: Secondary | ICD-10-CM | POA: Diagnosis not present

## 2022-07-22 MED ORDER — SERTRALINE HCL 50 MG PO TABS
50.0000 mg | ORAL_TABLET | Freq: Every day | ORAL | Status: DC
  Administered 2022-07-22 – 2022-07-23 (×2): 50 mg via ORAL
  Filled 2022-07-22 (×2): qty 1

## 2022-07-22 MED ORDER — HYDROXYZINE HCL 25 MG PO TABS
25.0000 mg | ORAL_TABLET | Freq: Three times a day (TID) | ORAL | Status: DC | PRN
  Administered 2022-07-23: 25 mg via ORAL
  Filled 2022-07-22 (×2): qty 1

## 2022-07-22 MED ORDER — RISPERIDONE 1 MG PO TBDP
2.0000 mg | ORAL_TABLET | Freq: Three times a day (TID) | ORAL | Status: DC | PRN
  Administered 2022-07-23: 2 mg via ORAL
  Filled 2022-07-22: qty 2

## 2022-07-22 MED ORDER — LORAZEPAM 1 MG PO TABS: 1.0000 mg | ORAL_TABLET | ORAL | Status: DC | PRN

## 2022-07-22 MED ORDER — HALOPERIDOL 0.5 MG PO TABS
1.0000 mg | ORAL_TABLET | Freq: Two times a day (BID) | ORAL | Status: DC
  Administered 2022-07-22 – 2022-07-23 (×3): 1 mg via ORAL
  Filled 2022-07-22 (×3): qty 2

## 2022-07-22 MED ORDER — TRAZODONE HCL 50 MG PO TABS
50.0000 mg | ORAL_TABLET | Freq: Every day | ORAL | Status: DC
  Administered 2022-07-22: 50 mg via ORAL
  Filled 2022-07-22: qty 1

## 2022-07-22 MED ORDER — BENZTROPINE MESYLATE 1 MG PO TABS
0.5000 mg | ORAL_TABLET | Freq: Two times a day (BID) | ORAL | Status: DC
  Administered 2022-07-22 – 2022-07-23 (×3): 0.5 mg via ORAL
  Filled 2022-07-22 (×3): qty 1

## 2022-07-22 MED ORDER — ZIPRASIDONE MESYLATE 20 MG IM SOLR: 20.0000 mg | INTRAMUSCULAR | Status: DC | PRN

## 2022-07-22 NOTE — Progress Notes (Signed)
Patient lying in bed at this time, eyes closed, even rise and fall of chest. NAD noted at this time. Sitter remains at bedside.

## 2022-07-22 NOTE — ED Notes (Addendum)
Pt came out of room acting like she was going to restroom when she began running to the door and outside.  She ran under a tree and pulled her gown up.  Lenna Sciara, RN , myself, Benjamine Mola, CNA and [place], CNA all encouraged her to come inside.  At that time her daughter, Ubaldo Glassing was called.  Ubaldo Glassing stated she was not able to care for her due to working all day and does not feel her mother is capable of caring for herself.  She is concerned for her welfare.  Dr. Dayna Barker aware and beginning IVC papers.

## 2022-07-22 NOTE — Consult Note (Signed)
Telepsych Consultation   Reason for Consult: Psych consult  Referring Physician: Dr. Rubin Payor  Location of Patient: Emily [name], ED hospital  Location of Provider: [facility]  Patient Identification: Emily [name]  MRN:  [id number]  Principal Diagnosis: Altered mental state, history of schizophrenia and substance use.  Diagnosis: Schizophrenic and acute decompensation without taking meds   Total Time spent with patient: 45 minutes  Subjective:   Emily [name] is a 35 y.o. female patient admitted with reportedly shizophrenic and acutely decompensated on the context of not taking her psychotropic medications.  Patient speech is minimal with thought blocking.  Only able to state her name, does not remember her date of birth or situation surrounding her hospitalization.  Responds to yes or no questions only. Per chart review: Patient was seen twice in ED in the last 6 months for hallucinations.  She was admitted, treated and discharge from [facility] in July 2022, for suicidal ideation.  At that time patient was scheduled appointment at [facility] for therapy and medication management, and [facility] health department for follow-up appointment for services for physical health concerns.  Daughter reports, she was not sure if patient follow-up with this appointment.  Collateral information: Patient's daughter Alfonse Flavors at [phone] called with patient permission for more information.  Patient daughter states that patient was not missing for 2 years as reported, however was living with her friend.  When condition becomes unbearable there, patient returned to her sister who is unable to provide care for patient at this time.  Daughter reports patient presents with bizarre behavior, disorganized, paranoid, unable to carry out ADLs, and exhibiting thought blocking, and refusing to talk occasionally.  Daughter reports that she cannot care for her mother at this time since she  works full-time to support herself.  HPI: As per Emily [name] initial intake notes: Emily [name] is a 36 year old Caucasian female with reportedly shizophrenic and acutely decompensated without taking medication. She has been medically cleared but not participating with TTS. Difficult to tell if it is behavioral or psychiatric. Throughout the night she kept leaving her room, banging her head with her hands. Ultimately she eloped from the emergency department and had to be brought back by authorities, she was subsequently IVC'ed and sedated for same.   Past Psychiatric History: Altered mental state, amphetamine use disorder severe, cannabis use disorder severe dependence, depression, amphetamine induced mood disorder, and tobacco use disorder.  Risk to Self:  Yes Risk to Others:  No Prior Inpatient Therapy:  Yes Prior Outpatient Therapy:  Yes  Past Medical History:  Past Medical History:  Diagnosis Date   Anxiety    Anxiety    Depression    Schizophrenia (HCC)    No past surgical history on file.  Family History: None indicated as per patient's daughter.  Family Psychiatric  History:   Social History:  Social History   Substance and Sexual Activity  Alcohol Use Yes   Comment: 2-3 drinks but none recently per pt report     Social History   Substance and Sexual Activity  Drug Use No    Social History   Socioeconomic History   Marital status: Single    Spouse name: Not on file   Number of children: Not on file   Years of education: Not on file   Highest education level: Not on file  Occupational History   Not on file  Tobacco Use   Smoking status: Every Day    Passive  exposure: Past   Smokeless tobacco: Never  Substance and Sexual Activity   Alcohol use: Yes    Comment: 2-3 drinks but none recently per pt report   Drug use: No   Sexual activity: Not Currently  Other Topics Concern   Not on file  Social History Narrative   Not on file   Social Determinants of  Health   Financial Resource Strain: Not on file  Food Insecurity: Not on file  Transportation Needs: Not on file  Physical Activity: Not on file  Stress: Not on file  Social Connections: Not on file   Additional Social History:    Allergies:   Allergies  Allergen Reactions   Bee Venom Hives    Labs:  Results for orders placed or performed during the hospital encounter of 07/21/22 (from the past 48 hour(s))  CBC with Differential     Status: None   Collection Time: 07/21/22 11:36 AM  Result Value Ref Range   WBC 4.7 4.0 - 10.5 K/uL   RBC 4.00 3.87 - 5.11 MIL/uL   Hemoglobin 12.4 12.0 - 15.0 g/dL   HCT 24.2 68.3 - 41.9 %   MCV 93.5 80.0 - 100.0 fL   MCH 31.0 26.0 - 34.0 pg   MCHC 33.2 30.0 - 36.0 g/dL   RDW 62.2 29.7 - 98.9 %   Platelets 248 150 - 400 K/uL   nRBC 0.0 0.0 - 0.2 %   Neutrophils Relative % 55 %   Neutro Abs 2.6 1.7 - 7.7 K/uL   Lymphocytes Relative 32 %   Lymphs Abs 1.5 0.7 - 4.0 K/uL   Monocytes Relative 11 %   Monocytes Absolute 0.5 0.1 - 1.0 K/uL   Eosinophils Relative 1 %   Eosinophils Absolute 0.1 0.0 - 0.5 K/uL   Basophils Relative 1 %   Basophils Absolute 0.0 0.0 - 0.1 K/uL   Immature Granulocytes 0 %   Abs Immature Granulocytes 0.01 0.00 - 0.07 K/uL    Comment: Performed at [facility], [address] [place], [place] [address]  Comprehensive metabolic panel     Status: Abnormal   Collection Time: 07/21/22 11:36 AM  Result Value Ref Range   Sodium 137 135 - 145 mmol/L   Potassium 3.6 3.5 - 5.1 mmol/L   Chloride 106 98 - 111 mmol/L   CO2 24 22 - 32 mmol/L   Glucose, Bld 96 70 - 99 mg/dL    Comment: Glucose reference range applies only to samples taken after fasting for at least 8 hours.   BUN 6 6 - 20 mg/dL   Creatinine, Ser 1.74 0.44 - 1.00 mg/dL   Calcium 8.5 (L) 8.9 - 10.3 mg/dL   Total Protein 6.5 6.5 - 8.1 g/dL   Albumin 3.6 3.5 - 5.0 g/dL   AST 30 15 - 41 U/L   ALT 24 0 - 44 U/L   Alkaline Phosphatase 33 (L) 38 - 126 U/L   Total  Bilirubin 0.5 0.3 - 1.2 mg/dL   GFR, Estimated >08 >14 mL/min    Comment: (NOTE) Calculated using the CKD-EPI Creatinine Equation (2021)    Anion gap 7 5 - 15    Comment: Performed at [facility], [address], [place], [place] [address]  Ethanol     Status: None   Collection Time: 07/21/22 11:36 AM  Result Value Ref Range   Alcohol, Ethyl (B) <10 <10 mg/dL    Comment: (NOTE) Lowest detectable limit for serum alcohol is 10 mg/dL.  For medical purposes only. Performed at [facility], [address] [place], [place] [address]   hCG, quantitative, pregnancy     Status: None   Collection Time: 07/21/22 11:36 AM  Result Value Ref Range   hCG, Beta Chain, Quant, S <1 <5 mIU/mL    Comment:          GEST. AGE      CONC.  (mIU/mL)   <=1 WEEK        5 - 50     2 WEEKS       50 - 500     3 WEEKS       100 - 10,000     4 WEEKS     1,000 - 30,000     5 WEEKS     3,500 - 115,000   6-8 WEEKS     12,000 - 270,000    12 WEEKS     15,000 - 220,000        FEMALE AND NON-PREGNANT FEMALE:     LESS THAN 5 mIU/mL Performed at [facility], [address] [place], [place] [address]   POC urine preg, ED (not at [facility])     Status: None   Collection Time: 07/21/22  1:18 PM  Result Value Ref Range   Preg Test, Ur NEGATIVE NEGATIVE    Comment:        THE SENSITIVITY OF THIS METHODOLOGY IS >24 mIU/mL   Rapid urine drug screen (hospital performed)     Status: None   Collection Time: 07/21/22  1:21 PM  Result Value Ref Range   Opiates NONE DETECTED NONE DETECTED   Cocaine NONE DETECTED NONE DETECTED   Benzodiazepines NONE DETECTED NONE DETECTED   Amphetamines NONE DETECTED NONE DETECTED   Tetrahydrocannabinol NONE DETECTED NONE DETECTED   Barbiturates NONE DETECTED NONE DETECTED    Comment: (NOTE) DRUG SCREEN FOR MEDICAL PURPOSES ONLY.  IF CONFIRMATION IS NEEDED FOR ANY PURPOSE, NOTIFY LAB WITHIN 5 DAYS.  LOWEST DETECTABLE LIMITS FOR URINE DRUG SCREEN Drug Class                     Cutoff  (ng/mL) Amphetamine and metabolites    1000 Barbiturate and metabolites    200 Benzodiazepine                 200 Opiates and metabolites        300 Cocaine and metabolites        300 THC                            50 Performed at [facility], [address] [place], [place] [address]   Pregnancy, urine     Status: None   Collection Time: 07/21/22  1:22 PM  Result Value Ref Range   Preg Test, Ur NEGATIVE NEGATIVE    Comment:        THE SENSITIVITY OF THIS METHODOLOGY IS >20 mIU/mL. Performed at [facility], [address] [place], [place] [address]   Resp Panel by RT-PCR (Flu A&B, Covid) Anterior Nasal Swab     Status: None   Collection Time: 07/21/22  3:53 PM   Specimen: Anterior Nasal Swab  Result Value Ref Range   SARS Coronavirus 2 by RT PCR NEGATIVE NEGATIVE    Comment: (NOTE) SARS-CoV-2 target nucleic acids are NOT DETECTED.  The SARS-CoV-2 RNA is generally detectable in upper respiratory specimens during the  acute phase of infection. The lowest concentration of SARS-CoV-2 viral copies this assay can detect is 138 copies/mL. A negative result does not preclude SARS-Cov-2 infection and should not be used as the sole basis for treatment or other patient management decisions. A negative result may occur with  improper specimen collection/handling, submission of specimen other than nasopharyngeal swab, presence of viral mutation(s) within the areas targeted by this assay, and inadequate number of viral copies(<138 copies/mL). A negative result must be combined with clinical observations, patient history, and epidemiological information. The expected result is Negative.  Fact Sheet for Patients:  EntrepreneurPulse.com.au  Fact Sheet for Healthcare Providers:  IncredibleEmployment.be  This test is no t yet approved or cleared by the [place] FDA and  has been authorized for detection and/or diagnosis of SARS-CoV-2 by FDA under  an Emergency Use Authorization (EUA). This EUA will remain  in effect (meaning this test can be used) for the duration of the COVID-19 declaration under Section 564(b)(1) of the Act, 21 U.S.C.section 360bbb-3(b)(1), unless the authorization is terminated  or revoked sooner.       Influenza A by PCR NEGATIVE NEGATIVE   Influenza B by PCR NEGATIVE NEGATIVE    Comment: (NOTE) The Xpert Xpress SARS-CoV-2/FLU/RSV plus assay is intended as an aid in the diagnosis of influenza from Nasopharyngeal swab specimens and should not be used as a sole basis for treatment. Nasal washings and aspirates are unacceptable for Xpert Xpress SARS-CoV-2/FLU/RSV testing.  Fact Sheet for Patients: EntrepreneurPulse.com.au  Fact Sheet for Healthcare Providers: IncredibleEmployment.be  This test is not yet approved or cleared by the [place] FDA and has been authorized for detection and/or diagnosis of SARS-CoV-2 by FDA under an Emergency Use Authorization (EUA). This EUA will remain in effect (meaning this test can be used) for the duration of the COVID-19 declaration under Section 564(b)(1) of the Act, 21 U.S.C. section 360bbb-3(b)(1), unless the authorization is terminated or revoked.  Performed at [facility], [address], [place], [place] [address]     Medications:  Current Facility-Administered Medications  Medication Dose Route Frequency Provider Last Rate Last Admin   sodium chloride 0.9 % bolus 1,000 mL  1,000 mL Intravenous Once Milton Ferguson, MD       No current outpatient medications on file.   Musculoskeletal: Strength & Muscle Tone: within normal limits Gait & Station: normal Patient leans: N/A  Psychiatric Specialty Exam:  Presentation  General Appearance:  Appropriate for Environment; Bizarre; Disheveled  Eye Contact: Fair  Speech: Blocked (Able to respond to yes or no questions with shaking her head.  Speech blocked and  delayed.)  Speech Volume: Other (comment) (Refusing to communicate.)  Handedness: Right  Mood and Affect  Mood: Dysphoric; Anxious  Affect: Congruent  Thought Process  Thought Processes: Other (comment) (Unable to assess.)  Descriptions of Associations:Loose  Orientation:Partial (Able to state her name only.  Does not remember her date of birth, or what brought her to the hospital.)  Thought Content:Illogical; Paranoid Ideation; Scattered  History of Schizophrenia/Schizoaffective disorder:No (Patient daughter Adalberto Ill reports patient has been diagnosed with schizophrenia.  And exhibits bizarre behavior.)  Duration of Psychotic Symptoms:N/A  Hallucinations:Hallucinations: -- (Unable to assess due to blocked speech.  Patient daughter Adalberto Ill report patient has bizarre behavior and seeing people that are not there.)  Ideas of Reference:None  Suicidal Thoughts:Suicidal Thoughts: No  Homicidal Thoughts:Homicidal Thoughts: No  Sensorium  Memory: Immediate Poor; Recent Poor; Remote Poor  Judgment: Poor  Insight: Poor  Executive  Functions  Concentration: Fair  Attention Span: Fair  Recall: Other (comment) (Able to assess)  Fund of Knowledge: Poor  Language: Fair  Psychomotor Activity  Psychomotor Activity: Psychomotor Activity: Increased; Restlessness (Restless hand movements.)  Assets  Assets: Communication Skills; Physical Health; Social Support  Sleep  Sleep: Sleep: Good Number of Hours of Sleep: 7  Physical Exam: Physical Exam Vitals and nursing note reviewed.  HENT:     Head: Normocephalic.     Right Ear: External ear normal.     Left Ear: External ear normal.     Nose: Nose normal.     Mouth/Throat:     Mouth: Mucous membranes are moist.     Pharynx: Oropharynx is clear.  Eyes:     Extraocular Movements: Extraocular movements intact.     Conjunctiva/sclera: Conjunctivae normal.     Pupils: Pupils are equal, round, and  reactive to light.  Cardiovascular:     Rate and Rhythm: Normal rate.     Pulses: Normal pulses.  Pulmonary:     Effort: Pulmonary effort is normal.  Abdominal:     Palpations: Abdomen is soft.  Genitourinary:    Comments: Deferred Musculoskeletal:     Cervical back: Normal range of motion.  Skin:    General: Skin is warm.  Neurological:     General: No focal deficit present.     Mental Status: She is alert.  Psychiatric:        Mood and Affect: Mood normal.    Review of Systems  Constitutional: Negative.  Negative for chills and fever.  HENT: Negative.  Negative for hearing loss and tinnitus.   Eyes: Negative.  Negative for blurred vision and double vision.  Respiratory: Negative.  Negative for cough, sputum production, shortness of breath and wheezing.   Cardiovascular: Negative.  Negative for chest pain and palpitations.  Gastrointestinal: Negative.  Negative for heartburn, nausea and vomiting.  Genitourinary: Negative.  Negative for dysuria, frequency and urgency.  Musculoskeletal: Negative.  Negative for myalgias and neck pain.  Skin: Negative.  Negative for rash.  Neurological: Negative.  Negative for dizziness, tingling and headaches.  Endo/Heme/Allergies: Negative.  Negative for environmental allergies and polydipsia. Does not bruise/bleed easily.  Psychiatric/Behavioral:  Positive for depression.    Blood pressure 112/67, pulse 92, temperature 98.2 F (36.8 C), resp. rate 17, height 5\' 4"  (1.626 m), weight 49.9 kg, SpO2 100 %. Body mass index is 18.88 kg/m.  Treatment Plan Summary: Daily contact with patient to assess and evaluate symptoms and progress in treatment and Medication management.  Plan: -- Recommend inpatient psych placement.  Major depressive disorder: --Initiate sertraline 50 mg p.o. daily for depression  Psychosis: --Initiate Haloperidol 1 mg tablets p.o. 2 times daily, for psychosis -- Initiate Cogentin 0.5 mg p.o. 2 times daily, for EPS  preventative symptoms.  Insomnia: --Initiate trazodone 50 mg p.o. q. Nightly  Anxiety: -- Initiate hydroxyzine 25 mg p.o. 3 times daily as needed  Agitation protocol: -- Risperidone disintegrating tabs 2 mg p.o. every 8 hours as needed for agitation and -- Lorazepam tablet 1 mg p.o. as needed for anxiety, and severe agitation for 1 dose and --Ziprasidone Geodon injection 20 mg IM as needed agitation for 1 dose.   Disposition: Recommend psychiatric Inpatient admission when medically cleared.  CSW to pursue appropriate inpatient placement options for patient.  This service was provided via telemedicine using a 2-way, interactive audio and video technology.  Names of all persons participating in this telemedicine service and their role  in this encounter. Name: Emily [name] Role: Patient  Name: Alan Mulder, NP Role: Provider  Name: Dr. Rubin Payor Role: Emily [name] ED physician  Name: Dr. Lucianne Muss Role: Medical Director    Cecilie Lowers, FNP 07/22/2022 10:47 AM

## 2022-07-22 NOTE — ED Notes (Signed)
Pt walked out of room into the hallway. Pt was blocked by sitter and pt returned to room and layed down where pt began shaking her arms around her head and rocking.

## 2022-07-22 NOTE — ED Notes (Signed)
Pt is purposefully breathing hard and rocking back and forth.

## 2022-07-22 NOTE — ED Provider Notes (Signed)
Emergency Medicine Observation Re-evaluation Note  Emily [name] is a 36 y.o. female, seen on rounds today.  Pt initially presented to the ED for complaints of Medical Clearance Currently, the patient is resting comfortably no acute distress.  Physical Exam  BP 112/67 (BP Location: Left Arm)   Pulse 92   Temp 98.2 F (36.8 C)   Resp 17   Ht 5\' 4"  (1.626 m)   Wt 49.9 kg   SpO2 100%   BMI 18.88 kg/m  Physical Exam General: Appears to be resting comfortably in bed, no acute distress. Cardiac: Regular rate, normal heart rate, non-emergent blood pressure for this morning's vitals. Lungs: No increased work of breathing.  Equal chest rise appreciated Psych: Calm, asleep in bed.   ED Course / MDM  EKG:   I have reviewed the labs performed to date as well as medications administered while in observation.  Recent changes in the last 24 hours include patient required IVC overnight.  Patient sedated overnight for aggressive behavior and self-destructive behavior.  Plan  Current plan is for TTS consultation today.    Tretha Sciara, MD 07/22/22 [phone]

## 2022-07-22 NOTE — ED Provider Notes (Signed)
Patient reportedly shizophrenic and acutely decompensated without taking medication. She has been medically cleared but not participating with TTS. Difficult to tell if it is behavioral or psychiatric. Throughout the night she kept leaving her room, banging her head with her hands. Ultimately she eloped from the emergency department and had to be brought back by authorities, she was subsequently IVC'ed and sedated for same.   CRITICAL CARE Performed by: Merrily Pew Total critical care time: 35 minutes Critical care time was exclusive of separately billable procedures and treating other patients. Critical care was necessary to treat or prevent imminent or life-threatening deterioration. Critical care was time spent personally by me on the following activities: development of treatment plan with patient and/or surrogate as well as nursing, discussions with consultants, evaluation of patient's response to treatment, examination of patient, obtaining history from patient or surrogate, ordering and performing treatments and interventions, ordering and review of laboratory studies, ordering and review of radiographic studies, pulse oximetry and re-evaluation of patient's condition.    Kemper Hochman, Corene Cornea, MD 07/22/22 [phone]

## 2022-07-22 NOTE — Progress Notes (Signed)
Inpatient Behavioral Health Placement  Pt meets inpatient criteria per [name], FNP. There are no available beds at [facility] per [facility] Lavell Luster, RN. Referral was sent to the following facilities;    Destination Service Provider Address Phone Fax  [facility]  [place], [place] [address] [place]  [facility]  [address] [place] [address] [phone]  [facility]  [place], [place] [address] [phone]  [facility]  [place] [address] [place] [address] [place]  [facility]  [address] [place] [address] [phone]  [place] [address], [place] [address] [phone]  [facility]  [place] [address] [phone]  [facility]  [address] [place] [address] [phone] [place]  [address] [place] [address] [phone] [facility]  [address] [place] [address] [phone]  [facility]  [address] [place] [address] [phone]   Situation ongoing,  CSW will follow up.   Benjaman Kindler, MSW, LCSWA 07/22/2022  @ 11:19 PM

## 2022-07-23 ENCOUNTER — Encounter (HOSPITAL_COMMUNITY): Payer: Self-pay | Admitting: Psychiatry

## 2022-07-23 ENCOUNTER — Inpatient Hospital Stay (HOSPITAL_COMMUNITY)
Admission: AD | Admit: 2022-07-23 | Discharge: 2022-07-31 | DRG: 885 | Disposition: A | Discharge status: Home / Self Care | Payer: Federal, State, Local not specified - Other | Attending: Psychiatry | Admitting: Psychiatry

## 2022-07-23 ENCOUNTER — Other Ambulatory Visit: Payer: Self-pay | Admitting: Psychiatry

## 2022-07-23 DIAGNOSIS — F209 Schizophrenia, unspecified: Principal | ICD-10-CM | POA: Diagnosis present

## 2022-07-23 DIAGNOSIS — Z79899 Other long term (current) drug therapy: Secondary | ICD-10-CM

## 2022-07-23 DIAGNOSIS — F419 Anxiety disorder, unspecified: Secondary | ICD-10-CM | POA: Diagnosis present

## 2022-07-23 DIAGNOSIS — F1521 Other stimulant dependence, in remission: Secondary | ICD-10-CM | POA: Diagnosis present

## 2022-07-23 DIAGNOSIS — Z9152 Personal history of nonsuicidal self-harm: Secondary | ICD-10-CM

## 2022-07-23 DIAGNOSIS — Z20822 Contact with and (suspected) exposure to covid-19: Secondary | ICD-10-CM | POA: Diagnosis present

## 2022-07-23 DIAGNOSIS — F17200 Nicotine dependence, unspecified, uncomplicated: Secondary | ICD-10-CM | POA: Diagnosis present

## 2022-07-23 DIAGNOSIS — Z560 Unemployment, unspecified: Secondary | ICD-10-CM | POA: Diagnosis not present

## 2022-07-23 DIAGNOSIS — F200 Paranoid schizophrenia: Secondary | ICD-10-CM | POA: Diagnosis present

## 2022-07-23 DIAGNOSIS — F17210 Nicotine dependence, cigarettes, uncomplicated: Secondary | ICD-10-CM | POA: Diagnosis present

## 2022-07-23 DIAGNOSIS — F29 Unspecified psychosis not due to a substance or known physiological condition: Secondary | ICD-10-CM | POA: Diagnosis present

## 2022-07-23 DIAGNOSIS — Z91148 Patient's other noncompliance with medication regimen for other reason: Secondary | ICD-10-CM

## 2022-07-23 LAB — RAPID URINE DRUG SCREEN, HOSP PERFORMED
Amphetamines: NOT DETECTED
Barbiturates: NOT DETECTED
Benzodiazepines: NOT DETECTED
Cocaine: NOT DETECTED
Opiates: NOT DETECTED
Tetrahydrocannabinol: NOT DETECTED

## 2022-07-23 LAB — PREGNANCY, URINE: Preg Test, Ur: NEGATIVE

## 2022-07-23 MED ORDER — BENZTROPINE MESYLATE 1 MG PO TABS: 1.0000 mg | ORAL_TABLET | Freq: Two times a day (BID) | ORAL | Status: DC

## 2022-07-23 MED ORDER — TRAZODONE HCL 50 MG PO TABS
50.0000 mg | ORAL_TABLET | Freq: Every evening | ORAL | Status: DC | PRN
  Administered 2022-07-24 – 2022-07-29 (×7): 50 mg via ORAL
  Filled 2022-07-23 (×7): qty 1

## 2022-07-23 MED ORDER — BENZTROPINE MESYLATE 0.5 MG PO TABS
0.5000 mg | ORAL_TABLET | Freq: Two times a day (BID) | ORAL | Status: DC
  Administered 2022-07-23 – 2022-07-24 (×2): 0.5 mg via ORAL
  Filled 2022-07-23 (×7): qty 1

## 2022-07-23 MED ORDER — LORAZEPAM 1 MG PO TABS
1.0000 mg | ORAL_TABLET | ORAL | Status: AC | PRN
  Administered 2022-07-25: 1 mg via ORAL
  Filled 2022-07-23: qty 1

## 2022-07-23 MED ORDER — MAGNESIUM HYDROXIDE 400 MG/5ML PO SUSP: 30.0000 mL | Freq: Every day | ORAL | Status: DC | PRN

## 2022-07-23 MED ORDER — ALUM & MAG HYDROXIDE-SIMETH 200-200-20 MG/5ML PO SUSP: 30.0000 mL | ORAL | Status: DC | PRN

## 2022-07-23 MED ORDER — ACETAMINOPHEN 325 MG PO TABS
650.0000 mg | ORAL_TABLET | Freq: Four times a day (QID) | ORAL | Status: DC | PRN
  Administered 2022-07-23: 650 mg via ORAL
  Filled 2022-07-23: qty 2

## 2022-07-23 MED ORDER — RISPERIDONE 2 MG PO TBDP: 2.0000 mg | ORAL_TABLET | Freq: Three times a day (TID) | ORAL | Status: DC | PRN

## 2022-07-23 MED ORDER — HALOPERIDOL 1 MG PO TABS
1.0000 mg | ORAL_TABLET | Freq: Two times a day (BID) | ORAL | Status: DC
  Administered 2022-07-23 – 2022-07-24 (×2): 1 mg via ORAL
  Filled 2022-07-23 (×6): qty 1

## 2022-07-23 MED ORDER — SERTRALINE HCL 50 MG PO TABS
50.0000 mg | ORAL_TABLET | Freq: Every day | ORAL | Status: DC
  Administered 2022-07-24: 50 mg via ORAL
  Filled 2022-07-23 (×5): qty 1

## 2022-07-23 MED ORDER — ACETAMINOPHEN 325 MG PO TABS
650.0000 mg | ORAL_TABLET | Freq: Four times a day (QID) | ORAL | Status: DC | PRN
  Administered 2022-07-23 – 2022-07-29 (×6): 650 mg via ORAL
  Filled 2022-07-23 (×6): qty 2

## 2022-07-23 MED ORDER — ZIPRASIDONE MESYLATE 20 MG IM SOLR: 20.0000 mg | INTRAMUSCULAR | Status: DC | PRN

## 2022-07-23 MED ORDER — HYDROXYZINE HCL 25 MG PO TABS
25.0000 mg | ORAL_TABLET | Freq: Three times a day (TID) | ORAL | Status: DC | PRN
  Administered 2022-07-24 – 2022-07-28 (×5): 25 mg via ORAL
  Filled 2022-07-23 (×6): qty 1

## 2022-07-23 NOTE — ED Notes (Signed)
Breakfast tray given to pt 

## 2022-07-23 NOTE — Progress Notes (Signed)
Adult Psychoeducational Group Note  Date:  07/23/2022 Time:  9:54 PM  Group Topic/Focus:  Wrap-Up Group:   The focus of this group is to help patients review their daily goal of treatment and discuss progress on daily workbooks.  Participation Level:  Did Not Attend  Participation Quality:  Did Not Attend  Affect:  Did Not Attend  Cognitive:  Did Not Attend  Insight: Did Not Attend  Engagement in Group:  Did Not Attend  Modes of Intervention:  Did Not Attend  Additional Comments:   Pt was encouraged but refused to attend group discussion  Gerhard Perches 07/23/2022, 9:54 PM

## 2022-07-23 NOTE — ED Notes (Signed)
Lunch tray given to pt.

## 2022-07-23 NOTE — ED Notes (Signed)
Patient states she could not eat her lunch and sandwich given r/t no teeth.

## 2022-07-23 NOTE — Progress Notes (Signed)
Pt was accepted to [facility] 07/23/22; Bed Assignment 508-2  Pt meets inpatient criteria per Garrison [place], FNP  Attending Physician will be Dr. Leverne Humbles  Report can be called to: Adult unit: [phone]  Pt can arrive after 3:00pm  Care Team notified: Oris Drone, RN, Dennison Mascot, Luna Fuse, RN, [place]arrison [place], [place], [place] 07/23/2022 @ 2:32 PM

## 2022-07-23 NOTE — Tx Team (Signed)
Initial Treatment Plan 07/23/2022 5:58 PM Emily [name] [id number]    PATIENT STRESSORS: Medication change or noncompliance     PATIENT STRENGTHS: Average or above average intelligence  Motivation for treatment/growth  Physical Health  Supportive family/friends    PATIENT IDENTIFIED PROBLEMS: Psychosis    "Mental health"    " Want to get my meds right"             DISCHARGE CRITERIA:  Improved stabilization in mood, thinking, and/or behavior Reduction of life-threatening or endangering symptoms to within safe limits Verbal commitment to aftercare and medication compliance  PRELIMINARY DISCHARGE PLAN: Outpatient therapy Placement in alternative living arrangements  PATIENT/FAMILY INVOLVEMENT: This treatment plan has been presented to and reviewed with the patient, Emily [name], .  The patient has been given the opportunity to ask questions and make suggestions.  [place] Cera, RN 07/23/2022, 5:58 PM

## 2022-07-23 NOTE — Progress Notes (Signed)
Patient is a 36 year old female admitted under IVC from [facility] for erratic, bizarre behavior, paranoid- decompensated in the context of med non-compliance. Pt has a reported hx of schizophrenia and substance abuse. Pt currently denies alcohol and illicit substance use. Patient presented disheveled with sad affect, but calm, cooperative behavior- minimal speech during admission interview and assessment. VS monitored and recorded. Skin check performed. Belongings searched and secured in locker.  Patient was oriented to unit and schedule. Admission paperwork completed and signed. Pt denied SI/HI/AVH at this time. ( Pt does endorse having A/VH prior to admission.) PO fluids provided. Q 15 min checks initiated for safety.

## 2022-07-24 ENCOUNTER — Encounter (HOSPITAL_COMMUNITY): Payer: Self-pay

## 2022-07-24 ENCOUNTER — Encounter (HOSPITAL_COMMUNITY): Payer: Self-pay | Admitting: Psychiatry

## 2022-07-24 DIAGNOSIS — F209 Schizophrenia, unspecified: Secondary | ICD-10-CM

## 2022-07-24 LAB — CBC
HCT: 34.2 % — ABNORMAL LOW (ref 36.0–46.0)
Hemoglobin: 11.7 g/dL — ABNORMAL LOW (ref 12.0–15.0)
MCH: 31.9 pg (ref 26.0–34.0)
MCHC: 34.2 g/dL (ref 30.0–36.0)
MCV: 93.2 fL (ref 80.0–100.0)
Platelets: 239 10*3/uL (ref 150–400)
RBC: 3.67 MIL/uL — ABNORMAL LOW (ref 3.87–5.11)
RDW: 13.4 % (ref 11.5–15.5)
WBC: 4.5 10*3/uL (ref 4.0–10.5)
nRBC: 0 % (ref 0.0–0.2)

## 2022-07-24 LAB — COMPREHENSIVE METABOLIC PANEL
ALT: 27 U/L (ref 0–44)
AST: 27 U/L (ref 15–41)
Albumin: 3.3 g/dL — ABNORMAL LOW (ref 3.5–5.0)
Alkaline Phosphatase: 25 U/L — ABNORMAL LOW (ref 38–126)
Anion gap: 5 (ref 5–15)
BUN: 12 mg/dL (ref 6–20)
CO2: 24 mmol/L (ref 22–32)
Calcium: 8.4 mg/dL — ABNORMAL LOW (ref 8.9–10.3)
Chloride: 108 mmol/L (ref 98–111)
Creatinine, Ser: 0.63 mg/dL (ref 0.44–1.00)
GFR, Estimated: >60 mL/min (ref >60)
Glucose, Bld: 103 mg/dL — ABNORMAL HIGH (ref 70–99)
Potassium: 3.5 mmol/L (ref 3.5–5.1)
Sodium: 137 mmol/L (ref 135–145)
Total Bilirubin: 0.6 mg/dL (ref 0.3–1.2)
Total Protein: 6 g/dL — ABNORMAL LOW (ref 6.5–8.1)

## 2022-07-24 LAB — LIPID PANEL
Cholesterol: 159 mg/dL (ref 0–200)
HDL: 56 mg/dL (ref >40)
LDL Cholesterol: 93 mg/dL (ref 0–99)
Total CHOL/HDL Ratio: 2.8 RATIO
Triglycerides: 51 mg/dL (ref <150)
VLDL: 10 mg/dL (ref 0–40)

## 2022-07-24 LAB — HEMOGLOBIN A1C
Hgb A1c MFr Bld: 4.3 % — ABNORMAL LOW (ref 4.8–5.6)
Mean Plasma Glucose: 76.71 mg/dL

## 2022-07-24 LAB — TSH: TSH: 1.365 u[IU]/mL (ref 0.350–4.500)

## 2022-07-24 MED ORDER — HALOPERIDOL 5 MG PO TABS
2.5000 mg | ORAL_TABLET | Freq: Two times a day (BID) | ORAL | Status: DC
  Filled 2022-07-24 (×2): qty 0.5

## 2022-07-24 MED ORDER — BENZTROPINE MESYLATE 0.5 MG PO TABS: 0.5000 mg | ORAL_TABLET | Freq: Two times a day (BID) | ORAL | Status: DC | PRN

## 2022-07-24 MED ORDER — HALOPERIDOL 5 MG PO TABS
2.5000 mg | ORAL_TABLET | Freq: Two times a day (BID) | ORAL | Status: DC
  Administered 2022-07-24 – 2022-07-25 (×2): 2.5 mg via ORAL
  Filled 2022-07-24 (×4): qty 0.5
  Filled 2022-07-24: qty 1

## 2022-07-24 NOTE — Progress Notes (Signed)
D: Pt denied SI/HI/VH this morning. Pt endorsed AH in the form of "voices speaking softly in the background". Pt reports that the voices are not commanding her to do anything or speaking negatively to her at this time. Pt rated her depression a 8/10, anxiety a 4/10, and feelings of hopelessness a 0/10. Pt has been calm throughout the shift. Pt has been pacing the hall occasionally during shift but has not participated in groups or interacted on the unit throughout the day.   A: EKG completed. RN provided support and encouragement to patient. Pt given scheduled medications as prescribed. Q15 min checks verified for safety.    R: Patient verbally contracts for safety. Patient compliant with medications and treatment plan. Pt is safe on the unit.   07/24/22 0800  Psych Admission Type (Psych Patients Only)  Admission Status Involuntary  Psychosocial Assessment  Patient Complaints None  Eye Contact Fair  Facial Expression Anxious  Affect Anxious  Speech Slow  Interaction Assertive  Motor Activity Fidgety  Appearance/Hygiene Disheveled  Behavior Characteristics Appropriate to situation;Cooperative  Mood Depressed;Anxious;Sad  Thought Process  Coherency Blocking  Content Preoccupation  Delusions None reported or observed  Perception WDL  Hallucination Auditory (Pt reports hearing "voices in the background")  Judgment Impaired  Confusion Mild  Danger to Self  Current suicidal ideation? Denies  Agreement Not to Harm Self Yes  Description of Agreement Verbal  Danger to Others  Danger to Others None reported or observed

## 2022-07-24 NOTE — Group Note (Signed)
Recreation Therapy Group Note   Group Topic:Self-Esteem  Group Date: 07/24/2022 Start Time: 1000 End Time: 2979 Facilitators: Jerae Izard-McCall, LRT,CTRS Location: 500 Hall Dayroom   Goal Area(s) Addresses:  Patient will identify positive stress management techniques. Patient will identify benefits of using stress management post d/c.   Group Description: Meditation.  LRT played a meditation that focused getting renewed for a new day.  It also reinforced the value of positive self talk and keeping a positive attitude.  Patients were to sit comfortable and focus on the meditation as it played to fully engage, relax and clear their minds of any distractions.    Affect/Mood: Appropriate   Participation Level: Active   Participation Quality: Independent   Behavior: Appropriate   Speech/Thought Process: Focused   Insight: Good   Judgement: Good   Modes of Intervention: Art and Music   Patient Response to Interventions:  Engaged   Education Outcome:  Acknowledges education and In group clarification offered    Clinical Observations/Individualized Feedback: Pt engaged with the exercises and dances with some encouragement from staff.  Pt was bright and smiling.  Pt did not participate in the art portion of group because she felt she wasn't good at it.     Plan: Continue to engage patient in RT group sessions 2-3x/week.   Emily [name], LRT,CTRS 07/24/2022 12:49 PM

## 2022-07-24 NOTE — BHH Counselor (Signed)
Adult Comprehensive Assessment  Patient ID: Emily [name], female   DOB: 07/25/1986, 36 y.o.   MRN: [id number]  Information Source: Information source: Patient  Current Stressors:  Patient states their primary concerns and needs for treatment are:: During assessment, patient states she has been "feeling down and out." Patient states their goals for this hospitilization and ongoing recovery are:: States her goal for hospitalization is to "get back on my meds" Educational / Learning stressors: none reported Employment / Job issues: patient is unemployed Family Relationships: none reported Surveyor, quantity / Lack of resources (include bankruptcy): states "other people help me out" Housing / Lack of housing: none reported Physical health (include injuries & life threatening diseases): none reported Social relationships: patient denies current substance use Substance abuse: none reported Bereavement / Loss: reports death of mother 2 years ago  Living/Environment/Situation:  Living Arrangements: Other (Comment) (friend) Living conditions (as described by patient or guardian): states living conditions are WNL Who else lives in the home?: patient lives with friend How long has patient lived in current situation?: 1 year  Family History:  Marital status: Single Are you sexually active?: Yes What is your sexual orientation?: Heterosexual Has your sexual activity been affected by drugs, alcohol, medication, or emotional stress?: none reported Does patient have children?: Yes How many children?: 2 How is patient's relationship with their children?: 22 and 43 y/o (in custody of paternal grandparents); describes relationship as "good" but does not see them often  Childhood History:  By whom was/is the patient raised?: Mother Additional childhood history information: no relationship w/ father Description of patient's relationship with caregiver when they were a child: describes relationship with  mother during childhood as "good, nothing seemed bad." Patient's description of current relationship with people who raised him/her: mother is deceased How were you disciplined when you got in trouble as a child/adolescent?: forced time out Does patient have siblings?: Yes Number of Siblings: 1 Description of patient's current relationship with siblings: Pt reports "it's like they're not there" Did patient suffer any verbal/emotional/physical/sexual abuse as a child?: No Did patient suffer from severe childhood neglect?: No Has patient ever been sexually abused/assaulted/raped as an adolescent or adult?: No Was the patient ever a victim of a crime or a disaster?: No Witnessed domestic violence?: No Has patient been affected by domestic violence as an adult?: No  Education:  Highest grade of school patient has completed: HS Diploma Currently a student?: No Learning disability?: No  Employment/Work Situation:   Employment Situation: Unemployed (unkown duration, patient has applied for disability.) Has Patient ever Been in the [employer]?: No  Financial Resources:   Surveyor, quantity resources: No income Does patient have a Lawyer or guardian?: No  Alcohol/Substance Abuse:   Social History   Substance and Sexual Activity  Alcohol Use Not Currently   Comment: 2-3 drinks but none recently per pt report   Social History   Substance and Sexual Activity  Drug Use Not Currently   Types: Methamphetamines   Comment: "SEVERAL MONTHS AGO"   Tobacco Use: High Risk (07/24/2022)   Patient History    Smoking Tobacco Use: Every Day    Smokeless Tobacco Use: Never    Passive Exposure: Past   If attempted suicide, did drugs/alcohol play a role in this?:  (n/a) Alcohol/Substance Abuse Treatment Hx: Denies past history Has alcohol/substance abuse ever caused legal problems?: Yes (2012 possesion)  Social Support System:   Patient's Community Support System: Good Describe Community  Support System: lists "friends as supportive" though  unabl eto provide details Type of faith/religion: none How does patient's faith help to cope with current illness?: n/a  Leisure/Recreation:   Do You Have Hobbies?: Yes Leisure and Hobbies: like playing baseball, volleyball  Strengths/Needs:   Patient states these barriers may affect/interfere with their treatment: none reported Patient states these barriers may affect their return to the community: none reported Other important information patient would like considered in planning for their treatment: none reported  Discharge Plan:   Currently receiving community mental health services: No Does patient have access to transportation?: No Does patient have financial barriers related to discharge medications?: Yes (no listed insurance) Will patient be returning to same living situation after discharge?: No (states she plans on staying with "Gene" (good friend))  Summary/Recommendations:   Summary and Recommendations (to be completed by the evaluator): 36 y/o female w/ dx of Schizophrenia, undiffertiated from [place] w/ no listed insurance admitted due to disorganized thought/behaviors; duration of symptoms unknown/unclear.  During assessment, patient states she has been "feeling down and out." Patient is a poor historian indecisive/unclear about answer. Per chart review, patient missing from family for the past two years and randomly showed up with aggressive behaviors. When asked about her disappearance, patient acknowleged her absence but refused/unable to provide details. States her goal for hospitalization is to "get back on my meds."   Patient presents as calm, cooperative, and polite albeit guarded with information. Affect is paranoid, congruent with mood and context. Appearance is slightly neglected. Speech volume was low, speed was WNL, and content was limited. Exhibited some impairment to memory or dificulty understanding  questions; concentration was WNL. Patient oriented to person, place, time; not to situation. Currently denies SI, HI, AVH.    Patient is not connected with any outpatient mental health services; has DECLINED consent for CSW to make referrals at this time. However, patient seemed to be agreeable with outpatient services, although paranoia seemed to get in the the way of signing consent. Therapeutic recommendations include further crisis stabilization, medication management, group therapy, and case management.     Durenda Hurt. 07/24/2022

## 2022-07-24 NOTE — BHH Suicide Risk Assessment (Signed)
[facility] INPATIENT:  Family/Significant Other Suicide Prevention Education  Suicide Prevention Education:  Patient Refusal for Family/Significant Other Suicide Prevention Education: The patient Emily [name] has refused to provide written consent for family/significant other to be provided Family/Significant Other Suicide Prevention Education during admission and/or prior to discharge.  Physician notified.  CSW completed SPE with patient. Discussed potential triggers leading to suicidal ideation in addition to coping skills one might use in order to delay and distract self from self harming behaviors. CSW encouraged patient to utilize emergency services if they felt unable to maintain their safety. SPE flyer provided to patient at this time.   Durenda Hurt 07/24/2022, 11:22 AM

## 2022-07-24 NOTE — BH IP Treatment Plan (Signed)
Interdisciplinary Treatment and Diagnostic Plan Update  07/24/2022 Time of Session: 1000 Emily [name] MRN: [id number]  Principal Diagnosis: Schizophrenia [facility])  Secondary Diagnoses: Principal Problem:   Schizophrenia (HCC)   Current Medications:  Current Facility-Administered Medications  Medication Dose Route Frequency Provider Last Rate Last Admin   acetaminophen (TYLENOL) tablet 650 mg  650 mg Oral Q6H PRN Cecilie Lowers, FNP   650 mg at 07/24/22 0631   alum & mag hydroxide-simeth (MAALOX/MYLANTA) 200-200-20 MG/5ML suspension 30 mL  30 mL Oral Q4H PRN Ntuen, Jesusita Oka, FNP       benztropine (COGENTIN) tablet 0.5 mg  0.5 mg Oral BID Cecilie Lowers, FNP   0.5 mg at 07/24/22 9528   haloperidol (HALDOL) tablet 1 mg  1 mg Oral BID Cecilie Lowers, FNP   1 mg at 07/24/22 4132   hydrOXYzine (ATARAX) tablet 25 mg  25 mg Oral TID PRN Cecilie Lowers, FNP   25 mg at 07/24/22 0240   risperiDONE (RISPERDAL M-TABS) disintegrating tablet 2 mg  2 mg Oral Q8H PRN Ntuen, Jesusita Oka, FNP       And   LORazepam (ATIVAN) tablet 1 mg  1 mg Oral PRN Ntuen, Jesusita Oka, FNP       And   ziprasidone (GEODON) injection 20 mg  20 mg Intramuscular PRN Ntuen, Jesusita Oka, FNP       magnesium hydroxide (MILK OF MAGNESIA) suspension 30 mL  30 mL Oral Daily PRN Ntuen, Jesusita Oka, FNP       sertraline (ZOLOFT) tablet 50 mg  50 mg Oral Daily Ntuen, Tina C, FNP   50 mg at 07/24/22 0733   traZODone (DESYREL) tablet 50 mg  50 mg Oral QHS PRN Ntuen, Jesusita Oka, FNP   50 mg at 07/24/22 0240   PTA Medications: No medications prior to admission.    Patient Stressors: Medication change or noncompliance    Patient Strengths: Average or above average intelligence  Motivation for treatment/growth  Physical Health  Supportive family/friends   Treatment Modalities: Medication Management, Group therapy, Case management,  1 to 1 session with clinician, Psychoeducation, Recreational therapy.   Physician Treatment Plan for Primary Diagnosis:  Schizophrenia (HCC) Long Term Goal(s):     Short Term Goals:    Medication Management: Evaluate patient's response, side effects, and tolerance of medication regimen.  Therapeutic Interventions: 1 to 1 sessions, Unit Group sessions and Medication administration.  Evaluation of Outcomes: Progressing  Physician Treatment Plan for Secondary Diagnosis: Principal Problem:   Schizophrenia (HCC)  Long Term Goal(s):     Short Term Goals:       Medication Management: Evaluate patient's response, side effects, and tolerance of medication regimen.  Therapeutic Interventions: 1 to 1 sessions, Unit Group sessions and Medication administration.  Evaluation of Outcomes: Progressing   RN Treatment Plan for Primary Diagnosis: Schizophrenia (HCC) Long Term Goal(s): Knowledge of disease and therapeutic regimen to maintain health will improve  Short Term Goals: Ability to remain free from injury will improve, Ability to verbalize frustration and anger appropriately will improve, Ability to demonstrate self-control, Ability to participate in decision making will improve, Ability to verbalize feelings will improve, Ability to disclose and discuss suicidal ideas, Ability to identify and develop effective coping behaviors will improve, and Compliance with prescribed medications will improve  Medication Management: RN will administer medications as ordered by provider, will assess and evaluate patient's response and provide education to patient for prescribed medication. RN will report any adverse and/or side  effects to prescribing provider.  Therapeutic Interventions: 1 on 1 counseling sessions, Psychoeducation, Medication administration, Evaluate responses to treatment, Monitor vital signs and CBGs as ordered, Perform/monitor CIWA, COWS, AIMS and Fall Risk screenings as ordered, Perform wound care treatments as ordered.  Evaluation of Outcomes: Progressing   LCSW Treatment Plan for Primary Diagnosis:  Schizophrenia ([place]) Long Term Goal(s): Safe transition to appropriate next level of care at discharge, Engage patient in therapeutic group addressing interpersonal concerns.  Short Term Goals: Engage patient in aftercare planning with referrals and resources, Increase social support, Increase ability to appropriately verbalize feelings, Increase emotional regulation, Facilitate acceptance of mental health diagnosis and concerns, Facilitate patient progression through stages of change regarding substance use diagnoses and concerns, Identify triggers associated with mental health/substance abuse issues, and Increase skills for wellness and recovery  Therapeutic Interventions: Assess for all discharge needs, 1 to 1 time with Social worker, Explore available resources and support systems, Assess for adequacy in community support network, Educate family and significant other(s) on suicide prevention, Complete Psychosocial Assessment, Interpersonal group therapy.  Evaluation of Outcomes: Progressing   Progress in Treatment: Attending groups: No. Participating in groups: No. Taking medication as prescribed: Yes. Toleration medication: Yes. Family/Significant other contact made: No, will contact:  patient declined consent for CSW to reach family/friend Patient understands diagnosis: No. Discussing patient identified problems/goals with staff: Yes. Medical problems stabilized or resolved: Yes. Denies suicidal/homicidal ideation: No. Issues/concerns per patient self-inventory: Yes. Other: none   New problem(s) identified: No, Describe:  none  New Short Term/Long Term Goal(s): Patient to work towards elimination of symptoms of psychosis, medication management for mood stabilization; development of comprehensive mental wellness plan.  Patient Goals:  Patient states their goal for treatment is to "feel better and get back on my medications"  Discharge Plan or Barriers: No psychosocial barriers  identified at this time, patient to return to place of residence when appropriate for discharge.   Reason for Continuation of Hospitalization: Medication stabilization Other; describe psychosis   Estimated Length of Stay:  Last 3 [place] Suicide Severity Risk Score: [place] Admission (Current) from 07/23/2022 in [place] [facility] ED from 07/21/2022 in [facility] ED from 02/09/2022 in [place] No Risk No Risk No Risk       Last PHQ 2/9 Scores:    02/10/2022    2:36 PM  Depression screen PHQ 2/9  Decreased Interest 1  Down, Depressed, Hopeless 1  PHQ - 2 Score 2  Altered sleeping 1  Tired, decreased energy 1  Change in appetite 1  Feeling bad or failure about yourself  2  Trouble concentrating 1  Moving slowly or fidgety/restless 1  Suicidal thoughts 1  PHQ-9 Score 10    Scribe for Treatment Team: Larose Kells 07/24/2022 11:23 AM

## 2022-07-24 NOTE — Progress Notes (Signed)
Adult Psychoeducational Group Note  Date:  07/24/2022 Time:  8:36 PM  Group Topic/Focus:  Wrap-Up Group:   The focus of this group is to help patients review their daily goal of treatment and discuss progress on daily workbooks.  Participation Level:  Did Not Attend  Participation Quality:  Did Not Attend  Affect:  Did Not Attend  Cognitive:  Did Not Attend  Insight: Did Not Attend  Engagement in Group:  Did Not Attend  Modes of Intervention:  Did Not Attend  Additional Comments:   Pt was encouraged but refused to attend group discussion.   Gerhard Perches 07/24/2022, 8:36 PM

## 2022-07-24 NOTE — H&P (Addendum)
Psychiatric Admission Assessment Adult  Patient Identification: Emily [name] MRN:  [id number] Date of Evaluation:  07/24/2022 Chief Complaint:  Schizophrenia ([place]) [F20.9] Principal Diagnosis: Schizophrenia ([place]) Diagnosis:  Principal Problem:   Schizophrenia ([place])  Total Time spent with patient: 20 minutes  History of Present Illness: Emily [name] is a 36 year old female with past psychiatric history of schizophrenia, methamphetamine induced mood disorder, cannabis use disorder, [name] use disorder, and amphetamine use disorder who presented involuntarily from [facility] ED or psychosis and acute decompensation in the setting of medication noncompliance.  On Chart Review: Patient with multiple inpatient psychiatric admissions and multiple ED visits for substance induced psychosis including aggressive, bizarre behaviors.  Current Outpatient (Home) Medication List:  None listed Verified with [facility]- [phone] Olanzapine 5 mg, picked up one time 11/12/21 Xanax 0.14m prescription 03/25/22, never picked up  On Evaluation Today: Patient seen at bedside for treatment team evaluation.  She reports that her goal for this admission is to "feel better and get my medications back on track."  At the end of the evaluation, patient appears internally preoccupied, facing toward the wall and smiling as though smiling at someone.  When this writer attempts to reassess, patient declines to speak with me.  After reminding patient that her goal was to work on medications while admitted, patient agrees and still declines to speak with me at this time.  On afternoon reassessment with attending, patient is a poor historian and reports that she presented from her sister's home after having racing thoughts, bizarre behaviors, and uncertainty of "what was going on."  She declines our team to speak with her sister, as she is not speaking to her at this time.  Over the past couple of weeks, patient  endorses poor sleep (approximately 20 hours over the course of the week), good energy, "okay" mood, and intact appetite.  She denies anxiety, as well as a history of trauma accompanied by nightmares and flashbacks.  To our team, she denies AVH, although she did report auditory hallucinations to RN this morning.  When this is brought to her attention, she says "then maybe."  She denies paranoia, but states that sometimes she believes that others can control her thoughts when looking at her.  She denies receiving special messages.  She denies SI and HI as well.    ED course: Per AForestine NaEDP: Emily [name] JMirandois a 36year old Caucasian female with reportedly shizophrenic and acutely decompensated without taking medication. She has been medically cleared but not participating with TTS. Difficult to tell if it is behavioral or psychiatric. Throughout the night she kept leaving her room, banging her head with her hands. Ultimately she eloped from the emergency department and had to be brought back by authorities, she was subsequently IVC'ed and sedated for same.   Collateral Information: Per consult-patient's daughter Emily [name] [phone] with patient permission for more information.  Patient daughter states that patient was not missing for 2 years as reported, however was living with her friend.  When condition becomes unbearable there, patient returned to her sister who is unable to provide care for patient at this time.  Daughter reports patient presents with bizarre behavior, disorganized, paranoid, unable to carry out ADLs, and exhibiting thought blocking, and refusing to talk occasionally.  Daughter reports that she cannot care for her mother at this time since she works full-time to support herself.    POA/Legal Guardian: Denies  Past Psychiatric Hx: Previous Psych Diagnoses: Schizophrenia, schizoaffective disorder bipolar  type, substance-induced mood disorder, opioid dependence, Prior  inpatient treatment: [facility] July 2022, [facility] October 2017, [facility] October 2015 and November 2009 Prior outpatient treatment: History of suicide: Per prior documentation, history of cutting History of homicide: None documented Psychiatric medication history: Zyprexa 5 mg, gabapentin 100 mg twice daily, hydroxyzine 25 mg 3 times daily as needed anxiety Psychiatric medication compliance history: Noncompliant Neuromodulation history: None documented Current Psychiatrist: None documented Current therapist: None documented  Substance Abuse Hx: Per LCSW assessment Alcohol: Denies current use Tobacco: 1 PPD Illicit drugs: Denies current use Rx drug abuse: Denies current use Rehab hx:  Past Medical History: Medical Diagnoses: Hepatitis C Home Rx: None documented Prior Hosp: None documented Prior Surgeries/Trauma: Unspecified procedure on her legs Head trauma, LOC, concussions, seizures: Denies Allergies: Bee venom-hives LMP: 1 to 2 months ago Contraception: Denies PCP: None documented  Family History:  Medical, psychiatric: Documentation reveals none per patient's daughter   Social History: Per LCSW assessment Childhood: Raised by mother, no relationship with father.  Has 1 sibling with whom she does not currently have relationship Abuse: Denies Marital Status: Single Sexual orientation: Heterosexual Children: "36 and 54 y/o (in custody of paternal grandparents); describes relationship as 'good' but does not see them often" Employment: Unemployed Education: [place]: Living with a friend Finances: The patient is dependent on others; applied for disability Legal: 2012-possession Military: Denies  Is the patient at risk to self? Yes.    Has the patient been a risk to self in the past 6 months? Yes.    Has the patient been a risk to self within the distant past? Yes.    Is the patient a risk to others? No.  Has the patient been a risk to others in the past 6  months? No.  Has the patient been a risk to others within the distant past? No.    Alcohol Screening:  1. How often do you have a drink containing alcohol?: Never 2. How many drinks containing alcohol do you have on a typical day when you are drinking?: 1 or 2 3. How often do you have six or more drinks on one occasion?: Never AUDIT-C Score: 0 4. How often during the last year have you found that you were not able to stop drinking once you had started?: Never 5. How often during the last year have you failed to do what was normally expected from you because of drinking?: Never 6. How often during the last year have you needed a first drink in the morning to get yourself going after a heavy drinking session?: Never 7. How often during the last year have you had a feeling of guilt of remorse after drinking?: Never 8. How often during the last year have you been unable to remember what happened the night before because you had been drinking?: Never 9. Have you or someone else been injured as a result of your drinking?: No 10. Has a relative or friend or a doctor or another health worker been concerned about your drinking or suggested you cut down?: No Alcohol Use Disorder Identification Test Final Score (AUDIT): 0 Substance Abuse History in the last 12 months:  Yes.   Consequences of Substance Abuse: Medical Consequences:  Multiple psychiatric admissions; bizarre behaviors Legal Consequences:  Possession charge in 2012 Previous Psychotropic Medications: Yes  Psychological Evaluations: Yes  Past Medical History:  Past Medical History:  Diagnosis Date   Anxiety    Anxiety    Depression  Schizophrenia ([place])    History reviewed. No pertinent surgical history. Family History: History reviewed. No pertinent family history.  Tobacco Screening:   Social History:  Social History   Substance and Sexual Activity  Alcohol Use Not Currently   Comment: 2-3 drinks but none recently per pt  report     Social History   Substance and Sexual Activity  Drug Use Not Currently   Types: Methamphetamines   Comment: "SEVERAL MONTHS AGO"    Additional Social History:  Allergies:   Allergies  Allergen Reactions   Bee Venom Hives   Lab Results:  Results for orders placed or performed during the hospital encounter of 07/23/22 (from the past 48 hour(s))  Pregnancy, urine     Status: None   Collection Time: 07/23/22  5:04 PM  Result Value Ref Range   Preg Test, Ur NEGATIVE NEGATIVE    Comment:        THE SENSITIVITY OF THIS METHODOLOGY IS >20 mIU/mL. Performed at [facility], [place] [address] [place], [place] [address]   Rapid urine drug screen (hospital performed) not at [facility]     Status: None   Collection Time: 07/23/22  5:04 PM  Result Value Ref Range   Opiates NONE DETECTED NONE DETECTED   Cocaine NONE DETECTED NONE DETECTED   Benzodiazepines NONE DETECTED NONE DETECTED   Amphetamines NONE DETECTED NONE DETECTED   Tetrahydrocannabinol NONE DETECTED NONE DETECTED   Barbiturates NONE DETECTED NONE DETECTED    Comment: (NOTE) DRUG SCREEN FOR MEDICAL PURPOSES ONLY.  IF CONFIRMATION IS NEEDED FOR ANY PURPOSE, NOTIFY LAB WITHIN 5 DAYS.  LOWEST DETECTABLE LIMITS FOR URINE DRUG SCREEN Drug Class                     Cutoff (ng/mL) Amphetamine and metabolites    1000 Barbiturate and metabolites    200 Benzodiazepine                 200 Opiates and metabolites        300 Cocaine and metabolites        300 THC                            50 Performed at [facility], [place] [address] [place], [place] [address]   CBC     Status: Abnormal   Collection Time: 07/24/22  6:41 AM  Result Value Ref Range   WBC 4.5 4.0 - 10.5 K/uL   RBC 3.67 (L) 3.87 - 5.11 MIL/uL   Hemoglobin 11.7 (L) 12.0 - 15.0 g/dL   HCT 34.2 (L) 36.0 - 46.0 %   MCV 93.2 80.0 - 100.0 fL   MCH 31.9 26.0 - 34.0 pg   MCHC 34.2 30.0 - 36.0 g/dL   RDW 13.4 11.5 - 15.5 %    Platelets 239 150 - 400 K/uL   nRBC 0.0 0.0 - 0.2 %    Comment: Performed at [facility], [place] [address], [place], [place] [address]  Comprehensive metabolic panel     Status: Abnormal   Collection Time: 07/24/22  6:41 AM  Result Value Ref Range   Sodium 137 135 - 145 mmol/L   Potassium 3.5 3.5 - 5.1 mmol/L   Chloride 108 98 - 111 mmol/L   CO2 24 22 - 32 mmol/L   Glucose, Bld 103 (H) 70 - 99 mg/dL    Comment: Glucose reference range  applies only to samples taken after fasting for at least 8 hours.   BUN 12 6 - 20 mg/dL   Creatinine, Ser 0.63 0.44 - 1.00 mg/dL   Calcium 8.4 (L) 8.9 - 10.3 mg/dL   Total Protein 6.0 (L) 6.5 - 8.1 g/dL   Albumin 3.3 (L) 3.5 - 5.0 g/dL   AST 27 15 - 41 U/L   ALT 27 0 - 44 U/L   Alkaline Phosphatase 25 (L) 38 - 126 U/L   Total Bilirubin 0.6 0.3 - 1.2 mg/dL   GFR, Estimated >60 >60 mL/min    Comment: (NOTE) Calculated using the CKD-EPI Creatinine Equation (2021)    Anion gap 5 5 - 15    Comment: Performed at [facility], [place] [address] [place], [place] [address]  Hemoglobin A1c     Status: Abnormal   Collection Time: 07/24/22  6:41 AM  Result Value Ref Range   Hgb A1c MFr Bld 4.3 (L) 4.8 - 5.6 %    Comment: (NOTE) Pre diabetes:          5.7%-6.4%  Diabetes:              >6.4%  Glycemic control for   <7.0% adults with diabetes    Mean Plasma Glucose 76.71 mg/dL    Comment: Performed at [facility] [address] [place], [place] [address]  Lipid panel     Status: None   Collection Time: 07/24/22  6:41 AM  Result Value Ref Range   Cholesterol 159 0 - 200 mg/dL   Triglycerides 51 <150 mg/dL   HDL 56 >40 mg/dL   Total CHOL/HDL Ratio 2.8 RATIO   VLDL 10 0 - 40 mg/dL   LDL Cholesterol 93 0 - 99 mg/dL    Comment:        Total Cholesterol/HDL:CHD Risk Coronary Heart Disease Risk Table                     Men   Women  1/2 Average Risk   3.4   3.3  Average Risk       5.0   4.4  2 X Average  Risk   9.6   7.1  3 X Average Risk  23.4   11.0        Use the calculated Patient Ratio above and the CHD Risk Table to determine the patient's CHD Risk.        ATP III CLASSIFICATION (LDL):  <100     mg/dL   Optimal  100-129  mg/dL   Near or Above                    Optimal  130-159  mg/dL   Borderline  160-189  mg/dL   High  >190     mg/dL   Very High Performed at [facility] [address], [place], [place] [address]   TSH     Status: None   Collection Time: 07/24/22  6:41 AM  Result Value Ref Range   TSH 1.365 0.350 - 4.500 uIU/mL    Comment: Performed by a 3rd Generation assay with a functional sensitivity of <=0.01 uIU/mL. Performed at [facility], [place] [address] [place], [place] [address]     Blood Alcohol level:  Lab Results  Component Value Date   [facility] <10 07/21/2022   ETH <10 98/92/1194    Metabolic Disorder Labs:  Lab Results  Component  Value Date   HGBA1C 4.3 (L) 07/24/2022   MPG 76.71 07/24/2022   MPG 82.45 04/04/2021   Lab Results  Component Value Date   PROLACTIN 3.5 (L) 04/02/2021   Lab Results  Component Value Date   CHOL 159 07/24/2022   TRIG 51 07/24/2022   HDL 56 07/24/2022   CHOLHDL 2.8 07/24/2022   VLDL 10 07/24/2022   LDLCALC 93 07/24/2022   LDLCALC 104 (H) 04/04/2021    Current Medications: Current Facility-Administered Medications  Medication Dose Route Frequency Provider Last Rate Last Admin   acetaminophen (TYLENOL) tablet 650 mg  650 mg Oral Q6H PRN Ntuen, Kris Hartmann, FNP   650 mg at 07/24/22 0631   alum & mag hydroxide-simeth (MAALOX/MYLANTA) 200-200-20 MG/5ML suspension 30 mL  30 mL Oral Q4H PRN Ntuen, Kris Hartmann, FNP       benztropine (COGENTIN) tablet 0.5 mg  0.5 mg Oral BID PRN Rosezetta Schlatter, MD       haloperidol (HALDOL) tablet 2.5 mg  2.5 mg Oral BID Rosezetta Schlatter, MD       hydrOXYzine (ATARAX) tablet 25 mg  25 mg Oral TID PRN [name], FNP   25 mg at 07/24/22 0240   risperiDONE  (RISPERDAL M-TABS) disintegrating tablet 2 mg  2 mg Oral Q8H PRN Ntuen, Kris Hartmann, FNP       And   LORazepam (ATIVAN) tablet 1 mg  1 mg Oral PRN Ntuen, Kris Hartmann, FNP       And   ziprasidone (GEODON) injection 20 mg  20 mg Intramuscular PRN Ntuen, Kris Hartmann, FNP       magnesium hydroxide (MILK OF MAGNESIA) suspension 30 mL  30 mL Oral Daily PRN Ntuen, Kris Hartmann, FNP       sertraline (ZOLOFT) tablet 50 mg  50 mg Oral Daily Ntuen, Tina C, FNP   50 mg at 07/24/22 0733   traZODone (DESYREL) tablet 50 mg  50 mg Oral QHS PRN [name], FNP   50 mg at 07/24/22 0240   PTA Medications: No medications prior to admission.    Musculoskeletal: Strength & Muscle Tone: within normal limits Gait & Station: normal Patient leans: N/A    Psychiatric Specialty Exam:  Presentation  General Appearance: Casual; Bizarre; Disheveled (Appears much older than stated age; absence of teeth)    Eye Contact:Fleeting    Speech:Clear and Coherent; Normal Rate    Speech Volume:Normal    Handedness:Right    Mood and Affect  Mood:-- (Okay)    Affect:Constricted     Thought Process  Thought Processes:Goal Directed    Duration of Psychotic Symptoms: Greater than six months   Past Diagnosis of Schizophrenia or Psychoactive disorder: Yes   Descriptions of Associations:Intact    Orientation:Partial (States name, date, and location.  Does not know her age (responds 78))    Thought Content:Illogical; Delusions    Hallucinations:Hallucinations: Auditory Description of Auditory Hallucinations: Reported to RN positive auditory hallucinations, noncommand in nature    Ideas of Reference:None    Suicidal Thoughts:Suicidal Thoughts: No    Homicidal Thoughts:Homicidal Thoughts: No     Sensorium  Memory:Immediate Poor; Recent Poor    Judgment:Impaired    Insight:Poor     Executive Functions  Concentration:Fair    Attention Span:Fair    [place]  of Knowledge:Poor    Language:Poor     Psychomotor Activity  Psychomotor Activity:Psychomotor Activity: Normal     Assets  Assets:Communication Skills; Desire for Improvement; Physical Health;  Social Support     Sleep  Sleep:Sleep: Fair      Physical Exam: Physical Exam Vitals reviewed.  Constitutional:      General: She is not in acute distress.    Appearance: She is normal weight.     Comments: Appears much older than stated age  HENT:     Head: Normocephalic and atraumatic.     Mouth/Throat:     Mouth: Mucous membranes are moist.     Pharynx: Oropharynx is clear.     Comments: Absent teeth Pulmonary:     Effort: Pulmonary effort is normal.  Skin:    General: Skin is warm and dry.  Neurological:     Motor: No weakness.     Gait: Gait normal.    Review of Systems  Unable to perform ROS: Psychiatric disorder   Blood pressure 90/68, pulse 96, temperature 98 F (36.7 C), temperature source Oral, resp. rate 16, height _0  (1.6 m), weight 53.1 kg, SpO2 99 %. Body mass index is 20.73 kg/m.   ASSESSMENT: Principal Problem:   Schizophrenia ([place])    [place] day 1.   Treatment Plan Summary: Daily contact with patient to assess and evaluate symptoms and progress in treatment and Medication management  Physician Treatment Plan for Primary Diagnosis: Principal Problem:   Schizophrenia ([place]) Long Term Goal(s): Improvement in symptoms so as ready for discharge  Short Term Goals: Ability to identify changes in lifestyle to reduce recurrence of condition will improve, Ability to verbalize feelings will improve, Ability to disclose and discuss suicidal ideas, Ability to demonstrate self-control will improve, Ability to identify and develop effective coping behaviors will improve, Ability to maintain clinical measurements within normal limits will improve, Compliance with prescribed medications will improve, and Ability to identify triggers associated with  substance abuse/mental health issues will improve    I certify that inpatient services furnished can reasonably be expected to improve the patient's condition.    Assessment:  Diagnoses / Active Problems:  Safety and Monitoring: INVOLUTARILY ([place]) admission to inpatient psychiatric unit for safety, stabilization and treatment Daily contact with patient to assess and evaluate symptoms and progress in treatment Patient's case to be discussed in multi-disciplinary team meeting Observation Level : q15 minute checks Vital signs: q12 hours Precautions: suicide, elopement, and assault  2. Psychiatric Diagnoses and Treatment # Schizophrenia versus schizoaffective disorder bipolar type - Increase to Haldol 2.5 mg twice daily, to begin tonight - Continue previously prescribed Zoloft 50 mg daily - Adjusted to Cogentin 0.5 mg twice daily as needed  PRN: Hydroxyzine 25 mg 3 times daily for anxiety, trazodone 50 mg nightly for sleep, agitation protocol: Geodon 20 mg, Risperdal 2 mg, and Ativan 1 mg  -- The risks/benefits/side-effects/alternatives to this medication were discussed in detail with the patient and time was given for questions. The patient consents to medication trial.              -- Metabolic profile and EKG monitoring obtained while on an atypical antipsychotic  BMI: 20.73 TSH: 1.365 Lipid Panel: WNL HbgA1c: 4.3% QTc: Pending             -- Encouraged patient to participate in unit milieu and in scheduled group therapies     3. Medical Issues Being Addressed: #History of hepatitis C ALT, AST, alk phos all WNL -Recommend outpatient follow-up  4. Discharge Planning:              -- Social work and case management  to assist with discharge planning and identification of hospital follow-up needs prior to discharge             -- Estimated LOS: 5-7 days             -- Discharge Concerns: Need to establish a safety plan; Medication compliance and effectiveness              -- Discharge Goals: Return home with outpatient referrals for mental health follow-up including medication management/psychotherapy    Rosezetta Schlatter, MD 10/30/20233:50 PM

## 2022-07-24 NOTE — Plan of Care (Signed)
  Problem: Education: Goal: Emotional status will improve Outcome: Progressing Goal: Mental status will improve Outcome: Progressing   Problem: Activity: Goal: Sleeping patterns will improve Outcome: Progressing   Problem: Health Behavior/Discharge Planning: Goal: Compliance with prescribed medication regimen will improve Outcome: Progressing

## 2022-07-24 NOTE — Progress Notes (Signed)
D- Patient alert and oriented. Denies SI, HI, AVH, and pain. Patient rates anxiety as a 6/10 and depression as a 3/10.  A- Scheduled medications administered to patient, per MAR. Support and encouragement provided.  Routine safety checks conducted every 15 minutes.  Patient informed to notify staff with problems or concerns.  R- No adverse drug reactions noted. Patient contracts for safety at this time. Patient compliant with medications and treatment plan. Patient receptive, calm, and cooperative. Patient remains safe at this time.

## 2022-07-24 NOTE — Group Note (Signed)
LCSW Group Therapy Note  Group Date: 07/24/2022 Start Time: 1300 End Time: 1400   Type of Therapy and Topic:  Group Therapy - How To Cope with Nervousness about Discharge   Participation Level:  Did Not Attend   Description of Group This process group involved identification of patients' feelings about discharge. Some of them are scheduled to be discharged soon, while others are new admissions, but each of them was asked to share thoughts and feelings surrounding discharge from the hospital. One common theme was that they are excited at the prospect of going home, while another was that many of them are apprehensive about sharing why they were hospitalized. Patients were given the opportunity to discuss these feelings with their peers in preparation for discharge.  Therapeutic Goals  Patient will identify their overall feelings about pending discharge. Patient will think about how they might proactively address issues that they believe will once again arise once they get home (i.e. with parents). Patients will participate in discussion about having hope for change.   Summary of Patient Progress:  Did not attend  Therapeutic Modalities Cognitive Behavioral Therapy   Zachery Conch, LCSW 07/24/2022  1:43 PM

## 2022-07-24 NOTE — Progress Notes (Signed)
Recreation Therapy Notes  INPATIENT RECREATION THERAPY ASSESSMENT  Patient Details Name: Emily [name] MRN: [id number] DOB: 1986/04/12 Today's Date: 07/24/2022       Information Obtained From: Patient  Able to Participate in Assessment/Interview: Yes  Patient Presentation: Alert  Reason for Admission (Per Patient): Other (Comments), Med Non-Compliance (Pt stated her reactions started up.)  Patient Stressors:  (None identified)  Coping Skills:   Journal, Exercise, Talk, Prayer, Read, Hot Bath/Shower  Leisure Interests (2+):  Individual - Reading  Frequency of Recreation/Participation: Other (Comment) (Sometimes)  Awareness of Community Resources:  No  Expressed Interest in [employer] Information: No  [place] of Residence:  [place]  Patient Main Form of Transportation: Other (Comment) (Friend)  Patient Strengths:  Fun person  Patient Identified Areas of Improvement:  None  Patient Goal for Hospitalization:  "stay clean, smile"  Current SI (including self-harm):  No  Current HI:  No  Current AVH: No  Staff Intervention Plan: Group Attendance, Collaborate with Interdisciplinary Treatment Team  Consent to Intern Participation: N/A   Kaylee Wombles-McCall, LRT,CTRS Julliette Frentz A Maddox Hlavaty-McCall 07/24/2022, 1:15 PM

## 2022-07-24 NOTE — Plan of Care (Signed)
  Problem: Education: Goal: Emotional status will improve Outcome: Progressing Goal: Mental status will improve Outcome: Progressing   

## 2022-07-24 NOTE — Consult Note (Signed)
Telepsych Consultation    Reason for Consult: Psych consult   Referring Physician: Dr. Alvino Chapel   Location of Patient: [facility] ED hospital   Location of Provider: [facility]   Patient Identification: Emily [name]   MRN:  [id number]   Principal Diagnosis: Altered mental state, history of schizophrenia and substance use.   Diagnosis: Schizophrenic and acute decompensation without taking meds    Total Time spent with patient: 45 minutes   Subjective:   Emily [name] is a 36 y.o. female patient admitted with reportedly shizophrenic and acutely decompensated on the context of not taking her psychotropic medications.  Patient speech is minimal with thought blocking.  Only able to state her name, does not remember her date of birth or situation surrounding her hospitalization.  Responds to yes or no questions only. Per chart review: Patient was seen twice in ED in the last 6 months for hallucinations.  She was admitted, treated and discharge from [facility] in July 2022, for suicidal ideation.  At that time patient was scheduled appointment at [facility] for therapy and medication management, and [place] for follow-up appointment for services for physical health concerns.  Daughter reports, she was not sure if patient follow-up with this appointment.   Collateral information: Patient's daughter Emily [name] at [phone] called with patient permission for more information.  Patient daughter states that patient was not missing for 2 years as reported, however was living with her friend.  When condition becomes unbearable there, patient returned to her sister who is unable to provide care for patient at this time.  Daughter reports patient presents with bizarre behavior, disorganized, paranoid, unable to carry out ADLs, and exhibiting thought blocking, and refusing to talk occasionally.  Daughter reports that she cannot care for her mother at this time  since she works full-time to support herself.   HPI: As per [facility] initial intake notes: Emily [name] is a 36 year old Caucasian female with reportedly shizophrenic and acutely decompensated without taking medication. She has been medically cleared but not participating with TTS. Difficult to tell if it is behavioral or psychiatric. Throughout the night she kept leaving her room, banging her head with her hands. Ultimately she eloped from the emergency department and had to be brought back by authorities, she was subsequently IVC'ed and sedated for same.    Past Psychiatric History: Altered mental state, amphetamine use disorder severe, cannabis use disorder severe dependence, depression, amphetamine induced mood disorder, and tobacco use disorder.   Risk to Self:  Yes Risk to Others:  No Prior Inpatient Therapy:  Yes Prior Outpatient Therapy:  Yes   Past Medical History:      Past Medical History:  Diagnosis Date   Anxiety     Anxiety     Depression     Schizophrenia ([place])     No past surgical history on file.   Family History: None indicated as per patient's daughter.   Family Psychiatric  History:    Social History:  Social History        Substance and Sexual Activity  Alcohol Use Yes    Comment: 2-3 drinks but none recently per pt report     Social History       Substance and Sexual Activity  Drug Use No    Social History         Socioeconomic History   Marital status: Single      Spouse name: Not on file  Number of children: Not on file   Years of education: Not on file   Highest education level: Not on file  Occupational History   Not on file  Tobacco Use   Smoking status: Every Day      Passive exposure: Past   Smokeless tobacco: Never  Substance and Sexual Activity   Alcohol use: Yes      Comment: 2-3 drinks but none recently per pt report   Drug use: No   Sexual activity: Not Currently  Other Topics Concern   Not on file  Social History  Narrative   Not on file    Social Determinants of Health    Financial Resource Strain: Not on file  Food Insecurity: Not on file  Transportation Needs: Not on file  Physical Activity: Not on file  Stress: Not on file  Social Connections: Not on file    Additional Social History:   Allergies:       Allergies  Allergen Reactions   Bee Venom Hives      Labs:  Lab Results Last 48 Hours        Results for orders placed or performed during the hospital encounter of 07/21/22 (from the past 48 hour(s))  CBC with Differential     Status: None    Collection Time: 07/21/22 11:36 AM  Result Value Ref Range    WBC 4.7 4.0 - 10.5 K/uL    RBC 4.00 3.87 - 5.11 MIL/uL    Hemoglobin 12.4 12.0 - 15.0 g/dL    HCT 37.4 36.0 - 46.0 %    MCV 93.5 80.0 - 100.0 fL    MCH 31.0 26.0 - 34.0 pg    MCHC 33.2 30.0 - 36.0 g/dL    RDW 13.2 11.5 - 15.5 %    Platelets 248 150 - 400 K/uL    nRBC 0.0 0.0 - 0.2 %    Neutrophils Relative % 55 %    Neutro Abs 2.6 1.7 - 7.7 K/uL    Lymphocytes Relative 32 %    Lymphs Abs 1.5 0.7 - 4.0 K/uL    Monocytes Relative 11 %    Monocytes Absolute 0.5 0.1 - 1.0 K/uL    Eosinophils Relative 1 %    Eosinophils Absolute 0.1 0.0 - 0.5 K/uL    Basophils Relative 1 %    Basophils Absolute 0.0 0.0 - 0.1 K/uL    Immature Granulocytes 0 %    Abs Immature Granulocytes 0.01 0.00 - 0.07 K/uL      Comment: Performed at [facility], [address] [place], [place] [address]  Comprehensive metabolic panel     Status: Abnormal    Collection Time: 07/21/22 11:36 AM  Result Value Ref Range    Sodium 137 135 - 145 mmol/L    Potassium 3.6 3.5 - 5.1 mmol/L    Chloride 106 98 - 111 mmol/L    CO2 24 22 - 32 mmol/L    Glucose, Bld 96 70 - 99 mg/dL      Comment: Glucose reference range applies only to samples taken after fasting for at least 8 hours.    BUN 6 6 - 20 mg/dL    Creatinine, Ser 0.58 0.44 - 1.00 mg/dL    Calcium 8.5 (L) 8.9 - 10.3 mg/dL    Total Protein 6.5 6.5 -  8.1 g/dL    Albumin 3.6 3.5 - 5.0 g/dL    AST 30 15 - 41 U/L    ALT 24 0 -  44 U/L    Alkaline Phosphatase 33 (L) 38 - 126 U/L    Total Bilirubin 0.5 0.3 - 1.2 mg/dL    GFR, Estimated >60 >60 mL/min      Comment: (NOTE) Calculated using the CKD-EPI Creatinine Equation (2021)      Anion gap 7 5 - 15      Comment: Performed at [facility], [address], [place], [place] [address]  Ethanol     Status: None    Collection Time: 07/21/22 11:36 AM  Result Value Ref Range    Alcohol, Ethyl (B) <10 <10 mg/dL      Comment: (NOTE) Lowest detectable limit for serum alcohol is 10 mg/dL.   For medical purposes only. Performed at [facility], [address] [place], [place] [address]    hCG, quantitative, pregnancy     Status: None    Collection Time: 07/21/22 11:36 AM  Result Value Ref Range    hCG, Beta Chain, Quant, S <1 <5 mIU/mL      Comment:          GEST. AGE      CONC.  (mIU/mL)   <=1 WEEK        5 - 50     2 WEEKS       50 - 500     3 WEEKS       100 - 10,000     4 WEEKS     1,000 - 30,000     5 WEEKS     3,500 - 115,000   6-8 WEEKS     12,000 - 270,000    12 WEEKS     15,000 - 220,000        FEMALE AND NON-PREGNANT FEMALE:     LESS THAN 5 mIU/mL Performed at [facility], [address] [place], [place] [address]    POC urine preg, ED (not at [facility])     Status: None    Collection Time: 07/21/22  1:18 PM  Result Value Ref Range    Preg Test, Ur NEGATIVE NEGATIVE      Comment:        THE SENSITIVITY OF THIS METHODOLOGY IS >24 mIU/mL    Rapid urine drug screen (hospital performed)     Status: None    Collection Time: 07/21/22  1:21 PM  Result Value Ref Range    Opiates NONE DETECTED NONE DETECTED    Cocaine NONE DETECTED NONE DETECTED    Benzodiazepines NONE DETECTED NONE DETECTED    Amphetamines NONE DETECTED NONE DETECTED    Tetrahydrocannabinol NONE DETECTED NONE DETECTED    Barbiturates NONE DETECTED NONE DETECTED      Comment: (NOTE) DRUG SCREEN FOR MEDICAL  PURPOSES ONLY.  IF CONFIRMATION IS NEEDED FOR ANY PURPOSE, NOTIFY LAB WITHIN 5 DAYS.   LOWEST DETECTABLE LIMITS FOR URINE DRUG SCREEN Drug Class                     Cutoff (ng/mL) Amphetamine and metabolites    1000 Barbiturate and metabolites    200 Benzodiazepine                 200 Opiates and metabolites        300 Cocaine and metabolites        300 THC                            50 Performed  at [facility], [address], [place], [place] [address]    Pregnancy, urine     Status: None    Collection Time: 07/21/22  1:22 PM  Result Value Ref Range    Preg Test, Ur NEGATIVE NEGATIVE      Comment:        THE SENSITIVITY OF THIS METHODOLOGY IS >20 mIU/mL. Performed at [facility], [address], [place], [place] [address]    Resp Panel by RT-PCR (Flu A&B, Covid) Anterior Nasal Swab     Status: None    Collection Time: 07/21/22  3:53 PM    Specimen: Anterior Nasal Swab  Result Value Ref Range    SARS Coronavirus 2 by RT PCR NEGATIVE NEGATIVE      Comment: (NOTE) SARS-CoV-2 target nucleic acids are NOT DETECTED.   The SARS-CoV-2 RNA is generally detectable in upper respiratory specimens during the acute phase of infection. The lowest concentration of SARS-CoV-2 viral copies this assay can detect is 138 copies/mL. A negative result does not preclude SARS-Cov-2 infection and should not be used as the sole basis for treatment or other patient management decisions. A negative result may occur with  improper specimen collection/handling, submission of specimen other than nasopharyngeal swab, presence of viral mutation(s) within the areas targeted by this assay, and inadequate number of viral copies(<138 copies/mL). A negative result must be combined with clinical observations, patient history, and epidemiological information. The expected result is Negative.   Fact Sheet for Patients:  EntrepreneurPulse.com.au   Fact Sheet for Healthcare Providers:   IncredibleEmployment.be   This test is no t yet approved or cleared by the [place] FDA and  has been authorized for detection and/or diagnosis of SARS-CoV-2 by FDA under an Emergency Use Authorization (EUA). This EUA will remain  in effect (meaning this test can be used) for the duration of the COVID-19 declaration under Section 564(b)(1) of the Act, 21 U.S.C.section 360bbb-3(b)(1), unless the authorization is terminated  or revoked sooner.           Influenza A by PCR NEGATIVE NEGATIVE    Influenza B by PCR NEGATIVE NEGATIVE      Comment: (NOTE) The Xpert Xpress SARS-CoV-2/FLU/RSV plus assay is intended as an aid in the diagnosis of influenza from Nasopharyngeal swab specimens and should not be used as a sole basis for treatment. Nasal washings and aspirates are unacceptable for Xpert Xpress SARS-CoV-2/FLU/RSV testing.   Fact Sheet for Patients: EntrepreneurPulse.com.au   Fact Sheet for Healthcare Providers: IncredibleEmployment.be   This test is not yet approved or cleared by the [place] FDA and has been authorized for detection and/or diagnosis of SARS-CoV-2 by FDA under an Emergency Use Authorization (EUA). This EUA will remain in effect (meaning this test can be used) for the duration of the COVID-19 declaration under Section 564(b)(1) of the Act, 21 U.S.C. section 360bbb-3(b)(1), unless the authorization is terminated or revoked.   Performed at [facility], [address] [place], [place] [address]          Medications:           Current Facility-Administered Medications  Medication Dose Route Frequency Provider Last Rate Last Admin   sodium chloride 0.9 % bolus 1,000 mL  1,000 mL Intravenous Once Milton Ferguson, MD        No current outpatient medications on file.    Musculoskeletal: Strength & Muscle Tone: within normal limits Gait & Station: normal Patient leans: N/A   Psychiatric  Specialty Exam:  Presentation  General Appearance:  Appropriate for Environment; Bizarre; Disheveled   Eye Contact: Fair   Speech: Blocked (Able to respond to yes or no questions with shaking her head.  Speech blocked and delayed.)   Speech Volume: Other (comment) (Refusing to communicate.)   Handedness: Right   Mood and Affect  Mood: Dysphoric; Anxious   Affect: Congruent   Thought Process  Thought Processes: Other (comment) (Unable to assess.)   Descriptions of Associations:Loose   Orientation:Partial (Able to state her name only.  Does not remember her date of birth, or what brought her to the hospital.)   Thought Content:Illogical; Paranoid Ideation; Scattered   History of Schizophrenia/Schizoaffective disorder:No (Patient daughter Emily [name] reports patient has been diagnosed with schizophrenia.  And exhibits bizarre behavior.)   Duration of Psychotic Symptoms:N/A   Hallucinations:Hallucinations: -- (Unable to assess due to blocked speech.  Patient daughter Emily [name] report patient has bizarre behavior and seeing people that are not there.)   Ideas of Reference:None   Suicidal Thoughts:Suicidal Thoughts: No   Homicidal Thoughts:Homicidal Thoughts: No   Sensorium  Memory: Immediate Poor; Recent Poor; Remote Poor   Judgment: Poor   Insight: Poor   Executive Functions  Concentration: Fair   Attention Span: Fair   Recall: Other (comment) (Able to assess)   Fund of Knowledge: Poor   Language: Fair   Psychomotor Activity  Psychomotor Activity: Psychomotor Activity: Increased; Restlessness (Restless hand movements.)   Assets  Assets: Communication Skills; Physical Health; Social Support   Sleep  Sleep: Sleep: Good Number of Hours of Sleep: 7    Review of Systems  Constitutional: Negative.  Negative for chills and fever.  HENT: Negative.  Negative for hearing loss and tinnitus.   Eyes: Negative.  Negative for blurred vision  and double vision.  Respiratory: Negative.  Negative for cough, sputum production, shortness of breath and wheezing.   Cardiovascular: Negative.  Negative for chest pain and palpitations.  Gastrointestinal: Negative.  Negative for heartburn, nausea and vomiting.  Genitourinary: Negative.  Negative for dysuria, frequency and urgency.  Musculoskeletal: Negative.  Negative for myalgias and neck pain.  Skin: Negative.  Negative for rash.  Neurological: Negative.  Negative for dizziness, tingling and headaches.  Endo/Heme/Allergies: Negative.  Negative for environmental allergies and polydipsia. Does not bruise/bleed easily.  Psychiatric/Behavioral:  Positive for depression and altered mental state.     Blood pressure 112/67, pulse 92, temperature 98.2 F (36.8 C), resp. rate 17, height 5\' 4"  (1.626 m), weight 49.9 kg, SpO2 100 %. Body mass index is 18.88 kg/m.   Treatment Plan Summary: Daily contact with patient to assess and evaluate symptoms and progress in treatment and Medication management.   Plan: -- Recommend inpatient psych placement.   Major depressive disorder: --Initiate sertraline 50 mg p.o. daily for depression   Psychosis: --Initiate Haloperidol 1 mg tablets p.o. 2 times daily, for psychosis -- Initiate Cogentin 0.5 mg p.o. 2 times daily, for EPS preventative symptoms.   Insomnia: --Initiate trazodone 50 mg p.o. q. Nightly   Anxiety: -- Initiate hydroxyzine 25 mg p.o. 3 times daily as needed   Agitation protocol: -- Risperidone disintegrating tabs 2 mg p.o. every 8 hours as needed for agitation and -- Lorazepam tablet 1 mg p.o. as needed for anxiety, and severe agitation for 1 dose and --Ziprasidone Geodon injection 20 mg IM as needed agitation for 1 dose.     Disposition: Recommend psychiatric Inpatient admission when medically cleared.  CSW to pursue appropriate inpatient placement options for  patient.   This service was provided via telemedicine using a 2-way,  interactive audio and video technology.   Names of all persons participating in this telemedicine service and their role in this encounter. Name: Sigmund Hazel Role: Patient  Name: Garrison Columbus, NP Role: Provider  Name: Dr. Alvino Chapel Role: [facility] ED physician  Name: Dr. Dwyane Dee Role: Medical Director      [name], FNP 07/22/2022 10:47 AM             Note Details  Author [name], FNP File Time 07/22/2022 12:12 PM  Author Type Family Nurse Practitioner Status Cosign Needed  Last Editor [name], [place] # [phone] Admit Date 07/23/2022

## 2022-07-24 NOTE — [facility] Suicide Risk Assessment (Signed)
Suicide Risk Assessment  Admission Assessment    [facility] Admission Suicide Risk Assessment   Nursing information obtained from:  Patient Demographic factors:  Caucasian, Unemployed, Low socioeconomic status Current Mental Status:  NA Loss Factors:  NA Historical Factors:  Family history of mental illness or substance abuse Risk Reduction Factors:  Positive social support  Total Time spent with patient: 20 minutes Principal Problem: Schizophrenia ([place]) Diagnosis:  Principal Problem:   Schizophrenia ([place])  Subjective Data: Emily [name] is a 36 year old female with past psychiatric history of schizophrenia, methamphetamine induced mood disorder, cannabis use disorder, [name] use disorder, and amphetamine use disorder who presented involuntarily from [facility] ED or psychosis and acute decompensation in the setting of medication noncompliance.  Continued Clinical Symptoms:  Alcohol Use Disorder Identification Test Final Score (AUDIT): 0 The "Alcohol Use Disorders Identification Test", Guidelines for Use in Primary Care, Second Edition.  World Pharmacologist [facility]). Score between 0-7:  no or low risk or alcohol related problems. Score between 8-15:  moderate risk of alcohol related problems. Score between 16-19:  high risk of alcohol related problems. Score 20 or above:  warrants further diagnostic evaluation for alcohol dependence and treatment.   CLINICAL FACTORS:   Schizophrenia:   Less than 7 years old Currently Psychotic Unstable or Poor Therapeutic Relationship Previous Psychiatric Diagnoses and Treatments   Musculoskeletal: Strength & Muscle Tone: within normal limits Gait & Station: normal Patient leans: N/A  Psychiatric Specialty Exam:  Presentation  General Appearance:  Casual; Bizarre; Disheveled (Appears much older than stated age; absence of teeth)  Eye Contact: Fleeting  Speech: Clear and Coherent; Normal Rate  Speech  Volume: Normal  Handedness: Right   Mood and Affect  Mood: -- (Okay)  Affect: Constricted   Thought Process  Thought Processes: Goal Directed  Descriptions of Associations:Intact  Orientation:Partial (States name, date, and location.  Does not know her age (responds 29))  Thought Content:Illogical; Delusions  History of Schizophrenia/Schizoaffective disorder:Yes  Duration of Psychotic Symptoms:Greater than six months  Hallucinations:Hallucinations: Auditory Description of Auditory Hallucinations: Reported to RN positive auditory hallucinations, noncommand in nature  Ideas of Reference:None  Suicidal Thoughts:Suicidal Thoughts: No  Homicidal Thoughts:Homicidal Thoughts: No   Sensorium  Memory: Immediate Poor; Recent Poor  Judgment: Impaired  Insight: Poor   Executive Functions  Concentration: Fair  Attention Span: Fair  Recall: [place] of Knowledge: Poor  Language: Poor   Psychomotor Activity  Psychomotor Activity: Psychomotor Activity: Normal   Assets  Assets: Communication Skills; Desire for Improvement; Physical Health; Social Support   Sleep  Sleep: Sleep: Fair    Physical Exam: Vitals reviewed.  Constitutional:      General: She is not in acute distress.    Appearance: She is normal weight.     Comments: Appears much older than stated age  HENT:     Head: Normocephalic and atraumatic.     Mouth/Throat:     Mouth: Mucous membranes are moist.     Pharynx: Oropharynx is clear.     Comments: Absent teeth Pulmonary:     Effort: Pulmonary effort is normal.  Skin:    General: Skin is warm and dry.  Neurological:     Motor: No weakness.     Gait: Gait normal.      Review of Systems  Unable to perform ROS: Psychiatric disorder  Blood pressure 90/68, pulse 96, temperature 98 F (36.7 C), temperature source Oral, resp. rate 16, height _0  (1.6 m), weight 53.1 kg, SpO2 99 %.  Body mass index is 20.73  kg/m.   COGNITIVE FEATURES THAT CONTRIBUTE TO RISK:  Loss of executive function    SUICIDE RISK:   Mild:  Suicidal ideation of limited frequency, intensity, duration, and specificity.  There are no identifiable plans, no associated intent, mild dysphoria and related symptoms, good self-control (both objective and subjective assessment), few other risk factors, and identifiable protective factors, including available and accessible social support.  PLAN OF CARE:  Safety and Monitoring: INVOLUTARILY (By [place]) admission to inpatient psychiatric unit for safety, stabilization and treatment Daily contact with patient to assess and evaluate symptoms and progress in treatment Patient's case to be discussed in multi-disciplinary team meeting Observation Level : q15 minute checks Vital signs: q12 hours Precautions: suicide, elopement, and assault   2. Psychiatric Diagnoses and Treatment # Schizophrenia versus schizoaffective disorder bipolar type - Increase to Haldol 2.5 mg twice daily, to begin tonight - Continue previously prescribed Zoloft 50 mg daily - Adjusted to Cogentin 0.5 mg twice daily as needed   PRN: Hydroxyzine 25 mg 3 times daily for anxiety, trazodone 50 mg nightly for sleep, agitation protocol: Geodon 20 mg, Risperdal 2 mg, and Ativan 1 mg   -- The risks/benefits/side-effects/alternatives to this medication were discussed in detail with the patient and time was given for questions. The patient consents to medication trial.              -- Metabolic profile and EKG monitoring obtained while on an atypical antipsychotic  BMI: 20.73 TSH: 1.365 Lipid Panel: WNL HbgA1c: 4.3% QTc: Pending             -- Encouraged patient to participate in unit milieu and in scheduled group therapies      3. Medical Issues Being Addressed: #History of hepatitis C ALT, AST, alk phos all WNL -Recommend outpatient follow-up   4. Discharge Planning:              -- Social work and  case management to assist with discharge planning and identification of hospital follow-up needs prior to discharge             -- Estimated LOS: 5-7 days             -- Discharge Concerns: Need to establish a safety plan; Medication compliance and effectiveness             -- Discharge Goals: Return home with outpatient referrals for mental health follow-up including medication management/psychotherapy  I certify that inpatient services furnished can reasonably be expected to improve the patient's condition.   Rosezetta Schlatter, MD 07/24/2022, 3:49 PM

## 2022-07-25 DIAGNOSIS — F200 Paranoid schizophrenia: Secondary | ICD-10-CM | POA: Diagnosis not present

## 2022-07-25 DIAGNOSIS — F1521 Other stimulant dependence, in remission: Secondary | ICD-10-CM | POA: Diagnosis present

## 2022-07-25 MED ORDER — BENZTROPINE MESYLATE 0.5 MG PO TABS: 0.5000 mg | ORAL_TABLET | Freq: Two times a day (BID) | ORAL | Status: DC | PRN

## 2022-07-25 MED ORDER — HALOPERIDOL 5 MG PO TABS
5.0000 mg | ORAL_TABLET | Freq: Two times a day (BID) | ORAL | Status: DC
  Administered 2022-07-25 – 2022-07-27 (×4): 5 mg via ORAL
  Filled 2022-07-25 (×6): qty 1

## 2022-07-25 MED ORDER — HALOPERIDOL 2 MG PO TABS
2.5000 mg | ORAL_TABLET | Freq: Once | ORAL | Status: AC
  Administered 2022-07-25: 2.5 mg via ORAL
  Filled 2022-07-25: qty 1

## 2022-07-25 NOTE — Progress Notes (Signed)
Pt observed pacing in hall majority of this shift. Denies SI, HI and AVH when assessed. However, pt noted to be preoccupied, whispering to self, jumping lines in hall with wide eyed while pacing in hall. Noted with blank affect, brief eye contact, sad mood, hyperactive, concrete and minimal on interactions. Received PRN Tylenol at 6468 for back pain 7/10 with desired effect. Pt also received PRN Vistaril 25 mg PO at 0818 without effect and Ativan 1 mg PO at 1436 for increased anxiety with desired effect when reassessed at 1530. Pt did attend scheduled groups, off unit to gym and meals, returned without issues. Safety checks maintained at Q 15 intervals. Emotional support, reassurance and encouragement offered to pt. All medications administered with verbal education and effects monitored. Pt tolerates meals, fluids and medications well without issues. Remains cooperative with care.

## 2022-07-25 NOTE — Group Note (Signed)
Recreation Therapy Group Note   Group Topic:Healthy Decision Making  Group Date: 07/25/2022 Start Time: 1000 End Time: 8850 Facilitators: Ianna Salmela-McCall, LRT,CTRS Location: 500 Hall Dayroom   Goal Area(s) Addresses:  Patient will effectively work with peer towards shared goal.  Patient will identify factors that guided their decision making.  Patient will pro-socially communicate ideas during group session.    Group Description:  Patients were given a scenario that they were going to be stranded on a deserted [place] for several months before being rescued. Writer tasked them with making a list of 15 things they would choose to bring with them for "survival". The list of items was prioritized most important to least. Each patient would come up with their own list, then work together to create a new list of 15 items while in a group of 3-5 peers. LRT discussed each person's list and how it differed from others. The debrief included discussion of priorities, good decisions versus bad decisions, and how it is important to think before acting so we can make the best decision possible. LRT tied the concept of effective communication among group members to patient's support systems outside of the hospital and its benefit post discharge.   Affect/Mood: Appropriate   Participation Level: None   Participation Quality: N/A   Behavior: N/A   Speech/Thought Process: N/A   Insight: N/A   Judgement: N/A   Modes of Intervention: Activity   Patient Response to Interventions:  N/A   Education Outcome:  N/A   Clinical Observations/Individualized Feedback: Pt stayed long enough to hear the instructions before leaving and not returning.    Plan: Continue to engage patient in RT group sessions 2-3x/week.   Amol Domanski-McCall, LRT,CTRS 07/25/2022 12:16 PM

## 2022-07-25 NOTE — Progress Notes (Signed)
   07/25/22 0537  Sleep  Number of Hours 7.5

## 2022-07-25 NOTE — Progress Notes (Signed)
[facility] MD Progress Note  07/25/2022 9:32 AM Emily [name]  MRN:  [id number]  Subjective:  Emily [name] is a 36 year old female with past psychiatric history of schizophrenia, methamphetamine induced mood disorder, cannabis use disorder, [name] use disorder, and amphetamine use disorder who presented involuntarily from [facility] ED for psychosis and acute decompensation in the setting of medication noncompliance.  Principal Problem: Schizophrenia ([place]) Diagnosis: Principal Problem:   Schizophrenia ([place]) Active Problems:   Tobacco use disorder   History of amphetamine dependence/abuse ([place])   Chart Review of Past 24 Hours: Per MAR, patient has been compliant with all scheduled medications. Patient did not attend groups yesterday but has not been a behavioral concern. Patient required the following behavioral PRNs: Hydroxyzine 25 mg and Trazodone 50 mg x 1.  Yesterday's Recommendations per Psychiatry Team: - Increase to Haldol 2.5 mg twice daily, to begin tonight - discontinue previously prescribed Zoloft 50 mg daily - Adjusted to Cogentin 0.5 mg twice daily as needed  On Evaluation Today: Patient is seen walking up and down the hallway.  She says that she prefers to talk while still walking. She is minimal in her answers, and answers some questions prior to the question being completed.  She reports sleeping well and intact appetite.  She denies adverse effects of medications.  She denies AVH, as well as SI, paranoia, and other first rank symptoms.  When asked about her auditory hallucinations yesterday, she says that she had them but refused to offer more information.  She denies somatic complaints.  Patient denies further acute concerns or complaints as well.    Total Time spent with patient: 20 minutes  Past Psychiatric History: See H&P  Past Medical History:  Past Medical History:  Diagnosis Date   Anxiety    Anxiety    Depression    Schizophrenia ([place])     History reviewed. No  pertinent surgical history. Family History: History reviewed. No pertinent family history. Family Psychiatric  History: See H&P Social History:  Social History   Substance and Sexual Activity  Alcohol Use Not Currently   Comment: 2-3 drinks but none recently per pt report     Social History   Substance and Sexual Activity  Drug Use Not Currently   Types: Methamphetamines   Comment: "SEVERAL MONTHS AGO"    Social History   Socioeconomic History   Marital status: Single    Spouse name: Not on file   Number of children: Not on file   Years of education: Not on file   Highest education level: Not on file  Occupational History   Not on file  Tobacco Use   Smoking status: Every Day    Passive exposure: Past   Smokeless tobacco: Never  Substance and Sexual Activity   Alcohol use: Not Currently    Comment: 2-3 drinks but none recently per pt report   Drug use: Not Currently    Types: Methamphetamines    Comment: "SEVERAL MONTHS AGO"   Sexual activity: Yes    Partners: Male  Other Topics Concern   Not on file  Social History Narrative   Not on file   Social Determinants of Health   Financial Resource Strain: Not on file  Food Insecurity: Not on file  Transportation Needs: Not on file  Physical Activity: Not on file  Stress: Not on file  Social Connections: Not on file   Additional Social History:           Sleep: Good  Appetite:  Good  Current Medications: Current Facility-Administered Medications  Medication Dose Route Frequency Provider Last Rate Last Admin   acetaminophen (TYLENOL) tablet 650 mg  650 mg Oral Q6H PRN [name], FNP   650 mg at 07/24/22 0631   alum & mag hydroxide-simeth (MAALOX/MYLANTA) 200-200-20 MG/5ML suspension 30 mL  30 mL Oral Q4H PRN Ntuen, Kris Hartmann, FNP       benztropine (COGENTIN) tablet 0.5 mg  0.5 mg Oral BID PRN Rosezetta Schlatter, MD       haloperidol (HALDOL) tablet 2.5 mg  2.5 mg Oral BID Massengill, Ovid Curd, MD   2.5 mg at  07/25/22 0818   hydrOXYzine (ATARAX) tablet 25 mg  25 mg Oral TID PRN [name], FNP   25 mg at 07/25/22 0818   risperiDONE (RISPERDAL M-TABS) disintegrating tablet 2 mg  2 mg Oral Q8H PRN Ntuen, Kris Hartmann, FNP       And   LORazepam (ATIVAN) tablet 1 mg  1 mg Oral PRN Ntuen, Kris Hartmann, FNP       And   ziprasidone (GEODON) injection 20 mg  20 mg Intramuscular PRN Ntuen, Kris Hartmann, FNP       magnesium hydroxide (MILK OF MAGNESIA) suspension 30 mL  30 mL Oral Daily PRN Ntuen, Kris Hartmann, FNP       traZODone (DESYREL) tablet 50 mg  50 mg Oral QHS PRN Ntuen, Kris Hartmann, FNP   50 mg at 07/24/22 2028    Lab Results:  Results for orders placed or performed during the hospital encounter of 07/23/22 (from the past 48 hour(s))  Pregnancy, urine     Status: None   Collection Time: 07/23/22  5:04 PM  Result Value Ref Range   Preg Test, Ur NEGATIVE NEGATIVE    Comment:        THE SENSITIVITY OF THIS METHODOLOGY IS >20 mIU/mL. Performed at [facility], [place] [address], [place], [place] [address]   Rapid urine drug screen (hospital performed) not at [facility]     Status: None   Collection Time: 07/23/22  5:04 PM  Result Value Ref Range   Opiates NONE DETECTED NONE DETECTED   Cocaine NONE DETECTED NONE DETECTED   Benzodiazepines NONE DETECTED NONE DETECTED   Amphetamines NONE DETECTED NONE DETECTED   Tetrahydrocannabinol NONE DETECTED NONE DETECTED   Barbiturates NONE DETECTED NONE DETECTED    Comment: (NOTE) DRUG SCREEN FOR MEDICAL PURPOSES ONLY.  IF CONFIRMATION IS NEEDED FOR ANY PURPOSE, NOTIFY LAB WITHIN 5 DAYS.  LOWEST DETECTABLE LIMITS FOR URINE DRUG SCREEN Drug Class                     Cutoff (ng/mL) Amphetamine and metabolites    1000 Barbiturate and metabolites    200 Benzodiazepine                 200 Opiates and metabolites        300 Cocaine and metabolites        300 THC                            50 Performed at [facility], [place] [address] [place], [place] [address]   CBC     Status: Abnormal   Collection Time: 07/24/22  6:41 AM  Result Value Ref Range   WBC 4.5 4.0 - 10.5 K/uL   RBC 3.67 (L) 3.87 - 5.11 MIL/uL  Hemoglobin 11.7 (L) 12.0 - 15.0 g/dL   HCT 34.2 (L) 36.0 - 46.0 %   MCV 93.2 80.0 - 100.0 fL   MCH 31.9 26.0 - 34.0 pg   MCHC 34.2 30.0 - 36.0 g/dL   RDW 13.4 11.5 - 15.5 %   Platelets 239 150 - 400 K/uL   nRBC 0.0 0.0 - 0.2 %    Comment: Performed at [facility], [place] [address], [place], [place] [address]  Comprehensive metabolic panel     Status: Abnormal   Collection Time: 07/24/22  6:41 AM  Result Value Ref Range   Sodium 137 135 - 145 mmol/L   Potassium 3.5 3.5 - 5.1 mmol/L   Chloride 108 98 - 111 mmol/L   CO2 24 22 - 32 mmol/L   Glucose, Bld 103 (H) 70 - 99 mg/dL    Comment: Glucose reference range applies only to samples taken after fasting for at least 8 hours.   BUN 12 6 - 20 mg/dL   Creatinine, Ser 0.63 0.44 - 1.00 mg/dL   Calcium 8.4 (L) 8.9 - 10.3 mg/dL   Total Protein 6.0 (L) 6.5 - 8.1 g/dL   Albumin 3.3 (L) 3.5 - 5.0 g/dL   AST 27 15 - 41 U/L   ALT 27 0 - 44 U/L   Alkaline Phosphatase 25 (L) 38 - 126 U/L   Total Bilirubin 0.6 0.3 - 1.2 mg/dL   GFR, Estimated >60 >60 mL/min    Comment: (NOTE) Calculated using the CKD-EPI Creatinine Equation (2021)    Anion gap 5 5 - 15    Comment: Performed at [facility], [place] [address], [place], [place] [address]  Hemoglobin A1c     Status: Abnormal   Collection Time: 07/24/22  6:41 AM  Result Value Ref Range   Hgb A1c MFr Bld 4.3 (L) 4.8 - 5.6 %    Comment: (NOTE) Pre diabetes:          5.7%-6.4%  Diabetes:              >6.4%  Glycemic control for   <7.0% adults with diabetes    Mean Plasma Glucose 76.71 mg/dL    Comment: Performed at [place] [address], [place], [place] [address]  Lipid panel     Status: None   Collection Time: 07/24/22  6:41 AM  Result Value Ref Range    Cholesterol 159 0 - 200 mg/dL   Triglycerides 51 <150 mg/dL   HDL 56 >40 mg/dL   Total CHOL/HDL Ratio 2.8 RATIO   VLDL 10 0 - 40 mg/dL   LDL Cholesterol 93 0 - 99 mg/dL    Comment:        Total Cholesterol/HDL:CHD Risk Coronary Heart Disease Risk Table                     Men   Women  1/2 Average Risk   3.4   3.3  Average Risk       5.0   4.4  2 X Average Risk   9.6   7.1  3 X Average Risk  23.4   11.0        Use the calculated Patient Ratio above and the CHD Risk Table to determine the patient's CHD Risk.        ATP III CLASSIFICATION (LDL):  <100     mg/dL   Optimal  100-129  mg/dL   Near or Above  Optimal  130-159  mg/dL   Borderline  160-189  mg/dL   High  >190     mg/dL   Very High Performed at [place] [address], [place], [place] [address]   TSH     Status: None   Collection Time: 07/24/22  6:41 AM  Result Value Ref Range   TSH 1.365 0.350 - 4.500 uIU/mL    Comment: Performed by a 3rd Generation assay with a functional sensitivity of <=0.01 uIU/mL. Performed at [facility], [place] [address], [place], [place] [address]     Blood Alcohol level:  Lab Results  Component Value Date   [facility] <10 07/21/2022   ETH <10 11/91/4782    Metabolic Disorder Labs: Lab Results  Component Value Date   HGBA1C 4.3 (L) 07/24/2022   MPG 76.71 07/24/2022   MPG 82.45 04/04/2021   Lab Results  Component Value Date   PROLACTIN 3.5 (L) 04/02/2021   Lab Results  Component Value Date   CHOL 159 07/24/2022   TRIG 51 07/24/2022   HDL 56 07/24/2022   CHOLHDL 2.8 07/24/2022   VLDL 10 07/24/2022   LDLCALC 93 07/24/2022   LDLCALC 104 (H) 04/04/2021    Physical Findings:  Musculoskeletal: Strength & Muscle Tone: within normal limits Gait & Station: normal Patient leans: N/A  Psychiatric Specialty Exam:  Presentation  General Appearance:  Casual; Bizarre; Disheveled (Appears much older than stated age; absence  of teeth)   Eye Contact: Fleeting   Speech: Clear and Coherent; Normal Rate   Speech Volume: Normal   Handedness: Right    Mood and Affect  Mood: -- (Okay)   Affect: Constricted    Thought Process  Thought Processes: Goal Directed   Descriptions of Associations:Intact   Orientation:Partial (States name, date, and location.  Does not know her age (responds 68))   Thought Content:Illogical; Delusions   History of Schizophrenia/Schizoaffective disorder:Yes   Duration of Psychotic Symptoms:Greater than six months   Hallucinations:Hallucinations: Auditory Description of Auditory Hallucinations: Reported to RN positive auditory hallucinations, noncommand in nature  Ideas of Reference:None   Suicidal Thoughts:Suicidal Thoughts: No  Homicidal Thoughts:Homicidal Thoughts: No   Sensorium  Memory: Immediate Poor; Recent Poor   Judgment: Impaired   Insight: Poor    Executive Functions  Concentration: Fair   Attention Span: Fair   Recall: [place] of Knowledge: Poor   Language: Poor    Psychomotor Activity  Psychomotor Activity: Psychomotor Activity: Normal   Assets  Assets: Communication Skills; Desire for Improvement; Physical Health; Social Support    Sleep  Sleep: Sleep: Fair    Physical Exam: Vitals reviewed.  Constitutional:      General: She is not in acute distress.    Appearance: She is normal weight.     Comments: Appears much older than stated age  HENT:     Head: Normocephalic and atraumatic.     Mouth/Throat:     Mouth: Mucous membranes are moist.     Pharynx: Oropharynx is clear.     Comments: Absent teeth Pulmonary:     Effort: Pulmonary effort is normal.  Skin:    General: Skin is warm and dry.  Neurological:     Motor: No weakness.     Gait: Gait normal.  Review of Systems  Constitutional:  Negative for malaise/fatigue.  Respiratory:  Negative for shortness of breath.    Cardiovascular:  Negative for chest pain.  Gastrointestinal: Negative.   Genitourinary: Negative.  Musculoskeletal:  Negative for myalgias.  Neurological:  Negative for headaches.   Blood pressure 113/77, pulse (!) 111, temperature 98.5 F (36.9 C), temperature source Oral, resp. rate 16, height _0  (1.6 m), weight 53.1 kg, SpO2 99 %. Body mass index is 20.73 kg/m.   Treatment Plan Summary: ASSESSMENT: Principal Problem:   Schizophrenia ([place]) Active Problems:   Tobacco use disorder   History of amphetamine dependence/abuse [facility])  Patient is a 36 year old female with history of schizophrenia who continues to present disorganized in thought process, reporting symptoms inconsistently to different providers.  She remains psychotic, but appears to be tolerating Haldol well.  Patient requires continued inpatient admission for crisis stabilization, medication optimization, and safety planning, as she has refused to provide consents to speak with family or friends thus far.      PLAN: Safety and Monitoring: INVOLUTARILY ([place]) admission to inpatient psychiatric unit for safety, stabilization and treatment Daily contact with patient to assess and evaluate symptoms and progress in treatment Patient's case to be discussed in multi-disciplinary team meeting Observation Level : q15 minute checks Vital signs: q12 hours Precautions: suicide, elopement, and assault   2. Psychiatric Diagnoses and Treatment # Schizophrenia versus schizoaffective disorder bipolar type - Increase to Haldol 5 mg twice daily, to begin today, with additional 2.5 mg given this afternoon. - Previously discontinued prescribed Zoloft 50 mg daily due to futility and concerns for bipolar disorder symptomology. - Continue Cogentin 0.5 mg twice daily as needed   PRN: Hydroxyzine 25 mg 3 times daily for anxiety, trazodone 50 mg nightly for sleep, agitation protocol: Geodon 20 mg, Risperdal 2 mg, and Ativan 1  mg   -- The risks/benefits/side-effects/alternatives to this medication were discussed in detail with the patient and time was given for questions. The patient consents to medication trial.              -- Metabolic profile and EKG monitoring obtained while on an atypical antipsychotic  BMI: 20.73 TSH: 1.365 Lipid Panel: WNL HbgA1c: 4.3% QTc: 462             -- Encouraged patient to participate in unit milieu and in scheduled group therapies      3. Medical Issues Being Addressed: #History of hepatitis C ALT, AST, alk phos all WNL -Recommend outpatient follow-up   4. Discharge Planning:              -- Social work and case management to assist with discharge planning and identification of hospital follow-up needs prior to discharge             -- Estimated LOS: 5-7 days             -- Discharge Concerns: Need to establish a safety plan; Medication compliance and effectiveness             -- Discharge Goals: Return home with outpatient referrals for mental health follow-up including medication management/psychotherapy    Rosezetta Schlatter, MD 07/25/2022, 9:32 AM

## 2022-07-25 NOTE — Plan of Care (Signed)
  Problem: Activity: Goal: Sleeping patterns will improve Outcome: Progressing   Problem: Health Behavior/Discharge Planning: Goal: Compliance with prescribed medication regimen will improve Outcome: Progressing   Problem: Coping: Goal: Ability to demonstrate self-control will improve Outcome: Not Progressing   Problem: Activity: Goal: Will verbalize the importance of balancing activity with adequate rest periods Outcome: Not Progressing   Problem: Education: Goal: Knowledge of the prescribed therapeutic regimen will improve Outcome: Not Progressing   Problem: Coping: Goal: Will verbalize feelings Outcome: Not Progressing   Problem: Role Relationship: Goal: Ability to interact with others will improve Outcome: Not Progressing

## 2022-07-26 MED ORDER — LORAZEPAM 1 MG PO TABS: 1.0000 mg | ORAL_TABLET | Freq: Four times a day (QID) | ORAL | Status: DC | PRN

## 2022-07-26 MED ORDER — LORAZEPAM 2 MG/ML IJ SOLN: 1.0000 mg | Freq: Four times a day (QID) | INTRAMUSCULAR | Status: DC | PRN

## 2022-07-26 NOTE — Group Note (Signed)
   Date/Time: 11/1 at 1pm  Type of Therapy and Topic:  Group Therapy:  triggers  Participation Level:  Did not attend  Description of Group:   Recognizing Triggers: Patients defined triggers and discussed the importance of recognizing their personal warning signs. Patients identified their own triggers and how they tend to cope with stressful situations. Patients discussed areas such as people, places, things, and thoughts that rigger certain emotions for them. CSW provided support to patients and discussed safety planning for when these triggers occur. Group participants had opportunities to share openly with the group and participate in a group discussion while providing support and feedback to their peers.  Therapeutic Goals: Patient will identify triggers that are contributing to a problem in their life Patient will identify unwanted behaviors and feelings associated with a trigger.  Patient will share with other group members strategies to confront and avoid triggers so that they may be able to react appropriately to triggers in daily life.    Summary of Patient Progress:   Did not attend     Therapeutic Modalities:   Cognitive Behavioral Therapy Solution Focused Therapy Motivational [place], LCSW, [facility]

## 2022-07-26 NOTE — Progress Notes (Signed)
Pt attended orientation group as scheduled. Observed with logical, soft speech, brief eye contact and flat affect. Pt contributed appropriately to group discussion but is minimal on interactions.

## 2022-07-26 NOTE — Progress Notes (Signed)
D- Patient alert and oriented. Patient was seen in her room and refused to come out despite requests. Patient responded minimally to questions, grunting for most answers. Patient was irritable during assessment and showed signs and symptoms of anxiety with her irritability. Patient did not attend this evening's wrap up group.   A- Scheduled medications administered to patient, per MAR. PRN trazadone for sleep and hydroxyzine for anxiety administered. Support and encouragement provided.  Routine safety checks conducted every 15 minutes.  Patient informed to notify staff with problems or concerns.  R- No adverse drug reactions noted. Patient contracts for safety at this time. Patient compliant with medications and treatment plan. Patient remains safe at this time.

## 2022-07-26 NOTE — Progress Notes (Signed)
   07/26/22 0538  Sleep  Number of Hours 9.5

## 2022-07-26 NOTE — Progress Notes (Signed)
   07/26/22 2245  Psych Admission Type (Psych Patients Only)  Admission Status Involuntary  Psychosocial Assessment  Patient Complaints Anxiety  Eye Contact Brief  Facial Expression Flat  Affect Depressed  Speech Logical/coherent  Interaction Cautious  Motor Activity Fidgety  Appearance/Hygiene Improved  Behavior Characteristics Cooperative  Mood Anxious  Aggressive Behavior  Effect No apparent injury  Thought Process  Coherency Concrete thinking  Content WDL  Delusions None reported or observed  Perception Hallucinations  Hallucination Auditory  Judgment Impaired  Confusion None  Danger to Self  Current suicidal ideation? Denies  Danger to Others  Danger to Others None reported or observed

## 2022-07-26 NOTE — Group Note (Signed)
Recreation Therapy Group Note   Group Topic:Problem Solving  Group Date: 07/26/2022 Start Time: 1005 End Time: 1038 Facilitators: Quention Mcneill-McCall, LRT,CTRS Location: 500 Hall Dayroom   Goal Area(s) Addresses:  Patient will effectively work with peer towards shared goal.  Patient will identify skills used to make activity successful.  Patient will share challenges and verbalize solution-driven approaches used. Patient will identify how skills used during activity can be used to reach post d/c goals.   Group Description: [employer]. Patients were provided the following materials: 5 drinking straws, 5 rubber bands, 5 paper clips, 2 index cards and 2 drinking cups.  Using the provided materials patients were asked to build a launching mechanism to launch a ping pong ball across the room, approximately 10 feet. Patients were divided into teams of 3-5. Instructions required all materials be incorporated into the device, functionality of items left to the peer group's discretion.   Affect/Mood: N/A   Participation Level: Did not attend    Clinical Observations/Individualized Feedback:     Plan: Continue to engage patient in RT group sessions 2-3x/week.   Niti Leisure-McCall, LRT,CTRS 07/26/2022 11:54 AM

## 2022-07-26 NOTE — Progress Notes (Addendum)
[facility] MD Progress Note  07/26/2022 3:04 PM Emily [name]  MRN:  [id number]  Subjective:  Emily [name] is a 36 year old female with past psychiatric history of schizophrenia, methamphetamine induced mood disorder, cannabis use disorder, nicotine use disorder, and amphetamine use disorder who presented involuntarily from [facility] ED for psychosis and acute decompensation in the setting of medication noncompliance.  Principal Problem: Schizophrenia ([place]) Diagnosis: Principal Problem:   Schizophrenia ([place]) Active Problems:   Tobacco use disorder   History of amphetamine dependence/abuse ([place])   Chart Review of Past 24 Hours: Per MAR, patient has been compliant with all scheduled medications. Patient did not attend groups yesterday but has not been a behavioral concern. Patient required the following behavioral PRNs: Ativan x1 due to pacing and responding to internal stimuli   On Evaluation Today:  Patient seen and assessed at bedside with Dr. Nance Pew.  Patient seems more calm today than yesterday and less disorganized.  Patient was very soft-spoken and responses were usually one-word.  Patient continues to refuse allowing [place] to speak with collateral.  Patient does state that she had been living with sister but also refuses to allow [place] to speak with sister.  Only available emergency contact is a Adalberto Ill who is patient's daughter.  Patient denies present SI/HI/AVH.  Patient denies somatic complaints from present medications.  Total Time spent with patient: 20 minutes  Past Psychiatric History: See H&P  Past Medical History:  Past Medical History:  Diagnosis Date   Anxiety    Anxiety    Depression    Schizophrenia ([place])     History reviewed. No pertinent surgical history. Family History: History reviewed. No pertinent family history. Family Psychiatric  History: See H&P Social History:  Social History   Substance and Sexual Activity  Alcohol Use Not Currently   Comment: 2-3  drinks but none recently per pt report     Social History   Substance and Sexual Activity  Drug Use Not Currently   Types: Methamphetamines   Comment: "SEVERAL MONTHS AGO"    Social History   Socioeconomic History   Marital status: Single    Spouse name: Not on file   Number of children: Not on file   Years of education: Not on file   Highest education level: Not on file  Occupational History   Not on file  Tobacco Use   Smoking status: Every Day    Passive exposure: Past   Smokeless tobacco: Never  Substance and Sexual Activity   Alcohol use: Not Currently    Comment: 2-3 drinks but none recently per pt report   Drug use: Not Currently    Types: Methamphetamines    Comment: "SEVERAL MONTHS AGO"   Sexual activity: Yes    Partners: Male  Other Topics Concern   Not on file  Social History Narrative   Not on file   Social Determinants of Health   Financial Resource Strain: Not on file  Food Insecurity: Not on file  Transportation Needs: Not on file  Physical Activity: Not on file  Stress: Not on file  Social Connections: Not on file   Additional Social History:           Sleep: Good  Appetite:  Good  Current Medications: Current Facility-Administered Medications  Medication Dose Route Frequency Provider Last Rate Last Admin   acetaminophen (TYLENOL) tablet 650 mg  650 mg Oral Q6H PRN Ntuen, Kris Hartmann, FNP   650 mg at 07/25/22 1437   alum &  mag hydroxide-simeth (MAALOX/MYLANTA) 200-200-20 MG/5ML suspension 30 mL  30 mL Oral Q4H PRN Ntuen, Kris Hartmann, FNP       benztropine (COGENTIN) tablet 0.5 mg  0.5 mg Oral BID PRN Rosezetta Schlatter, MD       haloperidol (HALDOL) tablet 5 mg  5 mg Oral BID Rosezetta Schlatter, MD   5 mg at 07/26/22 0810   hydrOXYzine (ATARAX) tablet 25 mg  25 mg Oral TID PRN [name], FNP   25 mg at 07/25/22 2058   LORazepam (ATIVAN) tablet 1 mg  1 mg Oral Q6H PRN [name], MD       Or   LORazepam (ATIVAN) injection 1 mg  1 mg Intramuscular  Q6H PRN [name], MD       magnesium hydroxide (MILK OF MAGNESIA) suspension 30 mL  30 mL Oral Daily PRN Ntuen, Kris Hartmann, FNP       traZODone (DESYREL) tablet 50 mg  50 mg Oral QHS PRN [name], FNP   50 mg at 07/25/22 2058    Lab Results:  No results found for this or any previous visit (from the past 48 hour(s)).   Blood Alcohol level:  Lab Results  Component Value Date   ETH <10 07/21/2022   ETH <10 25/63/8937    Metabolic Disorder Labs: Lab Results  Component Value Date   HGBA1C 4.3 (L) 07/24/2022   MPG 76.71 07/24/2022   MPG 82.45 04/04/2021   Lab Results  Component Value Date   PROLACTIN 3.5 (L) 04/02/2021   Lab Results  Component Value Date   CHOL 159 07/24/2022   TRIG 51 07/24/2022   HDL 56 07/24/2022   CHOLHDL 2.8 07/24/2022   VLDL 10 07/24/2022   LDLCALC 93 07/24/2022   LDLCALC 104 (H) 04/04/2021    Physical Findings:  Musculoskeletal: Strength & Muscle Tone: within normal limits Gait & Station: normal Patient leans: N/A  Psychiatric Specialty Exam:  Presentation  General Appearance:  Casual; Bizarre; Disheveled (Appears much older than stated age; absence of teeth)   Eye Contact: Fleeting   Speech: Clear and Coherent; Normal Rate   Speech Volume: Normal   Handedness: Right    Mood and Affect  Mood: -- (Okay)   Affect: Constricted    Thought Process  Thought Processes: Goal Directed   Descriptions of Associations:Intact   Orientation:Partial (States name, date, and location.  Does not know her age (responds 43))   Thought Content:Illogical; Delusions   History of Schizophrenia/Schizoaffective disorder:Yes   Duration of Psychotic Symptoms:Greater than six months   Hallucinations:No data recorded  Ideas of Reference:None   Suicidal Thoughts:No data recorded  Homicidal Thoughts:No data recorded   Sensorium  Memory: Immediate Poor; Recent  Poor   Judgment: Impaired   Insight: Poor    Executive Functions  Concentration: Fair   Attention Span: Fair   Recall: [place] of Knowledge: Poor   Language: Poor    Psychomotor Activity  Psychomotor Activity: No data recorded   Assets  Assets: Communication Skills; Desire for Improvement; Physical Health; Social Support    Sleep  Sleep: No data recorded    Physical Exam: Vitals reviewed.  Constitutional:      General: She is not in acute distress.    Appearance: She is normal weight.     Comments: Appears much older than stated age  HENT:     Head: Normocephalic and atraumatic.     Mouth/Throat:     Mouth: Mucous membranes are  moist.     Pharynx: Oropharynx is clear.     Comments: Absent teeth Pulmonary:     Effort: Pulmonary effort is normal.  Skin:    General: Skin is warm and dry.  Neurological:     Motor: No weakness.     Gait: Gait normal.  Review of Systems  Constitutional:  Negative for malaise/fatigue.  Respiratory:  Negative for shortness of breath.   Cardiovascular:  Negative for chest pain.  Gastrointestinal: Negative.   Genitourinary: Negative.   Musculoskeletal:  Negative for myalgias.  Neurological:  Negative for headaches.   Blood pressure 123/79, pulse 84, temperature 97.9 F (36.6 C), temperature source Oral, resp. rate 16, height _0  (1.6 m), weight 53.1 kg, SpO2 100 %. Body mass index is 20.73 kg/m.   Treatment Plan Summary: ASSESSMENT: Principal Problem:   Schizophrenia ([place]) Active Problems:   Tobacco use disorder   History of amphetamine dependence/abuse [facility])  Patient is a 36 year old female with history of schizophrenia who continues to present disorganized in thought process, reporting symptoms inconsistently to different providers.  She remains psychotic, but appears to be tolerating Haldol well.  Patient requires continued inpatient admission for crisis stabilization, medication optimization,  and safety planning, as she has refused to provide consents to speak with family or friends thus far.  Patient appears to be improving in terms of psychosis appearing less disorganized and more coherent.  Patient does have occasional disorganized speech and thought blocking but does appear improved compared to yesterday.      PLAN: Safety and Monitoring: INVOLUTARILY (By [place]) admission to inpatient psychiatric unit for safety, stabilization and treatment Daily contact with patient to assess and evaluate symptoms and progress in treatment Patient's case to be discussed in multi-disciplinary team meeting Observation Level : q15 minute checks Vital signs: q12 hours Precautions: suicide, elopement, and assault   2. Psychiatric Diagnoses and Treatment # Schizophrenia versus schizoaffective disorder bipolar type - Continue Haldol 5 mg twice daily, will continue to titrate - Previously discontinued prescribed Zoloft 50 mg daily due to futility and concerns for bipolar disorder symptomology. - Continue Cogentin 0.5 mg twice daily as needed   PRN: Hydroxyzine 25 mg 3 times daily for anxiety, trazodone 50 mg nightly for sleep, agitation protocol: Geodon 20 mg, Risperdal 2 mg, and Ativan 1 mg   -- The risks/benefits/side-effects/alternatives to this medication were discussed in detail with the patient and time was given for questions. The patient consents to medication trial.              -- Metabolic profile and EKG monitoring obtained while on an atypical antipsychotic  BMI: 20.73 TSH: 1.365 Lipid Panel: WNL HbgA1c: 4.3% QTc: 462             -- Encouraged patient to participate in unit milieu and in scheduled group therapies      3. Medical Issues Being Addressed: #History of hepatitis C ALT, AST, alk phos all WNL -Recommend outpatient follow-up   4. Discharge Planning:              -- Social work and case management to assist with discharge planning and identification of  hospital follow-up needs prior to discharge             -- Estimated LOS: 5-7 days             -- Discharge Concerns: Need to establish a safety plan; Medication compliance and effectiveness             --  Discharge Goals: Return home with outpatient referrals for mental health follow-up including medication management/psychotherapy    [name], MD 07/26/2022, 3:04 PM

## 2022-07-26 NOTE — Progress Notes (Signed)
Pt noted moaning, crying and restless in bed this evening guarding her abdomen on approach during rounds. Received PRN Tylenol 650 mg and Vistaril as ordered at 1843. Denies SI, HI and AVH "Not right now" when assessed earlier. Appeared less anxious with decrease pacing this shift. Tolerates medications, meals and fluids well. Pt is guarded majority of this shift, seems preoccupied, suspicious, and minimal but is verbally redirectable. Returned a new jacket back to staff because she did not trust staff after orientation group. Support, reassurance and encouragement provided to pt. Safety checks maintained at Q 15 minutes intervals without outburst.

## 2022-07-27 MED ORDER — LORAZEPAM 0.5 MG PO TABS
0.5000 mg | ORAL_TABLET | Freq: Two times a day (BID) | ORAL | Status: DC
  Administered 2022-07-27 – 2022-07-31 (×8): 0.5 mg via ORAL
  Filled 2022-07-27 (×8): qty 1

## 2022-07-27 MED ORDER — HALOPERIDOL 5 MG PO TABS
5.0000 mg | ORAL_TABLET | Freq: Every day | ORAL | Status: DC
  Administered 2022-07-28 – 2022-07-31 (×4): 5 mg via ORAL
  Filled 2022-07-27 (×5): qty 1

## 2022-07-27 MED ORDER — BENZTROPINE MESYLATE 0.5 MG PO TABS
0.5000 mg | ORAL_TABLET | Freq: Two times a day (BID) | ORAL | Status: DC
  Administered 2022-07-28 – 2022-07-31 (×7): 0.5 mg via ORAL
  Filled 2022-07-27 (×10): qty 1

## 2022-07-27 MED ORDER — HALOPERIDOL 5 MG PO TABS
10.0000 mg | ORAL_TABLET | Freq: Every day | ORAL | Status: DC
  Administered 2022-07-27 – 2022-07-30 (×4): 10 mg via ORAL
  Filled 2022-07-27 (×5): qty 2

## 2022-07-27 NOTE — Progress Notes (Addendum)
[facility] MD Progress Note  07/27/2022 7:29 AM Emily [name]  MRN:  [id number]  Reason for Hospitalization:  Emily [name] is a 36 year old female with past psychiatric history of schizophrenia, methamphetamine induced mood disorder, cannabis use disorder, nicotine use disorder, and amphetamine use disorder who presented involuntarily from [facility] ED for psychosis and acute decompensation in the setting of medication noncompliance.  Principal Problem: Schizophrenia ([place]) Diagnosis: Principal Problem:   Schizophrenia ([place]) Active Problems:   Tobacco use disorder   History of amphetamine dependence/abuse ([place])   Chart Review of Past 24 Hours: Per MAR, patient has been compliant with all scheduled medications. Patient did not attend groups yesterday but has not been a behavioral concern. Patient required the following behavioral PRNs: Ativan x1 due to pacing and responding to internal stimuli   On Evaluation Today:  Patient seen and assessed at bedside. Patient was sleeping on arrival. Reports decreased anxiety and restlessness. Reports feeling "better" yesterday. Unable to specify what aspects. She is more organized in speech today. Able to say more complete sentences. Denies SI/HI/AH. Reports VH of "old people" in front of patient. Continues to be paranoid and guarded. Denies abdominal pain today. Denies anxiety and restlessness. Refused AIMS. Continues to refuse collateral. States she has not spoken with daughter for some time. Patient denies somatic complaints from present medications.  Total Time spent with patient: 20 minutes  Past Psychiatric History: See H&P  Past Medical History:  Past Medical History:  Diagnosis Date   Anxiety    Anxiety    Depression    Schizophrenia ([place])     History reviewed. No pertinent surgical history. Family History: History reviewed. No pertinent family history. Family Psychiatric  History: See H&P Social History:  Social History   Substance and  Sexual Activity  Alcohol Use Not Currently   Comment: 2-3 drinks but none recently per pt report     Social History   Substance and Sexual Activity  Drug Use Not Currently   Types: Methamphetamines   Comment: "SEVERAL MONTHS AGO"    Social History   Socioeconomic History   Marital status: Single    Spouse name: Not on file   Number of children: Not on file   Years of education: Not on file   Highest education level: Not on file  Occupational History   Not on file  Tobacco Use   Smoking status: Every Day    Passive exposure: Past   Smokeless tobacco: Never  Substance and Sexual Activity   Alcohol use: Not Currently    Comment: 2-3 drinks but none recently per pt report   Drug use: Not Currently    Types: Methamphetamines    Comment: "SEVERAL MONTHS AGO"   Sexual activity: Yes    Partners: Male  Other Topics Concern   Not on file  Social History Narrative   Not on file   Social Determinants of Health   Financial Resource Strain: Not on file  Food Insecurity: Not on file  Transportation Needs: Not on file  Physical Activity: Not on file  Stress: Not on file  Social Connections: Not on file   Additional Social History:           Sleep: Good  Appetite:  Good  Current Medications: Current Facility-Administered Medications  Medication Dose Route Frequency Provider Last Rate Last Admin   acetaminophen (TYLENOL) tablet 650 mg  650 mg Oral Q6H PRN Ntuen, Kris Hartmann, FNP   650 mg at 07/26/22 1843   alum &  mag hydroxide-simeth (MAALOX/MYLANTA) 200-200-20 MG/5ML suspension 30 mL  30 mL Oral Q4H PRN Ntuen, Kris Hartmann, FNP       benztropine (COGENTIN) tablet 0.5 mg  0.5 mg Oral BID PRN Rosezetta Schlatter, MD       haloperidol (HALDOL) tablet 5 mg  5 mg Oral BID Rosezetta Schlatter, MD   5 mg at 07/26/22 2035   hydrOXYzine (ATARAX) tablet 25 mg  25 mg Oral TID PRN [name], FNP   25 mg at 07/26/22 1843   LORazepam (ATIVAN) tablet 1 mg  1 mg Oral Q6H PRN [name], MD       Or    LORazepam (ATIVAN) injection 1 mg  1 mg Intramuscular Q6H PRN [name], MD       magnesium hydroxide (MILK OF MAGNESIA) suspension 30 mL  30 mL Oral Daily PRN Ntuen, Kris Hartmann, FNP       traZODone (DESYREL) tablet 50 mg  50 mg Oral QHS PRN [name], FNP   50 mg at 07/26/22 2035    Lab Results:  No results found for this or any previous visit (from the past 48 hour(s)).   Blood Alcohol level:  Lab Results  Component Value Date   ETH <10 07/21/2022   ETH <10 66/59/9357    Metabolic Disorder Labs: Lab Results  Component Value Date   HGBA1C 4.3 (L) 07/24/2022   MPG 76.71 07/24/2022   MPG 82.45 04/04/2021   Lab Results  Component Value Date   PROLACTIN 3.5 (L) 04/02/2021   Lab Results  Component Value Date   CHOL 159 07/24/2022   TRIG 51 07/24/2022   HDL 56 07/24/2022   CHOLHDL 2.8 07/24/2022   VLDL 10 07/24/2022   LDLCALC 93 07/24/2022   LDLCALC 104 (H) 04/04/2021    Physical Findings:  Musculoskeletal: Strength & Muscle Tone: within normal limits Gait & Station: normal Patient leans: N/A  Psychiatric Specialty Exam:  Presentation  General Appearance:  Casual; Bizarre; Disheveled (Appears much older than stated age; absence of teeth)   Eye Contact: Fleeting   Speech: Clear and Coherent; Normal Rate   Speech Volume: Normal   Handedness: Right    Mood and Affect  Mood: -- (Okay)   Affect: Constricted    Thought Process  Thought Processes: Goal Directed   Descriptions of Associations:Intact   Orientation:Partial (States name, date, and location.  Does not know her age (responds 3))   Thought Content:Illogical; Delusions   History of Schizophrenia/Schizoaffective disorder:Yes   Duration of Psychotic Symptoms:Greater than six months   Hallucinations: VH of "old people". No AH  Ideas of Reference:None   Suicidal Thoughts:denies  Homicidal Thoughts:denies   Sensorium  Memory: Immediate Poor; Recent  Poor   Judgment: Impaired   Insight: Poor    Executive Functions  Concentration: Fair   Attention Span: Fair   Recall: [place] of Knowledge: Poor   Language: Poor    Psychomotor Activity  Psychomotor Activity: No data recorded   Assets  Assets: Communication Skills; Desire for Improvement; Physical Health; Social Support    Sleep  Sleep: No data recorded    Physical Exam: Vitals reviewed.  Constitutional:      General: She is not in acute distress.    Appearance: She is normal weight.     Comments: Appears much older than stated age  HENT:     Head: Normocephalic and atraumatic.     Mouth/Throat:     Mouth: Mucous membranes are  moist.     Pharynx: Oropharynx is clear.     Comments: Absent teeth Pulmonary:     Effort: Pulmonary effort is normal.  Skin:    General: Skin is warm and dry.  Neurological:     Motor: No weakness.     Gait: Gait normal.  Review of Systems  Constitutional:  Negative for malaise/fatigue.  Respiratory:  Negative for shortness of breath.   Cardiovascular:  Negative for chest pain.  Gastrointestinal: Negative.   Genitourinary: Negative.   Musculoskeletal:  Negative for myalgias.  Neurological:  Negative for headaches.   Blood pressure 106/84, pulse (!) 112, temperature 98.3 F (36.8 C), temperature source Oral, resp. rate 16, height _0  (1.6 m), weight 53.1 kg, SpO2 99 %. Body mass index is 20.73 kg/m.   Treatment Plan Summary: ASSESSMENT: Principal Problem:   Schizophrenia ([place]) Active Problems:   Tobacco use disorder   History of amphetamine dependence/abuse [facility])  Patient is a 35 year old female with history of schizophrenia who continues to present disorganized in thought process, reporting symptoms inconsistently to different providers.  She remains psychotic, but appears to be tolerating Haldol well.  Patient requires continued inpatient admission for crisis stabilization, medication  optimization, and safety planning, as she has refused to provide consents to speak with family or friends thus far.  Patient appears to be improving in terms of psychosis appearing less disorganized and more coherent.  Patient does have occasional disorganized speech and thought blocking but does appear improved compared to yesterday.      PLAN: Safety and Monitoring: INVOLUTARILY (By [place]) admission to inpatient psychiatric unit for safety, stabilization and treatment Daily contact with patient to assess and evaluate symptoms and progress in treatment Patient's case to be discussed in multi-disciplinary team meeting Observation Level : q15 minute checks Vital signs: q12 hours Precautions: suicide, elopement, and assault   2. Psychiatric Diagnoses and Treatment # Schizophrenia versus schizoaffective disorder bipolar type - INCREASE Haldol to 5 mg in AM and 10 mg at night, will continue to titrate as needed  -Did not observe abnormal facial or extremity movements - START scheduled Cogentin 0.5 mg twice daily for EPS prophylaxis   PRN: Hydroxyzine 25 mg 3 times daily for anxiety, trazodone 50 mg nightly for sleep, agitation protocol: Geodon 20 mg, Risperdal 2 mg, and Ativan 1 mg   -- The risks/benefits/side-effects/alternatives to this medication were discussed in detail with the patient and time was given for questions. The patient consents to medication trial.              -- Metabolic profile and EKG monitoring obtained while on an atypical antipsychotic  BMI: 20.73 TSH: 1.365 Lipid Panel: WNL HbgA1c: 4.3% QTc: 462             -- Encouraged patient to participate in unit milieu and in scheduled group therapies      3. Medical Issues Being Addressed: #History of hepatitis C ALT, AST, alk phos all WNL -Recommend outpatient follow-up   4. Discharge Planning:              -- Social work and case management to assist with discharge planning and identification of hospital  follow-up needs prior to discharge             -- Estimated LOS: 5-7 days             -- Discharge Concerns: Need to establish a safety plan; Medication compliance and effectiveness             --  Discharge Goals: Return home with outpatient referrals for mental health follow-up including medication management/psychotherapy    [name], MD 07/27/2022, 7:29 AM

## 2022-07-27 NOTE — B[facility] Counselor (Signed)
[facility] LCSW Progress Note   07/27/2022    10:36 AM  Emily [name]   [id number]   Type of Contact and Topic:  Consent for Collateral Contact  CSW met with patient to get consent to speak with patient collateral.  Patient states that she plans to go back to stay with her sister in [place].  Patient unable to produce contact information.  CSW requested that if sister calls to get her phone number so that we can do safety planning.     Signed:  Riki Altes MSW, LCSW, LCAS 07/27/2022 10:36 AM

## 2022-07-27 NOTE — Progress Notes (Signed)
Adult Psychoeducational Group Note  Date:  07/27/2022 Time:  8:54 PM  Group Topic/Focus:  Wrap-Up Group:   The focus of this group is to help patients review their daily goal of treatment and discuss progress on daily workbooks.  Participation Level:  None  Participation Quality:   none  Affect:  Appropriate  Cognitive:   n/a  Insight: None  Engagement in Group:  None  Modes of Intervention:  Discussion and Support  Additional Comments:   Pt attended the Wrap Up group but did not participate. Pt did not rate her day, identify any goals, discuss progress or share any coping skills or questions or concerns.  Wetzel Bjornstad Emily [name] 07/27/2022, 8:54 PM

## 2022-07-27 NOTE — Progress Notes (Signed)
   07/27/22 1000  Psych Admission Type (Psych Patients Only)  Admission Status Involuntary  Psychosocial Assessment  Patient Complaints Anxiety  Eye Contact Brief  Facial Expression Flat  Affect Apprehensive;Anxious;Depressed  Speech Logical/coherent  Interaction Avoidant;Cautious  Motor Activity Fidgety  Appearance/Hygiene Improved  Behavior Characteristics Cooperative  Mood Anxious  Aggressive Behavior  Effect No apparent injury  Thought Pension scheme manager thinking  Content WDL  Delusions None reported or observed  Perception Hallucinations  Hallucination Auditory  Judgment Impaired  Confusion None  Danger to Self  Current suicidal ideation? Denies  Agreement Not to Harm Self Yes  Description of Agreement Verbal  Danger to Others  Danger to Others None reported or observed

## 2022-07-27 NOTE — Progress Notes (Signed)
   07/27/22 0515  Sleep  Number of Hours 9.25

## 2022-07-27 NOTE — Group Note (Signed)
Recreation Therapy Group Note   Group Topic:Coping Skills  Group Date: 07/27/2022 Start Time: 1000 End Time: 1027 Facilitators: Avonne Berkery-McCall, LRT,CTRS Location: [address] Dayroom   Goal Area(s) Addresses: Patient will define what a coping skill is. Patient will successfully identify positive coping skills they can use post d/c.  Patient will acknowledge benefit(s) of using learned coping skills post d/c.  Group Description:  Coping A to Z. Patient asked to identify what a coping skill is and when they use them. Patients with Probation officer discussed healthy versus unhealthy coping skills. Next patients were given a blank worksheet titled "Coping Skills A-Z". Patients were instructed to come up with at least one positive coping skill per letter of the alphabet, addressing a specific challenge (ex: stress, anger, anxiety, depression, grief, doubt, isolation, self-harm/suicidal thoughts, substance use). Patients were given 15 minutes to brainstorm before ideas were presented to the large group. Patients and LRT debriefed on the importance of coping skill selection based on situation and back-up plans when a skill tried is not effective. At the end of group, patients were given an handout of alphabetized strategies to keep for future reference.   Affect/Mood: Euphoric   Participation Level: None   Participation Quality: Independent   Behavior: Disinterested   Speech/Thought Process: None   Insight: Lacking   Judgement: Lacking    Modes of Intervention: Worksheet   Patient Response to Interventions:  Disengaged   Education Outcome:  Acknowledges education and In group clarification offered    Clinical Observations/Individualized Feedback: Pt appeared to be staring off during group.  Pt was to identify coping skills for substance abuse.  Pt wasn't able to identify any coping skills and just seemed disengaged throughout group session.   Plan: Continue to engage patient in RT  group sessions 2-3x/week.   Emily [name], LRT,CTRS 07/27/2022 11:35 AM

## 2022-07-27 NOTE — Progress Notes (Signed)
   07/27/22 2230  Psych Admission Type (Psych Patients Only)  Admission Status Involuntary  Psychosocial Assessment  Patient Complaints Anxiety  Eye Contact Brief  Facial Expression Flat  Affect Depressed  Speech Logical/coherent  Interaction Cautious  Motor Activity Fidgety  Appearance/Hygiene Improved  Behavior Characteristics Cooperative  Aggressive Behavior  Effect No apparent injury  Thought Process  Coherency Concrete thinking  Content WDL  Delusions None reported or observed  Perception Hallucinations  Hallucination Auditory  Judgment Impaired  Confusion None  Danger to Self  Current suicidal ideation? Denies  Danger to Others  Danger to Others None reported or observed

## 2022-07-28 ENCOUNTER — Encounter (HOSPITAL_COMMUNITY): Payer: Self-pay

## 2022-07-28 NOTE — Progress Notes (Signed)
   07/28/22 0900  Psych Admission Type (Psych Patients Only)  Admission Status Involuntary  Psychosocial Assessment  Patient Complaints Anxiety  Eye Contact Brief  Facial Expression Flat  Affect Depressed;Apprehensive  Speech Logical/coherent  Interaction Cautious  Motor Activity Fidgety  Appearance/Hygiene Improved  Behavior Characteristics Cooperative  Aggressive Behavior  Effect No apparent injury  Thought Process  Coherency Concrete thinking  Content WDL  Delusions None reported or observed  Perception Hallucinations  Hallucination Auditory  Judgment Impaired  Confusion None  Danger to Self  Current suicidal ideation? Denies  Agreement Not to Harm Self Yes  Description of Agreement Verbal  Danger to Others  Danger to Others None reported or observed

## 2022-07-28 NOTE — BHH Group Notes (Signed)
Pt didn't attend group. 

## 2022-07-28 NOTE — BH IP Treatment Plan (Signed)
Interdisciplinary Treatment and Diagnostic Plan Update  07/28/2022 Time of Session: 0830 Emily [name] MRN: [id number]  Principal Diagnosis: Schizophrenia [facility])  Secondary Diagnoses: Principal Problem:   Schizophrenia ([name]) Active Problems:   Tobacco use disorder   History of amphetamine dependence/abuse ([name])   Current Medications:  Current Facility-Administered Medications  Medication Dose Route Frequency Provider Last Rate Last Admin   acetaminophen (TYLENOL) tablet 650 mg  650 mg Oral Q6H PRN Ntuen, Kris Hartmann, FNP   650 mg at 07/26/22 1843   alum & mag hydroxide-simeth (MAALOX/MYLANTA) 200-200-20 MG/5ML suspension 30 mL  30 mL Oral Q4H PRN Ntuen, Kris Hartmann, FNP       benztropine (COGENTIN) tablet 0.5 mg  0.5 mg Oral BID [name], MD   0.5 mg at 07/28/22 0816   haloperidol (HALDOL) tablet 10 mg  10 mg Oral Aliene Altes, MD   10 mg at 07/27/22 2039   haloperidol (HALDOL) tablet 5 mg  5 mg Oral Daily [name], MD   5 mg at 07/28/22 0816   hydrOXYzine (ATARAX) tablet 25 mg  25 mg Oral TID PRN [name], FNP   25 mg at 07/26/22 1843   LORazepam (ATIVAN) tablet 1 mg  1 mg Oral Q6H PRN [name], MD       Or   LORazepam (ATIVAN) injection 1 mg  1 mg Intramuscular Q6H PRN [name], MD       LORazepam (ATIVAN) tablet 0.5 mg  0.5 mg Oral BID [name], Nadir, MD   0.5 mg at 07/28/22 0816   magnesium hydroxide (MILK OF MAGNESIA) suspension 30 mL  30 mL Oral Daily PRN Ntuen, Kris Hartmann, FNP       traZODone (DESYREL) tablet 50 mg  50 mg Oral QHS PRN [name], FNP   50 mg at 07/27/22 2039   PTA Medications: No medications prior to admission.    Patient Stressors: Medication change or noncompliance    Patient Strengths: Average or above average intelligence  Motivation for treatment/growth  Physical Health  Supportive family/friends   Treatment Modalities: Medication Management, Group therapy, Case management,  1 to 1 session with clinician, Psychoeducation, Recreational  therapy.   Physician Treatment Plan for Primary Diagnosis: Schizophrenia ([place]) Long Term Goal(s): Improvement in symptoms so as ready for discharge   Short Term Goals: Ability to identify changes in lifestyle to reduce recurrence of condition will improve Ability to verbalize feelings will improve Ability to disclose and discuss suicidal ideas Ability to demonstrate self-control will improve Ability to identify and develop effective coping behaviors will improve Ability to maintain clinical measurements within normal limits will improve Compliance with prescribed medications will improve Ability to identify triggers associated with substance abuse/mental health issues will improve  Medication Management: Evaluate patient's response, side effects, and tolerance of medication regimen.  Therapeutic Interventions: 1 to 1 sessions, Unit Group sessions and Medication administration.  Evaluation of Outcomes: Progressing  Physician Treatment Plan for Secondary Diagnosis: Principal Problem:   Schizophrenia ([place]) Active Problems:   Tobacco use disorder   History of amphetamine dependence/abuse ([name])  Long Term Goal(s): Improvement in symptoms so as ready for discharge   Short Term Goals: Ability to identify changes in lifestyle to reduce recurrence of condition will improve Ability to verbalize feelings will improve Ability to disclose and discuss suicidal ideas Ability to demonstrate self-control will improve Ability to identify and develop effective coping behaviors will improve Ability to maintain clinical measurements within normal limits will improve Compliance with  prescribed medications will improve Ability to identify triggers associated with substance abuse/mental health issues will improve     Medication Management: Evaluate patient's response, side effects, and tolerance of medication regimen.  Therapeutic Interventions: 1 to 1 sessions, Unit Group sessions and Medication  administration.  Evaluation of Outcomes: Progressing   RN Treatment Plan for Primary Diagnosis: Schizophrenia ([facility]) Long Term Goal(s): Knowledge of disease and therapeutic regimen to maintain health will improve  Short Term Goals: Ability to remain free from injury will improve, Ability to verbalize frustration and anger appropriately will improve, Ability to demonstrate self-control, Ability to participate in decision making will improve, Ability to verbalize feelings will improve, Ability to disclose and discuss suicidal ideas, Ability to identify and develop effective coping behaviors will improve, and Compliance with prescribed medications will improve  Medication Management: RN will administer medications as ordered by provider, will assess and evaluate patient's response and provide education to patient for prescribed medication. RN will report any adverse and/or side effects to prescribing provider.  Therapeutic Interventions: 1 on 1 counseling sessions, Psychoeducation, Medication administration, Evaluate responses to treatment, Monitor vital signs and CBGs as ordered, Perform/monitor CIWA, COWS, AIMS and Fall Risk screenings as ordered, Perform wound care treatments as ordered.  Evaluation of Outcomes: Progressing   LCSW Treatment Plan for Primary Diagnosis: Schizophrenia (HCC) Long Term Goal(s): Safe transition to appropriate next level of care at discharge, Engage patient in therapeutic group addressing interpersonal concerns.  Short Term Goals: Engage patient in aftercare planning with referrals and resources, Increase social support, Increase ability to appropriately verbalize feelings, Increase emotional regulation, Facilitate acceptance of mental health diagnosis and concerns, Facilitate patient progression through stages of change regarding substance use diagnoses and concerns, Identify triggers associated with mental health/substance abuse issues, and Increase skills for wellness  and recovery  Therapeutic Interventions: Assess for all discharge needs, 1 to 1 time with Social worker, Explore available resources and support systems, Assess for adequacy in community support network, Educate family and significant other(s) on suicide prevention, Complete Psychosocial Assessment, Interpersonal group therapy.  Evaluation of Outcomes: Progressing   Progress in Treatment: Attending groups: No. Participating in groups: No. Taking medication as prescribed: Yes. Toleration medication: Yes. Family/Significant other contact made: No, will contact:  patient declined consent to reach family/friend.  Patient understands diagnosis: No. Discussing patient identified problems/goals with staff: Yes. Medical problems stabilized or resolved: Yes. Denies suicidal/homicidal ideation: Yes. Issues/concerns per patient self-inventory: Yes. Other: none  New problem(s) identified: No, Describe:  none  New Short Term/Long Term Goal(s): Patient to work towards elimination of symptoms of psychosis, medication management for mood stabilization; development of comprehensive mental wellness plan.  Patient Goals:  No additional goals identified at this time. Patient to continue to work towards original goals identified in initial treatment team meeting. CSW will remain available to patient should they voice additional treatment goals.   Discharge Plan or Barriers:  No psychosocial barriers identified at this time, patient to return to place of residence when appropriate for discharge.   Reason for Continuation of Hospitalization: Medication stabilization Other; describe psychosis   Estimated Length of Stay: 1-7 days   Last [facility] 2/9 Scores:    02/10/2022    2:36 PM  Depression screen PHQ 2/9  Decreased Interest 1  Down, Depressed, Hopeless 1  PHQ - 2 Score 2  Altered sleeping 1  Tired, decreased energy 1  Change in appetite 1  Feeling bad or failure about yourself  2  Trouble  concentrating 1  Moving slowly or fidgety/restless 1  Suicidal thoughts 1  PHQ-9 Score 10    Scribe for Treatment Team: Almedia Balls 07/28/2022 9:28 AM

## 2022-07-28 NOTE — Group Note (Signed)
Recreation Therapy Group Note   Group Topic:Relaxation  Group Date: 07/28/2022 Start Time: 1005 End Time: 1030 Facilitators: Yuchen Fedor-McCall, LRT,CTRS Location: [address] Dayroom   Goal Area(s) Addresses:  Patient will identify positive relaxation techniques. Patient will identify benefits of using relaxation post d/c.  Group Description: Music Therapy.  LRT introduced patients to the concept of using music as a tool of relaxation.  Patients were able to choose songs they liked and LRT would play them for the group.  The only stipulation was the songs had to be clean and appropriate for the session.    Affect/Mood: Flat   Participation Level: Minimal   Participation Quality: Independent   Behavior: Appropriate   Speech/Thought Process: Barely audible  and Unfocused   Insight: Moderate   Judgement: Moderate   Modes of Intervention: Music   Patient Response to Interventions:  Receptive   Education Outcome:  Acknowledges education and In group clarification offered    Clinical Observations/Individualized Feedback: Pt was quiet and engaged when prompted.  Pt didn't identify any songs she liked.  Pt stayed for a short while before leaving and not returning.     Plan: Continue to engage patient in RT group sessions 2-3x/week.   Eurika Sandy-McCall, LRT,CTRS 07/28/2022 12:48 PM

## 2022-07-28 NOTE — BHH Group Notes (Signed)
Spirituality group facilitated by Chaplain Katy Michaeline Eckersley, BCC.   Group Description: Group focused on topic of hope. Patients participated in facilitated discussion around topic, connecting with one another around experiences and definitions for hope. Group members engaged with visual explorer photos, reflecting on what hope looks like for them today. Group engaged in discussion around how their definitions of hope are present today in hospital.   Modalities: Psycho-social ed, Adlerian, Narrative, MI   Patient Progress: Did not attend.  

## 2022-07-28 NOTE — BHH Suicide Risk Assessment (Signed)
[place] INPATIENT:  Family/Significant Other Suicide Prevention Education  Suicide Prevention Education:  Education Completed; Adalberto Ill, [phone]  (name of family member/significant other) has been identified by the patient as the family member/significant other with whom the patient will be residing, and identified as the person(s) who will aid the patient in the event of a mental health crisis (suicidal ideations/suicide attempt).  With written consent from the patient, the family member/significant other has been provided the following suicide prevention education, prior to the and/or following the discharge of the patient.  CSW spoke with patient daughter and she reports that she has a psychiatric history of schizophrenia.  Daughter reports that patient was recently just dropped off at the sisters after not seeing her for 2 years.  At this time patient can not return due to an incident that occurred prior to hospitalization.  Patient has not been adherent to medications.  Daughter provided sister number [phone].  Daughter reports that she has had some aggression in the past but does not have any current safety concerns.  No access to guns/weapons.  The suicide prevention education provided includes the following: Suicide risk factors Suicide prevention and interventions National Suicide Hotline telephone number [facility] assessment telephone number [facility] Emergency Assistance [place] and/or Residential Mobile Crisis Unit telephone number  Request made of family/significant other to: Remove weapons (e.g., guns, rifles, knives), all items previously/currently identified as safety concern.   Remove drugs/medications (over-the-counter, prescriptions, illicit drugs), all items previously/currently identified as a safety concern.  The family member/significant other verbalizes understanding of the suicide prevention education information provided.  The  family member/significant other agrees to remove the items of safety concern listed above.  Rodrick Payson E Jazzlin Clements 07/28/2022, 2:44 PM

## 2022-07-28 NOTE — Progress Notes (Signed)
   07/28/22 2145  Psych Admission Type (Psych Patients Only)  Admission Status Involuntary  Psychosocial Assessment  Patient Complaints Anxiety  Eye Contact Brief  Facial Expression Flat  Affect Depressed  Speech Logical/coherent  Interaction Cautious  Motor Activity Fidgety  Appearance/Hygiene Improved  Behavior Characteristics Cooperative  Mood Anxious  Aggressive Behavior  Effect No apparent injury  Thought Process  Coherency Concrete thinking  Content WDL  Delusions None reported or observed  Perception Hallucinations  Hallucination Auditory  Judgment Impaired  Confusion None  Danger to Self  Current suicidal ideation? Denies  Danger to Others  Danger to Others None reported or observed

## 2022-07-28 NOTE — Progress Notes (Signed)
   07/28/22 0515  Sleep  Number of Hours 8.25

## 2022-07-28 NOTE — Group Note (Signed)
Therapy Type: Group Therapy  Participation Level:  Did Not Attend   Patients received a worksheet with an outline of 2 gingerbread men with a separation in the middle of the page. One sign designated what the pt sees about themselves and the other is what others see. Pts were asked to introduce themselves and share something they like about themself. Pts were then asked to draw, write or color how they view themselves as well as how they are viewed by others. CSW led discussion about the feelings and words associated with each side.   Patient Summary:   Did not attend    Emily Thibeau, LCSW, [facility]

## 2022-07-28 NOTE — Progress Notes (Signed)
[facility] MD Progress Note  07/28/2022 4:04 PM MARILI VADER  MRN:  [id number]  Reason for Hospitalization:  Shalunda Lindh is a 36 year old female with past psychiatric history of schizophrenia, methamphetamine induced mood disorder, cannabis use disorder, nicotine use disorder, and amphetamine use disorder who presented involuntarily from [facility] ED for psychosis and acute decompensation in the setting of medication noncompliance.  Principal Problem: Schizophrenia ([place]) Diagnosis: Principal Problem:   Schizophrenia ([place]) Active Problems:   Tobacco use disorder   History of amphetamine dependence/abuse ([place])   Chart Review of Past 24 Hours: Per MAR, patient has been compliant with all scheduled medications. Patient did not attend groups yesterday but has not been a behavioral concern. Patient required the following behavioral PRNs: Ativan x1 due to pacing and responding to internal stimuli   On Evaluation Today:  Patient seen and assessed at bedside. Patient was sleeping on arrival.  Reports decreased anxiety and restlessness after starting Ativan.  Denies having any SI/HI/AVH today.  Continues to be guarded but less paranoid.  She continues to be less disorganized but was somnolent today likely secondary to the Ativan. Able to say more complete sentences. Patient denies somatic complaints from present medications.  Total Time spent with patient: 20 minutes  Past Psychiatric History: See H&P  Past Medical History:  Past Medical History:  Diagnosis Date   Anxiety    Anxiety    Depression    Schizophrenia ([place])     History reviewed. No pertinent surgical history. Family History: History reviewed. No pertinent family history. Family Psychiatric  History: See H&P Social History:  Social History   Substance and Sexual Activity  Alcohol Use Not Currently   Comment: 2-3 drinks but none recently per pt report     Social History   Substance and Sexual Activity  Drug Use Not Currently    Types: Methamphetamines   Comment: "SEVERAL MONTHS AGO"    Social History   Socioeconomic History   Marital status: Single    Spouse name: Not on file   Number of children: Not on file   Years of education: Not on file   Highest education level: Not on file  Occupational History   Not on file  Tobacco Use   Smoking status: Every Day    Passive exposure: Past   Smokeless tobacco: Never  Substance and Sexual Activity   Alcohol use: Not Currently    Comment: 2-3 drinks but none recently per pt report   Drug use: Not Currently    Types: Methamphetamines    Comment: "SEVERAL MONTHS AGO"   Sexual activity: Yes    Partners: Male  Other Topics Concern   Not on file  Social History Narrative   Not on file   Social Determinants of Health   Financial Resource Strain: Not on file  Food Insecurity: Not on file  Transportation Needs: Not on file  Physical Activity: Not on file  Stress: Not on file  Social Connections: Not on file   Additional Social History:           Sleep: Good  Appetite:  Good  Current Medications: Current Facility-Administered Medications  Medication Dose Route Frequency Provider Last Rate Last Admin   acetaminophen (TYLENOL) tablet 650 mg  650 mg Oral Q6H PRN Ntuen, Kris Hartmann, FNP   650 mg at 07/26/22 1843   alum & mag hydroxide-simeth (MAALOX/MYLANTA) 200-200-20 MG/5ML suspension 30 mL  30 mL Oral Q4H PRN Ntuen, Kris Hartmann, FNP  benztropine (COGENTIN) tablet 0.5 mg  0.5 mg Oral BID [name], MD   0.5 mg at 07/28/22 0816   haloperidol (HALDOL) tablet 10 mg  10 mg Oral Aliene Altes, MD   10 mg at 07/27/22 2039   haloperidol (HALDOL) tablet 5 mg  5 mg Oral Daily [name], MD   5 mg at 07/28/22 0816   hydrOXYzine (ATARAX) tablet 25 mg  25 mg Oral TID PRN [name], FNP   25 mg at 07/26/22 1843   LORazepam (ATIVAN) tablet 1 mg  1 mg Oral Q6H PRN [name], MD       Or   LORazepam (ATIVAN) injection 1 mg  1 mg Intramuscular Q6H PRN [name], MD       LORazepam (ATIVAN) tablet 0.5 mg  0.5 mg Oral BID [name], Nadir, MD   0.5 mg at 07/28/22 0816   magnesium hydroxide (MILK OF MAGNESIA) suspension 30 mL  30 mL Oral Daily PRN Ntuen, Kris Hartmann, FNP       traZODone (DESYREL) tablet 50 mg  50 mg Oral QHS PRN [name], FNP   50 mg at 07/27/22 2039    Lab Results:  No results found for this or any previous visit (from the past 72 hour(s)).   Blood Alcohol level:  Lab Results  Component Value Date   ETH <10 07/21/2022   ETH <10 16/57/9038    Metabolic Disorder Labs: Lab Results  Component Value Date   HGBA1C 4.3 (L) 07/24/2022   MPG 76.71 07/24/2022   MPG 82.45 04/04/2021   Lab Results  Component Value Date   PROLACTIN 3.5 (L) 04/02/2021   Lab Results  Component Value Date   CHOL 159 07/24/2022   TRIG 51 07/24/2022   HDL 56 07/24/2022   CHOLHDL 2.8 07/24/2022   VLDL 10 07/24/2022   LDLCALC 93 07/24/2022   LDLCALC 104 (H) 04/04/2021    Physical Findings:  Musculoskeletal: Strength & Muscle Tone: within normal limits Gait & Station: normal Patient leans: N/A  Psychiatric Specialty Exam:  Presentation  General Appearance:  Casual; Bizarre; Disheveled (Appears much older than stated age; absence of teeth)   Eye Contact: Fleeting   Speech: Clear and Coherent; Normal Rate   Speech Volume: Normal   Handedness: Right    Mood and Affect  Mood: -- (Okay)   Affect: Constricted    Thought Process  Thought Processes: Goal Directed   Descriptions of Associations:Intact   Orientation:Partial (States name, date, and location.  Does not know her age (responds 36))   Thought Content:Illogical; Delusions   History of Schizophrenia/Schizoaffective disorder:Yes   Duration of Psychotic Symptoms:Greater than six months   Hallucinations: VH of "old people". No AH  Ideas of Reference:None   Suicidal Thoughts:denies  Homicidal Thoughts:denies   Sensorium   Memory: Immediate Poor; Recent Poor   Judgment: Impaired   Insight: Poor    Executive Functions  Concentration: Fair   Attention Span: Fair   Recall: [place] of Knowledge: Poor   Language: Poor    Psychomotor Activity  Psychomotor Activity: No data recorded   Assets  Assets: Communication Skills; Desire for Improvement; Physical Health; Social Support    Sleep  Sleep: No data recorded    Physical Exam: Vitals reviewed.  Constitutional:      General: She is not in acute distress.    Appearance: She is normal weight.     Comments: Appears much older than stated age  HENT:     Head: Normocephalic and atraumatic.     Mouth/Throat:     Mouth: Mucous membranes are moist.     Pharynx: Oropharynx is clear.     Comments: Absent teeth Pulmonary:     Effort: Pulmonary effort is normal.  Skin:    General: Skin is warm and dry.  Neurological:     Motor: No weakness.     Gait: Gait normal.  Review of Systems  Constitutional:  Negative for malaise/fatigue.  Respiratory:  Negative for shortness of breath.   Cardiovascular:  Negative for chest pain.  Gastrointestinal: Negative.   Genitourinary: Negative.   Musculoskeletal:  Negative for myalgias.  Neurological:  Negative for headaches.   Blood pressure 106/76, pulse (!) 128, temperature 98 F (36.7 C), temperature source Oral, resp. rate 16, height _0  (1.6 m), weight 53.1 kg, SpO2 99 %. Body mass index is 20.73 kg/m.   Treatment Plan Summary: ASSESSMENT: Principal Problem:   Schizophrenia ([place]) Active Problems:   Tobacco use disorder   History of amphetamine dependence/abuse [facility])  Patient is a 36 year old female with history of schizophrenia who continues to present disorganized in thought process, reporting symptoms inconsistently to different providers.  She remains psychotic, but appears to be tolerating Haldol well.  Patient requires continued inpatient admission for crisis  stabilization, medication optimization, and safety planning, as she has refused to provide consents to speak with family or friends thus far.  Patient appears to be improving in terms of psychosis appearing less disorganized and more coherent.  Patient does have occasional disorganized speech and thought blocking but does appear improved compared to yesterday.      PLAN: Safety and Monitoring: INVOLUTARILY (By [facility]) admission to inpatient psychiatric unit for safety, stabilization and treatment Daily contact with patient to assess and evaluate symptoms and progress in treatment Patient's case to be discussed in multi-disciplinary team meeting Observation Level : q15 minute checks Vital signs: q12 hours Precautions: suicide, elopement, and assault   2. Psychiatric Diagnoses and Treatment # Schizophrenia versus schizoaffective disorder bipolar type - Continue Haldol to 5 mg in AM and 10 mg at night, will continue to titrate as needed  -Did not observe abnormal facial or extremity movements - Continue scheduled Cogentin 0.5 mg twice daily for EPS prophylaxis -- Continue ativan 0.5 mg bid for akathisia, may consider switching to propranolol   PRN: Hydroxyzine 25 mg 3 times daily for anxiety, trazodone 50 mg nightly for sleep, agitation protocol: Geodon 20 mg, Risperdal 2 mg, and Ativan 1 mg   -- The risks/benefits/side-effects/alternatives to this medication were discussed in detail with the patient and time was given for questions. The patient consents to medication trial.              -- Metabolic profile and EKG monitoring obtained while on an atypical antipsychotic  BMI: 20.73 TSH: 1.365 Lipid Panel: WNL HbgA1c: 4.3% QTc: 462             -- Encouraged patient to participate in unit milieu and in scheduled group therapies      3. Medical Issues Being Addressed: #History of hepatitis C ALT, AST, alk phos all WNL -Recommend outpatient follow-up   4. Discharge Planning:               -- Social work and case management to assist with discharge planning and identification of hospital follow-up needs prior to discharge             --  Estimated LOS: 5-7 days             -- Discharge Concerns: Need to establish a safety plan; Medication compliance and effectiveness             -- Discharge Goals: Return home with outpatient referrals for mental health follow-up including medication management/psychotherapy    [name], MD 07/28/2022, 4:04 PM

## 2022-07-29 NOTE — Progress Notes (Signed)

## 2022-07-29 NOTE — [facility] Group Notes (Signed)
  Goals Group 07/29/22   Group Focus: affirmation, clarity of thought, and goals/reality orientation Treatment Modality:  Psychoeducation Interventions utilized were assignment, group exercise, and support Purpose: To be able to understand and verbalize the reason for their admission to the hospital. To understand that the medication helps with their chemical imbalance but they also need to work on their choices in life. To be challenged to develop a list of 30 positives about themselves. Also introduce the concept that "feelings" are not reality.  Participation Level:  did not attend  Neelam Tiggs A 

## 2022-07-29 NOTE — BHH Counselor (Signed)
Clinical Social Work Note  At treatment team request, CSW contacted patient's daughter Adalberto Ill at  [phone] to discuss patient's disposition at the time of discharge.  She stated there are only 2 people involved in patient's life, herself and patient's sister (who can be reached at [phone]).  Sister has determined that the patient cannot return to her home to stay.  Daughter works second shift 2:00pm-11:00pm and does not feel comfortable with the patient being in her home without supervision, therefore states she cannot come there to live.  CSW explained that it will be necessary for the patient to discharge once she is psychiatrically stable.  Although she was disappointed and frustrated, she expressed understanding.  She does not feel the patient can remain healthy if discharged to a shelter.  We discussed options including: patient staying with daughter and going to sister's while daughter is at work psychosocial rehabilitation (which was determined to be without merit due to the hours of operation)\\ Neighbors or friends Group homes (it was explained that this is not an option without a disability income and insurance)  CSW provided education on applying for disability and on seeking guardianship if she thinks that the patient is not capable of making her own decisions.  CSW informed her that the tentative discharge date is Tuesday 11/7.   Selmer Dominion, LCSW 07/29/2022, 4:49 PM

## 2022-07-29 NOTE — [facility] Group Notes (Signed)
sychoEducational group- Goals group. Pt did not attend.

## 2022-07-29 NOTE — Progress Notes (Signed)
Adult Psychoeducational Group Note  Date:  07/29/2022 Time:  9:43 PM  Group Topic/Focus:  Wrap-Up Group:   The focus of this group is to help patients review their daily goal of treatment and discuss progress on daily workbooks.  Participation Level:  Did Not Attend  Participation Quality:   Did Not Attend  Affect:   Did Not Attend  Cognitive:   Did Not Attend  Insight: None  Engagement in Group:   Did Not Attend  Modes of Intervention:   Did Not Attend  Additional Comments:  Pt was encouraged to attend wrap up group but did not attend.  Candy Sledge 07/29/2022, 9:43 PM

## 2022-07-29 NOTE — Progress Notes (Signed)
   07/29/22 0515  Sleep  Number of Hours 7.5

## 2022-07-29 NOTE — Group Note (Signed)
  [facility] LCSW Group Therapy Note  Date/Time:  07/29/2022 11:15AM-12:00PM  Type of Therapy and Topic:  Group Therapy:  Feelings About Hospitalization  Participation Level:  Minimal   Description of Group This process group involved patients discussing their feelings related to being hospitalized, as well as the benefits they see to being in the hospital.  These feelings and benefits were itemized.  The group then brainstormed specific ways in which they could seek those same benefits when they discharge and return home.  Therapeutic Goals Patient will identify and describe positive and negative feelings related to hospitalization Patient will verbalize benefits of hospitalization to themselves personally Patients will brainstorm together ways they can obtain similar benefits in the outpatient setting, identify barriers to wellness and possible solutions  Summary of Patient Progress:  The patient expressed her primary feelings about being hospitalized are "the same" as the person who shared before her (he said he felt good about it but also felt ready to leave).  She then left the group and did not return.  Therapeutic Modalities Cognitive Behavioral Therapy Motivational Interviewing    Selmer Dominion, LCSW 07/29/2022, 1:46 PM

## 2022-07-29 NOTE — Progress Notes (Signed)
[name] MD Progress Note  07/29/2022 7:24 AM Emily [name]  MRN:  [id number]  Reason for Hospitalization:  Emily [name] is a 36 year old female with past psychiatric history of schizophrenia, methamphetamine induced mood disorder, cannabis use disorder, nicotine use disorder, and amphetamine use disorder who presented involuntarily from [facility] ED for psychosis and acute decompensation in the setting of medication noncompliance.  Principal Problem: Schizophrenia ([place]) Diagnosis: Principal Problem:   Schizophrenia ([place]) Active Problems:   Tobacco use disorder   History of amphetamine dependence/abuse ([place])   Chart Review of Past 24 Hours: Per MAR, patient has been compliant with all scheduled medications. Patient did not attend groups yesterday but has not been a behavioral concern. Patient required the following behavioral PRNs: hydroxyzine for anxiety   On Evaluation Today:  Patient seen and assessed at bedside. Patient was sleeping on arrival.  Reports decreased anxiety and restlessness.  Denies SI/HI/AVH.  Continues to be guarded but states she's less paranoid. Reports feeling overall better. Discussed disposition and patient states daughter can take her home. This has not yet been confirmed. Able to say more complete sentences. Patient denies somatic complaints from present medications.  Total Time spent with patient: 20 minutes  Past Psychiatric History: See H&P  Past Medical History:  Past Medical History:  Diagnosis Date   Anxiety    Anxiety    Depression    Schizophrenia ([place])     History reviewed. No pertinent surgical history. Family History: History reviewed. No pertinent family history. Family Psychiatric  History: See H&P Social History:  Social History   Substance and Sexual Activity  Alcohol Use Not Currently   Comment: 2-3 drinks but none recently per pt report     Social History   Substance and Sexual Activity  Drug Use Not Currently   Types:  Methamphetamines   Comment: "SEVERAL MONTHS AGO"    Social History   Socioeconomic History   Marital status: Single    Spouse name: Not on file   Number of children: Not on file   Years of education: Not on file   Highest education level: Not on file  Occupational History   Not on file  Tobacco Use   Smoking status: Every Day    Passive exposure: Past   Smokeless tobacco: Never  Substance and Sexual Activity   Alcohol use: Not Currently    Comment: 2-3 drinks but none recently per pt report   Drug use: Not Currently    Types: Methamphetamines    Comment: "SEVERAL MONTHS AGO"   Sexual activity: Yes    Partners: Male  Other Topics Concern   Not on file  Social History Narrative   Not on file   Social Determinants of Health   Financial Resource Strain: Not on file  Food Insecurity: Not on file  Transportation Needs: Not on file  Physical Activity: Not on file  Stress: Not on file  Social Connections: Not on file   Additional Social History:           Sleep: Good  Appetite:  Good  Current Medications: Current Facility-Administered Medications  Medication Dose Route Frequency Provider Last Rate Last Admin   acetaminophen (TYLENOL) tablet 650 mg  650 mg Oral Q6H PRN Ntuen, Kris Hartmann, FNP   650 mg at 07/26/22 1843   alum & mag hydroxide-simeth (MAALOX/MYLANTA) 200-200-20 MG/5ML suspension 30 mL  30 mL Oral Q4H PRN Ntuen, Tina C, FNP       benztropine (COGENTIN) tablet 0.5 mg  0.5 mg Oral BID [name], MD   0.5 mg at 07/28/22 1657   haloperidol (HALDOL) tablet 10 mg  10 mg Oral Aliene Altes, MD   10 mg at 07/28/22 2035   haloperidol (HALDOL) tablet 5 mg  5 mg Oral Daily [name], MD   5 mg at 07/28/22 0816   hydrOXYzine (ATARAX) tablet 25 mg  25 mg Oral TID PRN [name], FNP   25 mg at 07/28/22 2035   LORazepam (ATIVAN) tablet 1 mg  1 mg Oral Q6H PRN [name], MD       Or   LORazepam (ATIVAN) injection 1 mg  1 mg Intramuscular Q6H PRN [name], MD        LORazepam (ATIVAN) tablet 0.5 mg  0.5 mg Oral BID Winfred [name], Nadir, MD   0.5 mg at 07/28/22 2035   magnesium hydroxide (MILK OF MAGNESIA) suspension 30 mL  30 mL Oral Daily PRN Ntuen, Kris Hartmann, FNP       traZODone (DESYREL) tablet 50 mg  50 mg Oral QHS PRN [name], FNP   50 mg at 07/28/22 2035    Lab Results:  No results found for this or any previous visit (from the past 52 hour(s)).   Blood Alcohol level:  Lab Results  Component Value Date   ETH <10 07/21/2022   ETH <10 87/68/1157    Metabolic Disorder Labs: Lab Results  Component Value Date   HGBA1C 4.3 (L) 07/24/2022   MPG 76.71 07/24/2022   MPG 82.45 04/04/2021   Lab Results  Component Value Date   PROLACTIN 3.5 (L) 04/02/2021   Lab Results  Component Value Date   CHOL 159 07/24/2022   TRIG 51 07/24/2022   HDL 56 07/24/2022   CHOLHDL 2.8 07/24/2022   VLDL 10 07/24/2022   LDLCALC 93 07/24/2022   LDLCALC 104 (H) 04/04/2021    Physical Findings:  Musculoskeletal: Strength & Muscle Tone: within normal limits Gait & Station: normal Patient leans: N/A  Psychiatric Specialty Exam:  Presentation  General Appearance:  Casual; Bizarre; Disheveled (Appears much older than stated age; absence of teeth)   Eye Contact: Fleeting   Speech: Clear and Coherent; Normal Rate   Speech Volume: Normal   Handedness: Right    Mood and Affect  Mood: -- (Okay)   Affect: Constricted    Thought Process  Thought Processes: Goal Directed   Descriptions of Associations:Intact   Orientation:Partial (States name, date, and location.  Does not know her age (responds 66))   Thought Content:Illogical; Delusions   History of Schizophrenia/Schizoaffective disorder:Yes   Duration of Psychotic Symptoms:Greater than six months   Hallucinations: VH of "old people". No AH  Ideas of Reference:None   Suicidal Thoughts:denies  Homicidal Thoughts:denies   Sensorium  Memory: Immediate Poor;  Recent Poor   Judgment: Impaired   Insight: Poor    Executive Functions  Concentration: Fair   Attention Span: Fair   Recall: [place] of Knowledge: Poor   Language: Poor    Psychomotor Activity  Psychomotor Activity: No data recorded   Assets  Assets: Communication Skills; Desire for Improvement; Physical Health; Social Support    Sleep  Sleep: No data recorded    Physical Exam: Vitals reviewed.  Constitutional:      General: She is not in acute distress.    Appearance: She is normal weight.     Comments: Appears much older than stated age  HENT:     Head:  Normocephalic and atraumatic.     Mouth/Throat:     Mouth: Mucous membranes are moist.     Pharynx: Oropharynx is clear.     Comments: Absent teeth Pulmonary:     Effort: Pulmonary effort is normal.  Skin:    General: Skin is warm and dry.  Neurological:     Motor: No weakness.     Gait: Gait normal.  Review of Systems  Constitutional:  Negative for malaise/fatigue.  Respiratory:  Negative for shortness of breath.   Cardiovascular:  Negative for chest pain.  Gastrointestinal: Negative.   Genitourinary: Negative.   Musculoskeletal:  Negative for myalgias.  Neurological:  Negative for headaches.   Blood pressure 97/69, pulse (!) 131, temperature 98.6 F (37 C), temperature source Oral, resp. rate 16, height _0  (1.6 m), weight 53.1 kg, SpO2 99 %. Body mass index is 20.73 kg/m.   Treatment Plan Summary: ASSESSMENT: Principal Problem:   Schizophrenia ([place]) Active Problems:   Tobacco use disorder   History of amphetamine dependence/abuse [facility])  Patient is a 36 year old female with history of schizophrenia who continues to present disorganized in thought process, reporting symptoms inconsistently to different providers.  She remains psychotic, but appears to be tolerating Haldol well.  Patient requires continued inpatient admission for crisis stabilization, medication  optimization, and safety planning, as she has refused to provide consents to speak with family or friends thus far.  Patient appears to be improving in terms of psychosis appearing less disorganized and more coherent.  Patient does have occasional disorganized speech and thought blocking but does appear improved compared to yesterday.      PLAN: Safety and Monitoring: INVOLUTARILY (By [place]) admission to inpatient psychiatric unit for safety, stabilization and treatment Daily contact with patient to assess and evaluate symptoms and progress in treatment Patient's case to be discussed in multi-disciplinary team meeting Observation Level : q15 minute checks Vital signs: q12 hours Precautions: suicide, elopement, and assault   2. Psychiatric Diagnoses and Treatment # Schizophrenia versus schizoaffective disorder bipolar type - Continue Haldol to 5 mg in AM and 10 mg at night, will continue to titrate as needed  -Did not observe abnormal facial or extremity movements - Continue scheduled Cogentin 0.5 mg twice daily for EPS prophylaxis -- Continue ativan 0.5 mg bid for akathisia, may consider switching to propranolol   PRN: Hydroxyzine 25 mg 3 times daily for anxiety, trazodone 50 mg nightly for sleep, agitation protocol: Geodon 20 mg, Risperdal 2 mg, and Ativan 1 mg   -- The risks/benefits/side-effects/alternatives to this medication were discussed in detail with the patient and time was given for questions. The patient consents to medication trial.              -- Metabolic profile and EKG monitoring obtained while on an atypical antipsychotic  BMI: 20.73 TSH: 1.365 Lipid Panel: WNL HbgA1c: 4.3% QTc: 462             -- Encouraged patient to participate in unit milieu and in scheduled group therapies      3. Medical Issues Being Addressed: #History of hepatitis C ALT, AST, alk phos all WNL -Recommend outpatient follow-up   4. Discharge Planning:              -- Social work  and case management to assist with discharge planning and identification of hospital follow-up needs prior to discharge             -- Estimated LOS: 5-7 days             --  Discharge Concerns: Need to establish a safety plan; Medication compliance and effectiveness             -- Discharge Goals: Return home with outpatient referrals for mental health follow-up including medication management/psychotherapy    [name], MD 07/29/2022, 7:24 AM

## 2022-07-29 NOTE — Progress Notes (Signed)
   07/29/22 2000  Psych Admission Type (Psych Patients Only)  Admission Status Involuntary  Psychosocial Assessment  Patient Complaints Anxiety;Depression  Eye Contact Brief  Facial Expression Flat  Affect Depressed  Speech Logical/coherent  Interaction Cautious  Motor Activity Fidgety  Appearance/Hygiene Improved  Behavior Characteristics Cooperative;Guarded  Mood Depressed  Aggressive Behavior  Effect No apparent injury  Thought Process  Coherency Concrete thinking  Content WDL  Delusions None reported or observed  Perception Hallucinations  Hallucination Auditory  Judgment Impaired  Confusion None  Danger to Self  Current suicidal ideation? Denies  Danger to Others  Danger to Others None reported or observed

## 2022-07-30 NOTE — Group Note (Signed)
[facility] LCSW Group Therapy Note  Date/Time:  07/30/2022  11:00AM-12:00PM  Type of Therapy and Topic:  Group Therapy:  Music and Mood  Participation Level:  Did Not Attend   Description of Group: In this process group, members listened to a variety of genres of music and identified that different types of music evoke different responses.  Patients were encouraged to identify music that was soothing for them and music that was energizing for them.  Patients discussed how this knowledge can help with wellness and recovery in various ways including managing depression and anxiety as well as encouraging healthy sleep habits.    Therapeutic Goals: Patients will explore the impact of different varieties of music on mood Patients will verbalize the thoughts they have when listening to different types of music Patients will identify music that is soothing to them as well as music that is energizing to them Patients will discuss how to use this knowledge to assist in maintaining wellness and recovery Patients will explore the use of music as a coping skill  Summary of Patient Progress:  Patient was invited to group, did not attend.   Therapeutic Modalities: Solution Focused Brief Therapy Activity   Clarann Helvey Grossman-Orr, LCSW   

## 2022-07-30 NOTE — Progress Notes (Addendum)
[facility] MD Progress Note  07/30/2022 9:51 AM Emily [name]  MRN:  [id number]  Reason for Hospitalization:  Emily [name] is a 36 year old female with past psychiatric history of schizophrenia, methamphetamine induced mood disorder, cannabis use disorder, nicotine use disorder, and amphetamine use disorder who presented involuntarily from [facility] ED for psychosis and acute decompensation in the setting of medication noncompliance.  Principal Problem: Schizophrenia ([place]) Diagnosis: Principal Problem:   Schizophrenia ([place]) Active Problems:   Tobacco use disorder   History of amphetamine dependence/abuse ([place])   Chart Review of Past 24 Hours: Per MAR, patient has been compliant with all scheduled medications. Patient did not attend groups yesterday but has not been a behavioral concern. Patient required the following behavioral PRNs: Trazodone for sleep   On Evaluation Today:  Patient seen and assessed at bedside. Patient was down but awake upon arrival.  She continues to report decreased anxiety and restlessness, overall feeling much better.  She remains more linear and with increased spontaneous speech on assessment.  She denies SI/HI/AVH/paranoia on the unit.  She denies anticipating paranoia upon discharge from the hospital.  Again discussed disposition and patient states that she would like her daughter to take her to her friend's home in [place], which she states she will confirm with her daughter. Patient denies somatic complaints from present medications.  Total Time spent with patient: 20 minutes  Past Psychiatric History: See H&P  Past Medical History:  Past Medical History:  Diagnosis Date   Anxiety    Anxiety    Depression    Schizophrenia ([place])     History reviewed. No pertinent surgical history. Family History: History reviewed. No pertinent family history. Family Psychiatric  History: See H&P Social History:  Social History   Substance and Sexual Activity  Alcohol  Use Not Currently   Comment: 2-3 drinks but none recently per pt report     Social History   Substance and Sexual Activity  Drug Use Not Currently   Types: Methamphetamines   Comment: "SEVERAL MONTHS AGO"    Social History   Socioeconomic History   Marital status: Single    Spouse name: Not on file   Number of children: Not on file   Years of education: Not on file   Highest education level: Not on file  Occupational History   Not on file  Tobacco Use   Smoking status: Every Day    Passive exposure: Past   Smokeless tobacco: Never  Substance and Sexual Activity   Alcohol use: Not Currently    Comment: 2-3 drinks but none recently per pt report   Drug use: Not Currently    Types: Methamphetamines    Comment: "SEVERAL MONTHS AGO"   Sexual activity: Yes    Partners: Male  Other Topics Concern   Not on file  Social History Narrative   Not on file   Social Determinants of Health   Financial Resource Strain: Not on file  Food Insecurity: Not on file  Transportation Needs: Not on file  Physical Activity: Not on file  Stress: Not on file  Social Connections: Not on file   Additional Social History:           Sleep: Good  Appetite:  Good  Current Medications: Current Facility-Administered Medications  Medication Dose Route Frequency Provider Last Rate Last Admin   acetaminophen (TYLENOL) tablet 650 mg  650 mg Oral Q6H PRN Ntuen, Kris Hartmann, FNP   650 mg at 07/29/22 1605   alum &  mag hydroxide-simeth (MAALOX/MYLANTA) 200-200-20 MG/5ML suspension 30 mL  30 mL Oral Q4H PRN Ntuen, Kris Hartmann, FNP       benztropine (COGENTIN) tablet 0.5 mg  0.5 mg Oral BID [name], MD   0.5 mg at 07/30/22 0839   haloperidol (HALDOL) tablet 10 mg  10 mg Oral Aliene Altes, MD   10 mg at 07/29/22 2035   haloperidol (HALDOL) tablet 5 mg  5 mg Oral Daily [name], MD   5 mg at 07/30/22 7001   hydrOXYzine (ATARAX) tablet 25 mg  25 mg Oral TID PRN [name], FNP   25 mg at 07/28/22 2035    LORazepam (ATIVAN) tablet 1 mg  1 mg Oral Q6H PRN [name], MD       Or   LORazepam (ATIVAN) injection 1 mg  1 mg Intramuscular Q6H PRN [name], MD       LORazepam (ATIVAN) tablet 0.5 mg  0.5 mg Oral BID [name], Nadir, MD   0.5 mg at 07/30/22 0840   magnesium hydroxide (MILK OF MAGNESIA) suspension 30 mL  30 mL Oral Daily PRN Ntuen, Kris Hartmann, FNP       traZODone (DESYREL) tablet 50 mg  50 mg Oral QHS PRN [name], FNP   50 mg at 07/29/22 2034    Lab Results:  No results found for this or any previous visit (from the past 61 hour(s)).   Blood Alcohol level:  Lab Results  Component Value Date   ETH <10 07/21/2022   ETH <10 74/94/4967    Metabolic Disorder Labs: Lab Results  Component Value Date   HGBA1C 4.3 (L) 07/24/2022   MPG 76.71 07/24/2022   MPG 82.45 04/04/2021   Lab Results  Component Value Date   PROLACTIN 3.5 (L) 04/02/2021   Lab Results  Component Value Date   CHOL 159 07/24/2022   TRIG 51 07/24/2022   HDL 56 07/24/2022   CHOLHDL 2.8 07/24/2022   VLDL 10 07/24/2022   LDLCALC 93 07/24/2022   LDLCALC 104 (H) 04/04/2021    Physical Findings:  Musculoskeletal: Strength & Muscle Tone: within normal limits Gait & Station: normal Patient leans: N/A  Psychiatric Specialty Exam:  Presentation  General Appearance:  Appropriate for Environment; Casual; Fairly Groomed   Eye Contact: Good   Speech: Clear and Coherent; Normal Rate   Speech Volume: Normal   Handedness: Right    Mood and Affect  Mood: Euthymic   Affect: Congruent; Appropriate    Thought Process  Thought Processes: Goal Directed; Linear   Descriptions of Associations:Intact   Orientation:Full (Time, Place and Person)   Thought Content:Logical; WDL   History of Schizophrenia/Schizoaffective disorder:Yes   Duration of Psychotic Symptoms:Greater than six months   Hallucinations: VH of "old people". No AH  Ideas of Reference:None   Suicidal  Thoughts:denies  Homicidal Thoughts:denies   Sensorium  Memory: Immediate Fair; Recent Fair   Judgment: Fair   Insight: Fair    Executive Functions  Concentration: Good   Attention Span: Good   Recall: Good   Fund of Knowledge: Fair   Language: Fair    Psychomotor Activity  Psychomotor Activity: Psychomotor Activity: Normal    Assets  Assets: Communication Skills; Desire for Improvement; Leisure Time; Physical Health; Social Support    Sleep  Sleep: Sleep: Good     Physical Exam: Vitals reviewed.  Constitutional:      General: She is not in acute distress.    Appearance: She is normal  weight.     Comments: Appears much older than stated age  HENT:     Head: Normocephalic and atraumatic.     Mouth/Throat:     Mouth: Mucous membranes are moist.     Pharynx: Oropharynx is clear.     Comments: Absent teeth Pulmonary:     Effort: Pulmonary effort is normal.  Skin:    General: Skin is warm and dry.  Neurological:     Motor: No weakness.     Gait: Gait normal.  Review of Systems  Constitutional:  Negative for malaise/fatigue.  Respiratory:  Negative for shortness of breath.   Cardiovascular:  Negative for chest pain.  Gastrointestinal: Negative.   Genitourinary: Negative.   Musculoskeletal:  Negative for myalgias.  Neurological:  Negative for headaches.   Blood pressure (!) 80/57, pulse (!) 128, temperature 99.5 F (37.5 C), temperature source Oral, resp. rate 16, height _0  (1.6 m), weight 53.1 kg, SpO2 98 %. Body mass index is 20.73 kg/m.   Treatment Plan Summary: ASSESSMENT: Principal Problem:   Schizophrenia ([place]) Active Problems:   Tobacco use disorder   History of amphetamine dependence/abuse [facility])  Patient is a 36 year old female with history of schizophrenia who initially presented disorganized in thought process, reporting symptoms inconsistently to different providers.  She is no longer endorsing psychotic  symptoms, tolerating Haldol well.  As well, with the addition of Ativan, patient is far less restless and without pacing.  Patient feels ready to discharge tomorrow.      PLAN: Safety and Monitoring: INVOLUTARILY (By [place]) admission to inpatient psychiatric unit for safety, stabilization and treatment Daily contact with patient to assess and evaluate symptoms and progress in treatment Patient's case to be discussed in multi-disciplinary team meeting Observation Level : q15 minute checks Vital signs: q12 hours Precautions: suicide, elopement, and assault   2. Psychiatric Diagnoses and Treatment # Schizophrenia versus schizoaffective disorder bipolar type - Continue Haldol to 5 mg in AM and 10 mg at night, will continue to titrate as needed  -Did not observe abnormal facial or extremity movements - Continue scheduled Cogentin 0.5 mg twice daily for EPS prophylaxis -- Continue ativan 0.5 mg bid for akathisia, may consider switching to propranolol; although patient with hypotensive or borderline hypotensive blood pressures for the past 2 days.  We will continue to monitor throughout the day.   PRN: Hydroxyzine 25 mg 3 times daily for anxiety, trazodone 50 mg nightly for sleep, agitation protocol: Geodon 20 mg, Risperdal 2 mg, and Ativan 1 mg   -- The risks/benefits/side-effects/alternatives to this medication were discussed in detail with the patient and time was given for questions. The patient consents to medication trial.              -- Metabolic profile and EKG monitoring obtained while on an atypical antipsychotic  BMI: 20.73 TSH: 1.365 Lipid Panel: WNL HbgA1c: 4.3% QTc: 462             -- Encouraged patient to participate in unit milieu and in scheduled group therapies      3. Medical Issues Being Addressed: #History of hepatitis C ALT, AST, alk phos all WNL -Recommend outpatient follow-up   4. Discharge Planning:              -- Social work and case management to  assist with discharge planning and identification of hospital follow-up needs prior to discharge             -- Estimated LOS:  5-7 days             -- Discharge Concerns: Need to establish a safety plan; Medication compliance and effectiveness             -- Discharge Goals: Return home with outpatient referrals for mental health follow-up including medication management/psychotherapy    Rosezetta Schlatter, MD 07/30/2022, 9:51 AM

## 2022-07-30 NOTE — Plan of Care (Signed)
Alert, oriented and able to make needs known. Patient has been up and down today. Denies SI/HI/AVH. Took her medications appropriately. Denies any pain. Pt is safe on unit.      Problem: Education: Goal: Mental status will improve Outcome: Progressing   Problem: Activity: Goal: Interest or engagement in activities will improve Outcome: Progressing Goal: Sleeping patterns will improve Outcome: Progressing   Problem: Coping: Goal: Ability to verbalize frustrations and anger appropriately will improve Outcome: Progressing Goal: Ability to demonstrate self-control will improve Outcome: Progressing

## 2022-07-30 NOTE — [facility] Group Notes (Signed)
Adult Psychoeducational Group Note Date:  07/30/2022 Time:  0900-1000 Group Topic/Focus: PROGRESSIVE RELAXATION. [name] group where deep breathing is taught and tensing and relaxation muscle groups is used. Imagery is used as well.  Pts are asked to imagine 3 pillars that hold them up when they are not able to hold themselves up and to share that with the group.   Participation Level:  did not attend   : Emily [name]   

## 2022-07-30 NOTE — Progress Notes (Signed)
   07/30/22 0500  Sleep  Number of Hours 8.5

## 2022-07-30 NOTE — Progress Notes (Signed)
Adult Psychoeducational Group Note  Date:  07/30/2022 Time:  8:41 PM  Group Topic/Focus:  Wrap-Up Group:   The focus of this group is to help patients review their daily goal of treatment and discuss progress on daily workbooks.  Participation Level:  Did Not Attend  Participation Quality:   n/a  Affect:   n/a  Cognitive:   n/a  Insight: None  Engagement in Group:   n/a  Modes of Intervention:   n/a  Additional Comments:   Pt did not attend the Wrap Up group.  Wetzel Bjornstad Yamilette Garretson 07/30/2022, 8:41 PM

## 2022-07-31 MED ORDER — BENZTROPINE MESYLATE 0.5 MG PO TABS: 0.5000 mg | ORAL_TABLET | Freq: Two times a day (BID) | ORAL | 0 refills | Status: DC

## 2022-07-31 MED ORDER — HALOPERIDOL 5 MG PO TABS: ORAL_TABLET | ORAL | 0 refills | Status: AC

## 2022-07-31 MED ORDER — LORAZEPAM 0.5 MG PO TABS: 0.5000 mg | ORAL_TABLET | Freq: Two times a day (BID) | ORAL | 0 refills | Status: AC

## 2022-07-31 NOTE — Progress Notes (Signed)
  [facility] Adult Case Management Discharge Plan :  Will you be returning to the same living situation after discharge:  No, shelter  At discharge, do you have transportation home?: Yes,  Daughter is picking up patient  Do you have the ability to pay for your medications: Yes,  provided discount card and instructions on FU for services to obtain medication without insurance.   Release of information consent forms completed and in the chart;  Patient's signature needed at discharge.  Patient to Follow up at:  Follow-up Information     Services, [facility] Follow up.   Why: You may go to this provider for medication managemernt services and group therapy services. Contact information: [place] [address] [phone]                 Next level of care provider has access to [place] and Suicide Prevention discussed: Yes,  Daughter      Has patient been referred to the Quitline?: Yes, faxed on 07/31/2022  Patient has been referred for addiction treatment: N/A  Sherre Lain, [place] 07/31/2022, 10:16 AM

## 2022-07-31 NOTE — BHH Suicide Risk Assessment (Signed)
[facility] Discharge Suicide Risk Assessment   Principal Problem: Schizophrenia [facility]) Discharge Diagnoses: Principal Problem:   Schizophrenia ([place]) Active Problems:   Tobacco use disorder   History of amphetamine dependence/abuse ([place])   Reason for Admission: Emily [name] is a 36 year old female with past psychiatric history of schizophrenia, methamphetamine induced mood disorder, cannabis use disorder, nicotine use disorder, and amphetamine use disorder who presented involuntarily from [facility] ED for psychosis and acute decompensation in the setting of medication noncompliance.   Hospital Summary During the patient's hospitalization, patient had extensive initial psychiatric evaluation, and follow-up psychiatric evaluations every day.  Psychiatric diagnoses provided upon initial assessment:  Schizophrenia  Patient's psychiatric medications were adjusted on admission:  - Increase to Haldol 2.5 mg twice daily, to begin tonight - Continue previously prescribed Zoloft 50 mg daily - Adjusted to Cogentin 0.5 mg twice daily as needed  During the hospitalization, other adjustments were made to the patient's psychiatric medication regimen:  - Increase Haldol to 5 mg in AM and 10 mg at night, will continue to titrate as needed - Continue scheduled Cogentin 0.5 mg twice daily for EPS prophylaxis -- Continue ativan 0.5 mg bid for akathisia  Gradually, patient started adjusting to milieu.   Patient's care was discussed during the interdisciplinary team meeting every day during the hospitalization.  The patient denies having side effects to prescribed psychiatric medication.  The patient reports their target psychiatric symptoms of psychosis responded well to the psychiatric medications, and the patient reports overall benefit other psychiatric hospitalization. Supportive psychotherapy was provided to the patient. The patient also participated in regular group therapy while admitted.   Labs were  reviewed with the patient, and abnormal results were discussed with the patient.  The patient denied having suicidal thoughts more than 48 hours prior to discharge.  Patient denies having homicidal thoughts.  Patient denies having auditory hallucinations.  Patient denies any visual hallucinations.  Patient denies having paranoid thoughts.  The patient is able to verbalize their individual safety plan to this provider.  It is recommended to the patient to continue psychiatric medications as prescribed, after discharge from the hospital.    It is recommended to the patient to follow up with your outpatient psychiatric provider and PCP.  Discussed with the patient, the impact of alcohol, drugs, tobacco have been there overall psychiatric and medical wellbeing, and total abstinence from substance use was recommended the patient.   Total Time spent with patient: 45 minutes  Musculoskeletal: Strength & Muscle Tone: within normal limits Gait & Station: normal Patient leans: N/A  Psychiatric Specialty Exam  Presentation  General Appearance: Appropriate for Environment; Casual; Fairly Groomed   Eye Contact:Good   Speech:Clear and Coherent; Normal Rate   Speech Volume:Normal   Handedness:Right    Mood and Affect  Mood:Euthymic   Duration of Depression Symptoms: No data recorded  Affect:Congruent; Appropriate    Thought Process  Thought Processes:Goal Directed; Linear   Descriptions of Associations:Intact   Orientation:Full (Time, Place and Person)   Thought Content:Logical; WDL   History of Schizophrenia/Schizoaffective disorder:Yes   Duration of Psychotic Symptoms:Greater than six months   Hallucinations:Hallucinations: None  Ideas of Reference:None   Suicidal Thoughts:Suicidal Thoughts: No  Homicidal Thoughts:Homicidal Thoughts: No   Sensorium  Memory:Immediate Fair; Recent Fair   Judgment:Fair   Insight:Fair    Executive Functions   Concentration:Good   Attention Span:Good   Recall:Good   Fund of Knowledge:Fair   Language:Fair    Psychomotor Activity  Psychomotor Activity:Psychomotor Activity: Normal  Assets  Assets:Communication Skills; Desire for Improvement; Leisure Time; Physical Health; Social Support    Sleep  Sleep:Sleep: Good   Physical Exam: Physical Exam Vitals and nursing note reviewed.  Constitutional:      Appearance: Normal appearance. She is normal weight.  HENT:     Head: Normocephalic and atraumatic.  Pulmonary:     Effort: Pulmonary effort is normal.  Neurological:     General: No focal deficit present.     Mental Status: She is oriented to person, place, and time.    Review of Systems  Respiratory:  Negative for shortness of breath.   Cardiovascular:  Negative for chest pain.  Gastrointestinal:  Negative for abdominal pain, constipation, diarrhea, heartburn, nausea and vomiting.  Neurological:  Negative for headaches.   Blood pressure (!) 86/73, pulse (!) 118, temperature 97.7 F (36.5 C), temperature source Oral, resp. rate 16, height 5\' 3"  (1.6 m), weight 53.1 kg, SpO2 100 %. Body mass index is 20.73 kg/m.  Mental Status Per Nursing Assessment::   On Admission:  NA  Demographic Factors:  Caucasian, Living alone, and Unemployed  Loss Factors: NA  Historical Factors: Impulsivity  Risk Reduction Factors:   Positive social support, Positive therapeutic relationship, and Positive coping skills or problem solving skills  Continued Clinical Symptoms:  Schizophrenia:   Less than 30 years old  Cognitive Features That Contribute To Risk:  None    Suicide Risk:  Minimal: No identifiable suicidal ideation.  Patients presenting with no risk factors but with morbid ruminations; may be classified as minimal risk based on the severity of the depressive symptoms   Follow-up Information     Services, [facility] Follow up.   Why: You may go to this  provider for medication managemernt services and group therapy services. Contact information: [address] [place] [phone]                 Plan Of Care/Follow-up recommendations:  Activity: as tolerated  Diet: heart healthy  Other: -Follow-up with your outpatient psychiatric provider -instructions on appointment date, time, and address (location) are provided to you in discharge paperwork.  -Take your psychiatric medications as prescribed at discharge - instructions are provided to you in the discharge paperwork  -Recommend abstinence from alcohol, tobacco, and other illicit drug use at discharge.   -If your psychiatric symptoms recur, worsen, or if you have side effects to your psychiatric medications, call your outpatient psychiatric provider, 911, [phone] or go to the nearest emergency department.  -If suicidal thoughts recur, call your outpatient psychiatric provider, 911, [phone] or go to the nearest emergency department.   [phone], MD 07/31/2022, 10:46 AM

## 2022-07-31 NOTE — Progress Notes (Signed)
D: Pt A & O X 3. Denies SI, HI, AVH and pain at this time. D/C home as ordered. Picked up in lobby by her daughter.  A: D/C instructions reviewed with pt including prescriptions, medication samples and follow up appointment, compliance encouraged. All belongings from assigned locker given to pt at time of departure. Scheduled medications given with verbal education and effects monitored. Safety checks maintained without incident till time of d/c.  R: Pt receptive to care. Compliant with medications when offered. Denies adverse drug reactions when assessed. Verbalized understanding related to d/c instructions. Signed belonging sheet in agreement with items received from locker. Ambulatory with a steady gait. Appears to be in no physical distress at time of departure.

## 2022-07-31 NOTE — Plan of Care (Signed)
Patient was unable to identify 3 positive coping skills at the completion of recreation therapy group sessions.  Patient appeared to be unable to follow along during group sessions and gave little to no effort.   Jerriann Schrom-McCall, LRT,CTRS

## 2022-07-31 NOTE — Discharge Summary (Addendum)
Physician Discharge Summary Note  Patient:  Emily [name] is an 36 y.o., female MRN:  [id number] DOB:  Feb 20, 1986 Patient phone:  [phone] (home)  Patient address:   [address] [place] [address],  Total Time spent with patient: 45 minutes  Date of Admission:  07/23/2022 Date of Discharge: 07/31/2022  Reason for Admission:  Emily [name] is a 36 year old female with past psychiatric history of schizophrenia, methamphetamine induced mood disorder, cannabis use disorder, nicotine use disorder, and amphetamine use disorder who presented involuntarily from [facility] ED for psychosis and acute decompensation in the setting of medication noncompliance.    Principal Problem: Chronic paranoid schizophrenia [facility]) Discharge Diagnoses: Principal Problem:   Chronic paranoid schizophrenia ([place]) Active Problems:   Tobacco use disorder   History of amphetamine dependence/abuse ([place])    Past Psychiatric History:  Previous Psych Diagnoses: Schizophrenia, schizoaffective disorder bipolar type, substance-induced mood disorder, opioid dependence, Prior inpatient treatment: [facility] July 2022, [facility] October 2017, [facility] October 2015 and November 2009 Prior outpatient treatment: History of suicide: Per prior documentation, history of cutting History of homicide: None documented Psychiatric medication history: Zyprexa 5 mg, gabapentin 100 mg twice daily, hydroxyzine 25 mg 3 times daily as needed anxiety Psychiatric medication compliance history: Noncompliant Neuromodulation history: None documented Current Psychiatrist: None documented Current therapist: None documented  Past Medical History:  Past Medical History:  Diagnosis Date   Anxiety    Anxiety    Depression    Schizophrenia ([place])    History reviewed. No pertinent surgical history. Family History: History reviewed. No pertinent family history. Family Psychiatric  History:  Medical, psychiatric: Documentation reveals none per  patient's daughter  Social History:  Social History   Substance and Sexual Activity  Alcohol Use Not Currently   Comment: 2-3 drinks but none recently per pt report     Social History   Substance and Sexual Activity  Drug Use Not Currently   Types: Methamphetamines   Comment: "SEVERAL MONTHS AGO"    Social History   Socioeconomic History   Marital status: Single    Spouse name: Not on file   Number of children: Not on file   Years of education: Not on file   Highest education level: Not on file  Occupational History   Not on file  Tobacco Use   Smoking status: Every Day    Passive exposure: Past   Smokeless tobacco: Never  Substance and Sexual Activity   Alcohol use: Not Currently    Comment: 2-3 drinks but none recently per pt report   Drug use: Not Currently    Types: Methamphetamines    Comment: "SEVERAL MONTHS AGO"   Sexual activity: Yes    Partners: Male  Other Topics Concern   Not on file  Social History Narrative   Not on file   Social Determinants of Health   Financial Resource Strain: Not on file  Food Insecurity: Not on file  Transportation Needs: Not on file  Physical Activity: Not on file  Stress: Not on file  Social Connections: Not on file    Hospital Course:   During the patient's hospitalization, patient had extensive initial psychiatric evaluation, and follow-up psychiatric evaluations every day.   Psychiatric diagnoses provided upon initial assessment:  Schizophrenia   Patient's psychiatric medications were adjusted on admission:  - Increase to Haldol 2.5 mg twice daily, to begin tonight - Continue previously prescribed Zoloft 50 mg daily - Adjusted to Cogentin 0.5 mg twice daily as needed  During the hospitalization, other adjustments were made to the patient's psychiatric medication regimen:  - Increase Haldol to 5 mg in AM and 10 mg at night, will continue to titrate as needed - Continue scheduled Cogentin 0.5 mg twice daily for  EPS prophylaxis -- Continue ativan 0.5 mg bid for akathisia   Gradually, patient started adjusting to milieu.   Patient's care was discussed during the interdisciplinary team meeting every day during the hospitalization.   The patient denies having side effects to prescribed psychiatric medication.   The patient reports their target psychiatric symptoms of psychosis responded well to the psychiatric medications, and the patient reports overall benefit other psychiatric hospitalization. Supportive psychotherapy was provided to the patient. The patient also participated in regular group therapy while admitted.    Labs were reviewed with the patient, and abnormal results were discussed with the patient.   The patient denied having suicidal thoughts more than 48 hours prior to discharge.  Patient denies having homicidal thoughts.  Patient denies having auditory hallucinations.  Patient denies any visual hallucinations.  Patient denies having paranoid thoughts.   The patient is able to verbalize their individual safety plan to this provider.   It is recommended to the patient to continue psychiatric medications as prescribed, after discharge from the hospital.     It is recommended to the patient to follow up with your outpatient psychiatric provider and PCP.   Discussed with the patient, the impact of alcohol, drugs, tobacco have been there overall psychiatric and medical wellbeing, and total abstinence from substance use was recommended the patient.  Physical Findings: AIMS: 0  Musculoskeletal: Strength & Muscle Tone: within normal limits Gait & Station: normal Patient leans: N/A   Psychiatric Specialty Exam:  Presentation  General Appearance:  Appropriate for Environment; Casual; Fairly Groomed   Eye Contact: Good   Speech: Clear and Coherent; Normal Rate   Speech Volume: Normal   Handedness: Right    Mood and Affect  Mood: Euthymic   Affect: Congruent;  Appropriate    Thought Process  Thought Processes: Goal Directed; Linear   Descriptions of Associations:Intact   Orientation:Full (Time, Place and Person)   Thought Content:Logical; WDL   History of Schizophrenia/Schizoaffective disorder:Yes   Duration of Psychotic Symptoms:Greater than six months   Hallucinations:No data recorded   Ideas of Reference:None   Suicidal Thoughts:No data recorded   Homicidal Thoughts:No data recorded    Sensorium  Memory: Immediate Fair; Recent Fair   Judgment: Fair   Insight: Fair    Art therapist  Concentration: Good   Attention Span: Good   Recall: Good   Fund of Knowledge: Fair   Language: Fair    Psychomotor Activity  Psychomotor Activity: No data recorded    Assets  Assets: Communication Skills; Desire for Improvement; Leisure Time; Physical Health; Social Support    Sleep  Sleep: No data recorded     Physical Exam: Physical Exam ROS Blood pressure 103/78, pulse 100, temperature 97.7 F (36.5 C), temperature source Oral, resp. rate 16, height 5\' 3"  (1.6 m), weight 53.1 kg, SpO2 100 %. Body mass index is 20.73 kg/m.   Social History   Tobacco Use  Smoking Status Every Day   Passive exposure: Past  Smokeless Tobacco Never   Tobacco Cessation:  A prescription for an FDA-approved tobacco cessation medication was offered at discharge and the patient refused   Blood Alcohol level:  Lab Results  Component Value Date   ETH <10 07/21/2022   ETH <10  02/09/2022    Metabolic Disorder Labs:  Lab Results  Component Value Date   HGBA1C 4.3 (L) 07/24/2022   MPG 76.71 07/24/2022   MPG 82.45 04/04/2021   Lab Results  Component Value Date   PROLACTIN 3.5 (L) 04/02/2021   Lab Results  Component Value Date   CHOL 159 07/24/2022   TRIG 51 07/24/2022   HDL 56 07/24/2022   CHOLHDL 2.8 07/24/2022   VLDL 10 07/24/2022   LDLCALC 93 07/24/2022   LDLCALC 104 (H)  04/04/2021    See Psychiatric Specialty Exam and Suicide Risk Assessment completed by Attending Physician prior to discharge.  Discharge destination:  Home  Is patient on multiple antipsychotic therapies at discharge:  No   Has Patient had three or more failed trials of antipsychotic monotherapy by history:  No  Recommended Plan for Multiple Antipsychotic Therapies: NA  Discharge Instructions     Diet - low sodium heart healthy   Complete by: As directed    Discharge instructions   Complete by: As directed    Take all medications as prescribed by his/her mental healthcare provider. Report any adverse effects and or reactions from the medicines to your outpatient provider promptly. Do not engage in alcohol and or illegal drug use while on prescription medicines. In the event of worsening symptoms, call the crisis hotline, 911 and or go to the nearest ED for appropriate evaluation and treatment of symptoms. follow-up with your primary care provider for your other medical issues, concerns and or health care needs.   Increase activity slowly   Complete by: As directed       Allergies as of 07/31/2022       Reactions   Bee Venom Hives        Medication List     TAKE these medications      Indication  benztropine 0.5 MG tablet Commonly known as: COGENTIN Take 1 tablet (0.5 mg total) by mouth 2 (two) times daily.  Indication: Extrapyramidal Reaction caused by Medications   haloperidol 5 MG tablet Commonly known as: HALDOL Take one tablet in the morning and 2 in the evening  Indication: Psychosis   LORazepam 0.5 MG tablet Commonly known as: ATIVAN Take 1 tablet (0.5 mg total) by mouth 2 (two) times daily for 15 days.  Indication: Schizophrenia        Follow-up Information     Services, [facility] Follow up.   Why: You may go to this provider for medication managemernt services and group therapy services. Contact information: [address] [place] [address] [phone]                  Follow-up recommendations:   Activity:  as tolerated Diet:  heart healthy   Comments:  Prescriptions were given at discharge.  Patient is agreeable with the discharge plan.  Patient was given an opportunity to ask questions.  Patient appears to feel comfortable with discharge and denies any current suicidal or homicidal thoughts.    Patient is instructed prior to discharge to: Take all medications as prescribed by mental healthcare provider. Report any adverse effects and or reactions from the medicines to outpatient provider promptly. In the event of worsening symptoms, patient is instructed to call the crisis hotline, 911 and or go to the nearest ED for appropriate evaluation and treatment of symptoms. Patient is to follow-up with primary care provider for other medical issues, concerns and or health care needs.   Signed: Sarita Bottom, MD  08/02/2022, 1:55 PM

## 2022-07-31 NOTE — Progress Notes (Signed)
   07/31/22 0600  Sleep  Number of Hours 7.5

## 2022-07-31 NOTE — Progress Notes (Signed)
Recreation Therapy Notes  INPATIENT RECREATION TR PLAN  Patient Details Name: Emily [name] MRN: [id number] DOB: [birth year] Today's Date: 07/31/2022  Rec Therapy Plan Is patient appropriate for Therapeutic Recreation?: Yes Treatment times per week: about 3 days Estimated Length of Stay: 5-7 days TR Treatment/Interventions: Group participation (Comment)  Discharge Criteria Pt will be discharged from therapy if:: Discharged Treatment plan/goals/alternatives discussed and agreed upon by:: Patient/family  Discharge Summary Short term goals set: See patient care plan Short term goals met: Not met Progress toward goals comments: Groups attended Which groups?: Self-esteem, Coping skills, Other (Comment) (Decision Making; Coping Skills) Reason goals not met: Pt seemed unable to follow along in groups and gave very little effort. Therapeutic equipment acquired: N/A Reason patient discharged from therapy: Discharge from hospital Pt/family agrees with progress & goals achieved: Yes Date patient discharged from therapy: 07/31/22    Briane Birden-McCall, LRT,CTRS Ellory Khurana A Conna Terada-McCall 07/31/2022, 12:44 PM

## 2022-07-31 NOTE — Plan of Care (Signed)
Pt d/c home with daughter

## 2022-07-31 NOTE — Progress Notes (Signed)
   07/31/22 0200  Psych Admission Type (Psych Patients Only)  Admission Status Involuntary  Psychosocial Assessment  Patient Complaints None  Eye Contact Brief  Facial Expression Flat  Affect Depressed  Speech Logical/coherent  Interaction Minimal  Motor Activity Fidgety  Appearance/Hygiene Improved  Behavior Characteristics Cooperative  Mood Anxious  Aggressive Behavior  Effect No apparent injury  Thought Process  Coherency Concrete thinking  Content WDL  Delusions None reported or observed  Perception WDL  Hallucination None reported or observed  Judgment Impaired  Confusion None  Danger to Self  Current suicidal ideation? Denies  Danger to Others  Danger to Others None reported or observed

## 2022-07-31 NOTE — Group Note (Signed)
Recreation Therapy Group Note   Group Topic:Communication  Group Date: 07/31/2022 Start Time: 1000 End Time: 1035 Facilitators: Emily [name], LRT,CTRS Location: [address]   Goal Area(s) Addresses:  Patient will effectively listen to complete activity.  Patient will identify communication skills used to make activity successful.  Patient will identify how skills used during activity can be used to reach post d/c goals.    Group Description:  Geometric Drawings.  Three volunteers from the peer group will be shown an abstract picture with a particular arrangement of geometrical shapes.  Each round, one 'speaker' will describe the pattern, as accurately as possible without revealing the image to the group.  The remaining group members will listen and draw the picture to reflect how it is described to them. Patients with the role of 'listener' cannot ask clarifying questions but, may request that the speaker repeat a direction. Once the drawings are complete, the presenter will show the rest of the group the picture and compare how close each person came to drawing the picture. LRT will facilitate a post-activity discussion regarding effective communication and the importance of planning, listening, and asking for clarification in daily interactions with others.   Affect/Mood: N/A   Participation Level: Did not attend    Clinical Observations/Individualized Feedback:     Plan: Continue to engage patient in RT group sessions 2-3x/week.   Emily [name], LRT,CTRS 07/31/2022 12:18 PM

## 2022-09-28 ENCOUNTER — Ambulatory Visit (HOSPITAL_COMMUNITY)
Admission: EM | Admit: 2022-09-28 | Discharge: 2022-09-28 | Disposition: A | Discharge status: Home / Self Care | Payer: Medicaid Other | Attending: Psychiatry | Admitting: Psychiatry

## 2022-09-28 DIAGNOSIS — F209 Schizophrenia, unspecified: Secondary | ICD-10-CM | POA: Insufficient documentation

## 2022-09-28 DIAGNOSIS — Z87891 Personal history of nicotine dependence: Secondary | ICD-10-CM | POA: Insufficient documentation

## 2022-09-28 DIAGNOSIS — R45851 Suicidal ideations: Secondary | ICD-10-CM | POA: Insufficient documentation

## 2022-09-28 DIAGNOSIS — Z5900 Homelessness unspecified: Secondary | ICD-10-CM | POA: Insufficient documentation

## 2022-09-28 DIAGNOSIS — F39 Unspecified mood [affective] disorder: Secondary | ICD-10-CM

## 2022-09-28 DIAGNOSIS — F32A Depression, unspecified: Secondary | ICD-10-CM | POA: Insufficient documentation

## 2022-09-28 NOTE — ED Triage Notes (Signed)
Pt presents to [facility] due to [place] with a plan to stab herself or jump from a building. Pt denies any triggers at the moment but states she has been experiencing SI for he past 2 days. Pt reports being diagnosed with Schizophrenia and Bipolar disorder but has been without medication for an unknown amount of time. Pt denies drug or alcohol use, HI and AVH at this time.

## 2022-09-28 NOTE — Discharge Instructions (Addendum)
   [place] (Reserve) [address] [place], [place], [place] [address] [phone] [facility] [address] [place], [place] [address] [phone] [facility]) (Day Shelter) [place] [address] [place], [place] [address] [phone] ex. [phone] Monday-Friday 8am-3pm, Saturday-Sunday 8am-2pm [facility] [address], [place], [place], [address] [phone] [facility] [address], [place], [place], [address] [phone] [facility] [place], [place], [place], [address] [phone] [facility] [place], [place], [place] [address] [phone]    Outpatient Resources: Medication/Therapy  If you are not given an appointment, please call and ask about your insurance prior to making an appointment in the event the agency is not contracted with your insurance or require a copay.  Mobile Crisis Number: [phone] 24/7 response  [facility]:   [address]  [phone]   (Walk in Crisis 24/7, Walk in Appointments Daily 8am-3:30pm)  For Specific Therapist:  Psychologytoday.[place], [place] [address] [phone]  [facility] [phone]. [place], [place] [address] [phone] (Required copay upfront to hold appointment)  [place]:  (will also take occasional Medicaid) [facility]:, [place]. [place], [place] [address] [phone]  [place]: [address], [place], [place] [address] [phone] [place]: [address] [place], [place], [place] [address] [phone] [facility]: [place], [place], [place] [address] [place], [place] [address] [place], [place] [address] [place], [place]  [address] [name], [place] [address] [place] ([place] and [place]) [place]:  [address] [place] [address]  [phone] [place]:  [facility] [address], [place], [place] [address]  [facility] [address] [place], [place] [address] [phone]  [facility] [address] [place], [place] [address] Phone: [phone]  [place]. [address] [place], [place] [address] [phone]  Launa Flight (Psychiatrist) [place], [place] [address] [phone]  [facility] [place], [place] [address] [phone] (walk in appointments for new patients 9am-4pm)  [place]   [place], [place] [address] [phone]  Kyra Searles and [facility] [address], [place], [place] [address] [place] [address] [place], [place] [address] [place] [address] [place], [place], [place] [address] [phone]  [facility] ([place]) [address]  [place],  [address] [employer] (also accepts [facility] and [facility]) [address] [place],  [address] [phone] (walk-in clinic hours: Mon/Tues 12pm-4pm, Thurs 8am-12pm)   [facility] (therapy only, no medication management onsite) (accepts [facility]) [address] [place],  [address] [phone]

## 2022-09-28 NOTE — ED Provider Notes (Addendum)
Behavioral Health Urgent Care Medical Screening Exam  Patient Name: Emily [name] MRN: [id number] Date of Evaluation: 09/28/22 Chief Complaint:   Diagnosis:  Final diagnoses:  Unspecified mood (affective) disorder ([place])    History of Present illness: Emily [name] is a 37 y.o. female unhoused, with documented past psychiatric history significant for schizophrenia, methamphetamine induced mood disorder, cannabis use disorder, nicotine use disorder, and amphetamine use disorder presenting voluntarily to [facility] for depression, withdrawal symptoms, and passive suicidal ideation.   Patient reports that for the past 2-3 weeks she has been experiencing hopelessness, worthlessness, guilt.  On exam she appears euthymic, in no acute distress.  Her thought processes linear and organized.  Denies any changes in sleep or appetite.  She is unable to identify any specific triggers related to this. She endorses suicidal ideation, which is passive, denies any specific plans.  Clarifies that she had a suicide attempt a year ago, in which she attempted to cut herself is a knife in the setting of losing her mother.  When asked about history of depression, denies ever having any previous symptoms.   On assessment, unable to gather meaningful psychiatric history from patient, as that she is a poor historian.  She denies any previous psychiatric hospitalizations, there is a documented psychiatric hospitalization at [facility] on 07/23/2022, seen by Dr. [name].  At that time she was admitted for psychosis, and acute decompensation in the setting of medication noncompliance.  On assessment, patient denies any psychiatric medications.  Denies any recent substance use, only endorses abusing prescription medications which include Toradol and hydrocodone.  Reports that the last time she had these medications were 4 weeks ago.  She denies any alcohol or smoking use stating "here and there".  She reports experiencing blurry vision, sweats,  and shivering related to withdrawal symptoms.  On assessment she does not appear diaphoretic or ill-appearing, no tremor noted on exam.  On assessment, she also initially endorses auditory and visual hallucinations, stating she hears voices and has been seeing things.  However upon clarification, patient denies these perceptual disturbances, reports only hearing her own thoughts.  On assessment there are no apparent paranoid ideations, there are no apparent delusional thought processes.    [facility] from 09/28/2022 in [facility] Admission (Discharged) from 07/23/2022 in [facility] ED from 07/21/2022 in [place] Moderate Risk No Risk No Risk       Psychiatric Specialty Exam  Presentation  General Appearance:Appropriate for Environment; Casual; Fairly Groomed  Eye Contact:Good  Speech:Clear and Coherent; Normal Rate  Speech Volume:Normal  Handedness:Right   Mood and Affect  Mood: Euthymic  Affect: Congruent; Appropriate   Thought Process  Thought Processes: Goal Directed; Linear  Descriptions of Associations:Intact  Orientation:Full (Time, Place and Person)  Thought Content:Logical; WDL  Diagnosis of Schizophrenia or Schizoaffective disorder in past: Yes  Duration of Psychotic Symptoms: Greater than six months  Hallucinations:None Reported to RN positive auditory hallucinations, noncommand in nature  Ideas of Reference:None  Suicidal Thoughts:No -- (n/a)  Homicidal Thoughts:No   Sensorium  Memory: Immediate Fair; Recent Fair  Judgment: Fair  Insight: Fair   Executive Functions  Concentration: Good  Attention Span: Good  Recall: Good  Fund of Knowledge: Fair  Language: Fair   Psychomotor Activity  Psychomotor Activity: Normal   Assets  Assets: Communication Skills; Desire for Improvement; Leisure Time; Physical Health;  Social Support   Sleep  Sleep: Good  Number of hours:  7  ROS Constitutional: Denies loss or gain of weight, no fatigue, no fevers, chills, night sweats HEENT: Denies changes in vision and hearing. Respiratory: Denies SOB and cough. CV: Denies palpitations and CP. GI: Denies abdominal pain, nausea, vomiting and diarrhea. GU: Denies dysuria and urinary frequency. MSK: Denies myalgia and joint pain. Skin: Denies rash and pruritus. Neuro: Denies weakness, no numbness, no dizziness, no balance problems, no headache, no seizures, no trouble swallowing (dysphagia), no trouble speaking (aphasia or dysarthria), no confusion, no b/b incontinence Psychiatric: Denies recent changes in mood. Denies anxiety and depression.   Physical Exam General: No acute distress. Awake and conversant. Eyes: Normal conjunctiva, anicteric. Round symmetric pupils. ENT: Hearing grossly intact. No nasal discharge. Neck: Neck is supple. No masses or thyromegaly. Respiratory: Respirations are non-labored. No wheezing. Skin: Warm. No rashes or ulcers. Psych: Alert and oriented. Cooperative, Appropriate mood and affect, Normal judgment. CV: No lower extremity edema. MSK: Normal ambulation. No clubbing or cyanosis. Neuro: Sensation and CN II-XII grossly normal.   Blood pressure 100/66, pulse 96, temperature 98.2 F (36.8 C), resp. rate 18. There is no height or weight on file to calculate BMI.  Musculoskeletal: Strength & Muscle Tone: within normal limits Gait & Station: normal Patient leans: N/A   [place] MSE Discharge Disposition for Follow up and Recommendations: Based on my evaluation the patient does not appear to have an emergency medical condition and can be discharged with resources and follow up care in outpatient services for Substance Abuse Intensive Outpatient Program   Christene Slates, MD 09/28/2022, 1:11 PM

## 2022-10-23 ENCOUNTER — Encounter (HOSPITAL_COMMUNITY): Payer: Self-pay | Admitting: *Deleted

## 2022-10-23 ENCOUNTER — Emergency Department (HOSPITAL_COMMUNITY)
Admission: EM | Admit: 2022-10-23 | Discharge: 2022-10-24 | Disposition: A | Discharge status: Home / Self Care | Payer: Medicaid Other | Attending: Emergency Medicine | Admitting: Emergency Medicine

## 2022-10-23 ENCOUNTER — Other Ambulatory Visit: Payer: Self-pay

## 2022-10-23 DIAGNOSIS — Z1152 Encounter for screening for COVID-19: Secondary | ICD-10-CM | POA: Diagnosis not present

## 2022-10-23 DIAGNOSIS — R443 Hallucinations, unspecified: Secondary | ICD-10-CM | POA: Insufficient documentation

## 2022-10-23 LAB — CBC
HCT: 37.8 % (ref 36.0–46.0)
Hemoglobin: 12.9 g/dL (ref 12.0–15.0)
MCH: 30.7 pg (ref 26.0–34.0)
MCHC: 34.1 g/dL (ref 30.0–36.0)
MCV: 90 fL (ref 80.0–100.0)
Platelets: 405 10*3/uL — ABNORMAL HIGH (ref 150–400)
RBC: 4.2 MIL/uL (ref 3.87–5.11)
RDW: 12.8 % (ref 11.5–15.5)
WBC: 7 10*3/uL (ref 4.0–10.5)
nRBC: 0 % (ref 0.0–0.2)

## 2022-10-23 LAB — COMPREHENSIVE METABOLIC PANEL
ALT: 32 U/L (ref 0–44)
AST: 33 U/L (ref 15–41)
Albumin: 3.8 g/dL (ref 3.5–5.0)
Alkaline Phosphatase: 32 U/L — ABNORMAL LOW (ref 38–126)
Anion gap: 9 (ref 5–15)
BUN: 5 mg/dL — ABNORMAL LOW (ref 6–20)
CO2: 23 mmol/L (ref 22–32)
Calcium: 8.5 mg/dL — ABNORMAL LOW (ref 8.9–10.3)
Chloride: 104 mmol/L (ref 98–111)
Creatinine, Ser: 0.48 mg/dL (ref 0.44–1.00)
GFR, Estimated: >60 mL/min (ref >60)
Glucose, Bld: 106 mg/dL — ABNORMAL HIGH (ref 70–99)
Potassium: 3.6 mmol/L (ref 3.5–5.1)
Sodium: 136 mmol/L (ref 135–145)
Total Bilirubin: 0.3 mg/dL (ref 0.3–1.2)
Total Protein: 6.9 g/dL (ref 6.5–8.1)

## 2022-10-23 LAB — ETHANOL: Alcohol, Ethyl (B): <10 mg/dL (ref <10)

## 2022-10-23 LAB — RESP PANEL BY RT-PCR (RSV, FLU A&B, COVID)  RVPGX2
Influenza A by PCR: NEGATIVE
Influenza B by PCR: NEGATIVE
Resp Syncytial Virus by PCR: NEGATIVE
SARS Coronavirus 2 by RT PCR: NEGATIVE

## 2022-10-23 MED ORDER — SODIUM CHLORIDE 0.9 % IV BOLUS
1000.0000 mL | Freq: Once | INTRAVENOUS | Status: AC
  Administered 2022-10-23: 1000 mL via INTRAVENOUS

## 2022-10-23 MED ORDER — HALOPERIDOL 5 MG PO TABS
10.0000 mg | ORAL_TABLET | Freq: Every day | ORAL | Status: DC
  Administered 2022-10-24: 10 mg via ORAL
  Filled 2022-10-23: qty 2

## 2022-10-23 MED ORDER — ONDANSETRON HCL 4 MG/2ML IJ SOLN
4.0000 mg | Freq: Once | INTRAMUSCULAR | Status: AC
  Administered 2022-10-23: 4 mg via INTRAVENOUS
  Filled 2022-10-23: qty 2

## 2022-10-23 MED ORDER — HALOPERIDOL 5 MG PO TABS: 5.0000 mg | ORAL_TABLET | Freq: Every morning | ORAL | Status: DC

## 2022-10-23 MED ORDER — BENZTROPINE MESYLATE 1 MG PO TABS
0.5000 mg | ORAL_TABLET | Freq: Two times a day (BID) | ORAL | Status: DC
  Administered 2022-10-24: 0.5 mg via ORAL
  Filled 2022-10-23: qty 1

## 2022-10-23 NOTE — ED Provider Notes (Signed)
[facility] Provider Note   CSN: [id number] Arrival date & time: 10/23/22  1657     History  Chief Complaint  Patient presents with   Altered Mental Status    Emily [name] is a 37 y.o. female.   Altered Mental Status Reportedly brought in for nausea vomiting diarrhea.  States to me that she has had hallucinations.  Seeing and hearing things that she knows are not there.  History of schizophrenia history of methamphetamine abuse.  States she has not used methamphetamine in over a month however.  Somewhat difficult to get history from her.    Past Medical History:  Diagnosis Date   Anxiety    Anxiety    Depression    Schizophrenia ([place])     Home Medications Prior to Admission medications   Medication Sig Start Date End Date Taking? Authorizing Provider  benztropine (COGENTIN) 0.5 MG tablet Take 1 tablet (0.5 mg total) by mouth 2 (two) times daily. 07/31/22 08/30/22  [name], MD  haloperidol (HALDOL) 5 MG tablet Take one tablet in the morning and 2 in the evening 07/31/22   [name], MD      Allergies    Bee venom    Review of Systems   Review of Systems  Physical Exam Updated Vital Signs BP 111/78   Pulse 92   Temp 100 F (37.8 C) (Oral)   Resp 18   SpO2 98%  Physical Exam Vitals and nursing note reviewed.  HENT:     Mouth/Throat:     Comments: edentulous Cardiovascular:     Rate and Rhythm: Regular rhythm.  Pulmonary:     Effort: No respiratory distress.  Abdominal:     Tenderness: There is no abdominal tenderness.  Musculoskeletal:        General: No tenderness.  Neurological:     Mental Status: She is alert.     Comments: Awake and pleasant but somewhat difficult to get history from     ED Results / Procedures / Treatments   Labs (all labs ordered are listed, but only abnormal results are displayed) Labs Reviewed  COMPREHENSIVE METABOLIC PANEL - Abnormal; Notable for the following components:       Result Value   Glucose, Bld 106 (*)    BUN 5 (*)    Calcium 8.5 (*)    Alkaline Phosphatase 32 (*)    All other components within normal limits  CBC - Abnormal; Notable for the following components:   Platelets 405 (*)    All other components within normal limits  RESP PANEL BY RT-PCR (RSV, FLU A&B, COVID)  RVPGX2  ETHANOL  RAPID URINE DRUG SCREEN, HOSP PERFORMED  URINALYSIS, ROUTINE W REFLEX MICROSCOPIC  PREGNANCY, URINE    EKG None  Radiology No results found.  Procedures Procedures    Medications Ordered in ED Medications  sodium chloride 0.9 % bolus 1,000 mL (0 mLs Intravenous Stopped 10/23/22 1910)  ondansetron (ZOFRAN) injection 4 mg (4 mg Intravenous Given 10/23/22 1750)    ED Course/ Medical Decision Making/ A&P                             Medical Decision Making Amount and/or Complexity of Data Reviewed Labs: ordered.  Risk Prescription drug management.  Patient reportedly brought in for nausea vomiting diarrhea.  History of subs abuse.  Reportedly also hallucinating.  Will get basic blood work and give  fluids and antiemetics.  Will get some medical clearance labs and reevaluate  Lab work reassuring.  Although urinalysis still pending.  Patient is medically cleared.  Will have patient seen by TTS.  Care turned over to Dr. Sedonia Small.        Final Clinical Impression(s) / ED Diagnoses Final diagnoses:  Hallucinations    Rx / DC Orders ED Discharge Orders     None         Davonna Belling, MD 10/23/22 [phone]

## 2022-10-23 NOTE — ED Notes (Signed)
Pt informed that her sister was calling and wanted an updated, pt states she doesn't want to talk to her

## 2022-10-23 NOTE — ED Notes (Signed)
Pt provided soda and crackers. Informed again that she needs to give a urine sample and that that is the last thing the doctors is waiting for prior to makes choice on admission or discharge.   Pt also asking for anxiety medications, provider aware

## 2022-10-23 NOTE — ED Triage Notes (Signed)
Pt brought in by [facility] for c/o n/v/d for the last couple of days; pt was recently discharged from a rehab unit for use of meth  Pt refusing to answer any questions during triage

## 2022-10-23 NOTE — ED Notes (Signed)
Pt ambulatory to the restroom, given urine sample cup and informed that a urine sample was ordered.   Pt returned from the bathroom with out urine sample

## 2022-10-24 NOTE — BH Assessment (Addendum)
Comprehensive Clinical Assessment (CCA) Note  10/24/2022 Emily [name] [id number] Disposition: Clinician discussed patient care with Erasmo Score, NP.  She recommended psych clearance of patient.  Clinician informed Dr. Sedonia Small and RN Lacinda Axon of disposition via secure messaging.  Patient is difficult to [facility], she has a paucity of teeth and her speech is garbled.  Patient has to be asked to repeat herself several times.  Patient does not appear to be responding to internal stimuli.  She does not appear to be delusional.  Patient sleep is poor but she also uses methamphetamine.  Gives conflicting lengths of time of when she last used meth.  Patient is wanting to go back home but is unclear about whether she can return to [facility] home.    Patient denies having any outpatient care.     Chief Complaint:  Chief Complaint  Patient presents with   Altered Mental Status   Visit Diagnosis: Schizophrenia; Amphetamine use d/o    CCA Screening, Triage and Referral (STR)  Patient Reported Information How did you hear about [place]? Legal System  What Is the Reason for Your Visit/Call Today? Pt said she needed help so she [facility] to get [facility].  Pt has been staying with PGM for the last week.  Pt has been hearing things but says "not that much."  Pt denies any current SI and no past attempts.  Patient denies any HI.  Patient has hx of using meth but has not used any in two weeks.  Patient reports daily use of marijuana.  Usually uses a joint a day but cannot recall last use.  She says "I just need some help." Pt indicates she cannot return to her PGM's home.  Patient has hx of cutting herself with a knife on her thighs.  Last time was over a month ago.  Patient denies access to weapons.  Pt denies any current outpatient care.  How Long Has This Been Causing You Problems? <Week  What Do You Feel Would Help You the Most Today? Treatment for Depression or other mood problem; Alcohol or Drug Use  Treatment   Have You Recently Had Any Thoughts About Hurting Yourself? No  Are You Planning to Commit Suicide/Harm Yourself At This time? No   Flowsheet Row ED from 10/23/2022 in [facility] Department at [facility] ED from 09/28/2022 in [facility] Admission (Discharged) from 07/23/2022 in [facility]  C-SSRS RISK CATEGORY No Risk Moderate Risk No Risk       Have you Recently Had Thoughts About [place]? No  Are You Planning to Harm Someone at This Time? No  Explanation: Pt is denying any SI, or HI.   Have You Used Any Alcohol or Drugs in the Past 24 Hours? No  What Did You Use and How Much? Last meth use was 2 weeks ago.   Do You Currently Have a Therapist/Psychiatrist? No  Name of Therapist/Psychiatrist: Name of Therapist/Psychiatrist: Does not have one.   Have You Been Recently Discharged From Any Office Practice or Programs? No  Explanation of Discharge From Practice/Program: Denies     CCA Screening Triage Referral Assessment Type of Contact: Tele-Assessment  Telemedicine Service Delivery:   Is this Initial or Reassessment? Is this Initial or Reassessment?: Initial Assessment  Date Telepsych consult ordered in [facility]:  Date Telepsych consult ordered in [facility]: 10/23/22  Time Telepsych consult ordered in [facility]:  Time Telepsych consult ordered in [facility]:  2305  Location of Assessment: [facility] ED  Provider Location: [facility] Assessment Services   Collateral Involvement: Pt declines to have any collaterals called.   Does Patient Have a Automotive engineer Guardian? No  Legal Guardian Contact Information: No guardian  Copy of Legal Guardianship Form: -- (Denies having a guardian.)  Legal Guardian Notified of Arrival: -- (Denies having a guardian.)  Legal Guardian Notified of Pending Discharge: -- (Denies having a guardian.)  If Minor and Not Living with Parent(s), Who has  Custody? Pt is a adult  Is CPS involved or ever been involved? Never  Is APS involved or ever been involved? Never   Patient Determined To Be At Risk for Harm To Self or Others Based on Review of Patient Reported Information or Presenting Complaint? No  Method: -- (Pt denies any SI or HI.  Hx of self harm but it has been weeks since last episode.)  Availability of Means: -- (Pt denies any SI or HI. Hx of self harm but it has been weeks since last episode.)  Intent: -- (Pt denies any SI or HI. Hx of self harm but it has been weeks since last episode.)  Notification Required: -- (Pt denies any SI or HI. Hx of self harm but it has been weeks since last episode.)  Additional Information for Danger to Others Potential: -- (None)  Additional Comments for Danger to Others Potential: Pt denies any SI or HI.  No recent self harm.  Are There Guns or Other Weapons in Your Home? No  Types of Guns/Weapons: No weapons or guns  Are These Weapons Safely Secured?                            No  Who Could Verify You Are Able To Have These Secured: No weapons or guns  Do You Have any Outstanding Charges, Pending Court Dates, Parole/Probation? Pt denies  Contacted To Inform of Risk of Harm To Self or Others: -- (Thoughts of harming hersefl or others.)    Does Patient Present under Involuntary Commitment? No    [place] of Residence: [place]   Patient Currently Receiving the Following Services: Not Receiving Services   Determination of Need: Urgent (48 hours)   Options For Referral: Other: Comment; Outpatient Therapy (Pt does not meet inpatient criteria per Rockney Ghee, NP.)     CCA Biopsychosocial Patient Reported Schizophrenia/Schizoaffective Diagnosis in Past: Yes   Strengths: Pt denies having any strengths.   Mental Health Symptoms Depression:   Increase/decrease in appetite; Worthlessness; Hopelessness; Difficulty Concentrating   Duration of Depressive symptoms:   Duration of Depressive Symptoms: Greater than two weeks   Mania:   None   Anxiety:    Worrying; Tension; Fatigue   Psychosis:   Hallucinations   Duration of Psychotic symptoms:  Duration of Psychotic Symptoms: Greater than six months   Trauma:   None   Obsessions:   None   Compulsions:   None   Inattention:   Forgetful; Does not seem to listen   Hyperactivity/Impulsivity:   None   Oppositional/Defiant Behaviors:   None   Emotional Irregularity:   None   Other Mood/Personality Symptoms:   Unknown    Mental Status Exam Appearance and self-care  Stature:   Average   Weight:   Thin   Clothing:   Disheveled   Grooming:   Neglected   Cosmetic use:   None   Posture/gait:   Normal  Motor activity:   Restless   Sensorium  Attention:   Distractible   Concentration:   Variable; Scattered   Orientation:   Situation; Place; Person   Recall/memory:   Defective in Immediate; Defective in Recent; Defective in Short-term   Affect and Mood  Affect:   Anxious; Flat   Mood:   Anxious   Relating  Eye contact:   Fleeting   Facial expression:   Anxious; Tense   Attitude toward examiner:   Guarded   Thought and Language  Speech flow:  Paucity; Slow; Slurred; Garbled (Difficult to undestand)   Thought content:   Appropriate to Mood and Circumstances   Preoccupation:   None   Hallucinations:   None (Pt is denying but has a hx.)   Organization:   Loose; Disorganized   Transport planner of Knowledge:   Fair   Intelligence:   Needs investigation   Abstraction:   Popular   Judgement:   Poor   Reality Testing:   Variable   Insight:   Gaps; Poor   Decision Making:   Only simple   Social Functioning  Social Maturity:   Isolates   Social Judgement:   -- Special educational needs teacher)   Stress  Stressors:   Family conflict   Coping Ability:   Programme researcher, broadcasting/film/video Deficits:   Training and development officer; Self-control    Supports:   Support needed     Religion: Religion/Spirituality Are You A Religious Person?: No How Might This Affect Treatment?: Not in the slightest  Leisure/Recreation: Leisure / Recreation Do You Have Hobbies?: No Leisure and Hobbies: Pt did not identify any hobbies  Exercise/Diet: Exercise/Diet Do You Exercise?: No Have You Gained or Lost A Significant Amount of Weight in the Past Six Months?: No Do You Follow a Special Diet?: No Do You Have Any Trouble Sleeping?: Yes Explanation of Sleeping Difficulties: Did not say clearly.   CCA Employment/Education Employment/Work Situation: Employment / Work Situation Employment Situation: Unemployed Patient's Job has Been Impacted by Current Illness: No Has Patient ever Been in Passenger transport manager?: No  Education: Education Is Patient Currently Attending School?: No Last Grade Completed: 31 Did [place]?: No Did You Have An Individualized Education Program (IIEP): No Did You Have Any Difficulty At [employer]?: No Patient's Education Has Been Impacted by Current Illness: No   CCA Family/Childhood History Family and Relationship History: Family history Marital status: Single Does patient have children?: Yes How many children?: 2 How is patient's relationship with their children?: 82 and 2 y/o (in custody of paternal grandparents); describes relationship as "good" but does not see them often  Childhood History:  Childhood History By whom was/is the patient raised?: Mother, Both parents Did patient suffer any verbal/emotional/physical/sexual abuse as a child?: No Did patient suffer from severe childhood neglect?: No Has patient ever been sexually abused/assaulted/raped as an adolescent or adult?: No Was the patient ever a victim of a crime or a disaster?: No Witnessed domestic violence?: No Has patient been affected by domestic violence as an adult?: No       CCA Substance Use Alcohol/Drug Use: Alcohol / Drug  Use Pain Medications: None Prescriptions: None Over the Counter: No one History of alcohol / drug use?: Yes Longest period of sobriety (when/how long): Unknown Negative Consequences of Use: Personal relationships Withdrawal Symptoms: None Substance #1 Name of Substance 1: Methamphetamine 1 - Age of First Use: "I don't know" 1 - Amount (size/oz): Smokes but doesn't know how much 1 - Frequency:  Daily 1 - Duration: ongoing 1 - Last Use / Amount: Two weeks ago 1 - Method of Aquiring: illegal means` 1- Route of Use: Smoking it. Substance #2 Name of Substance 2: Marijuana 2 - Age of First Use: Pt cannot recall when 2 - Amount (size/oz): A joint 2 - Frequency: 2-3 times in a week 2 - Duration: oingoing 2 - Last Use / Amount: Cannot recall 2 - Method of Aquiring: pt does not report 2 - Route of Substance Use: Inhalation.                     ASAM's:  Six Dimensions of Multidimensional Assessment  Dimension 1:  Acute Intoxication and/or Withdrawal Potential:      Dimension 2:  Biomedical Conditions and Complications:      Dimension 3:  Emotional, Behavioral, or Cognitive Conditions and Complications:     Dimension 4:  Readiness to Change:     Dimension 5:  Relapse, Continued use, or Continued Problem Potential:     Dimension 6:  Recovery/Living Environment:     ASAM Severity Score:    ASAM Recommended Level of Treatment:     Substance use Disorder (SUD)    Recommendations for Services/Supports/Treatments:    Discharge Disposition:    DSM5 Diagnoses: Patient Active Problem List   Diagnosis Date Noted   History of amphetamine dependence/abuse ([place]) 07/25/2022   Chronic paranoid schizophrenia ([place]) 07/23/2022   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[place]) 02/11/2022   Altered mental state 04/08/2021   Substance abuse ([place]) 04/06/2021   Depression 04/02/2021   Hepatitis C antibody positive in blood 06/29/2016   Tobacco use disorder 06/28/2016   Amphetamine use  disorder, severe ([place]) 06/28/2016   Cannabis use disorder, severe, dependence ([place]) 06/28/2016     Referrals to Alternative Service(s): Referred to Alternative Service(s):   Place:   Date:   Time:    Referred to Alternative Service(s):   Place:   Date:   Time:    Referred to Alternative Service(s):   Place:   Date:   Time:    Referred to Alternative Service(s):   Place:   Date:   Time:     Waldron Session

## 2022-10-24 NOTE — ED Provider Notes (Signed)
  Provider Note MRN:  [id number]  Arrival date & time: 10/24/22    ED Course and Medical Decision Making  Assumed care from Dr. Alvino Chapel at shift change.  Patient resting comfortably this evening.  Wakes easily, no complaints.  Specifically denies any dysuria.  Patient has been cleared from a mental health perspective, appropriate for outpatient management.  She is medically cleared as well, appropriate for discharge.  Procedures  Final Clinical Impressions(s) / ED Diagnoses     ICD-10-CM   1. Hallucinations  R44.3       ED Discharge Orders     None         Discharge Instructions      You were evaluated in the Emergency Department and after careful evaluation, we did not find any emergent condition requiring admission or further testing in the hospital.  Your exam/testing today is overall reassuring.  If you have continued concerns about your mental health you can go to the behavioral health urgent care [address] in [place].  This location is open 24 hours a day.  Please return to the Emergency Department if you experience any worsening of your condition.   Thank you for allowing [place] to be a part of your care.    Barth Kirks. Sedonia Small, [place] [email or web address]    Maudie Flakes, MD 10/24/22 [id number]

## 2022-10-24 NOTE — Discharge Instructions (Addendum)
You were evaluated in the Emergency Department and after careful evaluation, we did not find any emergent condition requiring admission or further testing in the hospital.  Your exam/testing today is overall reassuring.  If you have continued concerns about your mental health you can go to the behavioral health urgent care [address] in [place].  This location is open 24 hours a day.  Please return to the Emergency Department if you experience any worsening of your condition.   Thank you for allowing [place] to be a part of your care.

## 2022-10-24 NOTE — ED Notes (Signed)
Pt ambulatory to private room for TSS consult

## 2022-10-24 NOTE — ED Notes (Signed)
Patient states her sister is coming to give her a ride home.

## 2023-02-12 ENCOUNTER — Encounter: Payer: Self-pay | Admitting: Neurology

## 2023-02-13 ENCOUNTER — Encounter: Payer: Self-pay | Admitting: Neurology

## 2023-02-13 ENCOUNTER — Ambulatory Visit: Payer: Medicaid Other | Admitting: Neurology

## 2023-02-17 ENCOUNTER — Encounter (HOSPITAL_COMMUNITY): Payer: Self-pay | Admitting: Pharmacy Technician

## 2023-02-17 ENCOUNTER — Emergency Department (HOSPITAL_COMMUNITY)
Admission: EM | Admit: 2023-02-17 | Discharge: 2023-02-18 | Disposition: A | Discharge status: Home / Self Care | Payer: Medicaid Other | Attending: Emergency Medicine | Admitting: Emergency Medicine

## 2023-02-17 DIAGNOSIS — F1910 Other psychoactive substance abuse, uncomplicated: Secondary | ICD-10-CM | POA: Diagnosis not present

## 2023-02-17 DIAGNOSIS — F32A Depression, unspecified: Secondary | ICD-10-CM

## 2023-02-17 DIAGNOSIS — R45851 Suicidal ideations: Secondary | ICD-10-CM

## 2023-02-17 DIAGNOSIS — R4182 Altered mental status, unspecified: Secondary | ICD-10-CM | POA: Insufficient documentation

## 2023-02-17 LAB — CBC WITH DIFFERENTIAL/PLATELET
Abs Immature Granulocytes: 0.01 10*3/uL (ref 0.00–0.07)
Basophils Absolute: 0 10*3/uL (ref 0.0–0.1)
Basophils Relative: 0 %
Eosinophils Absolute: 0.2 10*3/uL (ref 0.0–0.5)
Eosinophils Relative: 3 %
HCT: 34.3 % — ABNORMAL LOW (ref 36.0–46.0)
Hemoglobin: 11.3 g/dL — ABNORMAL LOW (ref 12.0–15.0)
Immature Granulocytes: 0 %
Lymphocytes Relative: 39 %
Lymphs Abs: 2.4 10*3/uL (ref 0.7–4.0)
MCH: 30.8 pg (ref 26.0–34.0)
MCHC: 32.9 g/dL (ref 30.0–36.0)
MCV: 93.5 fL (ref 80.0–100.0)
Monocytes Absolute: 0.6 10*3/uL (ref 0.1–1.0)
Monocytes Relative: 10 %
Neutro Abs: 3 10*3/uL (ref 1.7–7.7)
Neutrophils Relative %: 48 %
Platelets: 297 10*3/uL (ref 150–400)
RBC: 3.67 MIL/uL — ABNORMAL LOW (ref 3.87–5.11)
RDW: 13.4 % (ref 11.5–15.5)
WBC: 6.3 10*3/uL (ref 4.0–10.5)
nRBC: 0 % (ref 0.0–0.2)

## 2023-02-17 LAB — COMPREHENSIVE METABOLIC PANEL
ALT: 33 U/L (ref 0–44)
AST: 50 U/L — ABNORMAL HIGH (ref 15–41)
Albumin: 3.5 g/dL (ref 3.5–5.0)
Alkaline Phosphatase: 33 U/L — ABNORMAL LOW (ref 38–126)
Anion gap: 8 (ref 5–15)
BUN: 14 mg/dL (ref 6–20)
CO2: 24 mmol/L (ref 22–32)
Calcium: 8.7 mg/dL — ABNORMAL LOW (ref 8.9–10.3)
Chloride: 107 mmol/L (ref 98–111)
Creatinine, Ser: 0.73 mg/dL (ref 0.44–1.00)
GFR, Estimated: >60 mL/min (ref >60)
Glucose, Bld: 96 mg/dL (ref 70–99)
Potassium: 3.6 mmol/L (ref 3.5–5.1)
Sodium: 139 mmol/L (ref 135–145)
Total Bilirubin: 0.2 mg/dL — ABNORMAL LOW (ref 0.3–1.2)
Total Protein: 6 g/dL — ABNORMAL LOW (ref 6.5–8.1)

## 2023-02-17 LAB — RAPID URINE DRUG SCREEN, HOSP PERFORMED
Amphetamines: NOT DETECTED
Barbiturates: NOT DETECTED
Benzodiazepines: NOT DETECTED
Cocaine: NOT DETECTED
Opiates: NOT DETECTED
Tetrahydrocannabinol: NOT DETECTED

## 2023-02-17 LAB — I-STAT BETA HCG BLOOD, ED (MC, WL, AP ONLY): I-stat hCG, quantitative: <5 m[IU]/mL (ref <5)

## 2023-02-17 LAB — ETHANOL: Alcohol, Ethyl (B): <10 mg/dL (ref <10)

## 2023-02-17 NOTE — ED Notes (Signed)
Pt changing into purple scrubs att 

## 2023-02-17 NOTE — ED Notes (Signed)
TTS in process 

## 2023-02-17 NOTE — ED Notes (Signed)
Pt beloingings placed in locker number 2

## 2023-02-17 NOTE — ED Provider Notes (Signed)
[place] EMERGENCY DEPARTMENT AT [facility] Provider Note   CSN: [id number] Arrival date & time: 02/17/23  1348     History  Chief Complaint  Patient presents with   Suicidal    Emily [name] is a 37 y.o. female.  Patient is a 37 year old female with a past medical history of schizophrenia and substance use senting to the emergency department with altered mental status and suicidal ideation.  Per EMS, the patient was found wandering around in a parking lot confused and they brought her into the ED.  On her arrival here, the patient was complaining of suicidal ideation.  She denies any plan.  She denies any HI or hallucinations.  She denies any drug or alcohol use today.  Per EMS, the patient was recently seen at [facility] ED but I am unable to see the records for her workup performed there.  The history is provided by the patient and the EMS personnel.       Home Medications Prior to Admission medications   Medication Sig Start Date End Date Taking? Authorizing Provider  benztropine (COGENTIN) 0.5 MG tablet Take 1 tablet (0.5 mg total) by mouth 2 (two) times daily. 07/31/22 08/30/22  Park Pope, MD  haloperidol (HALDOL) 5 MG tablet Take one tablet in the morning and 2 in the evening 07/31/22   Park Pope, MD      Allergies    Bee venom    Review of Systems   Review of Systems  Physical Exam Updated Vital Signs BP 101/75 (BP Location: Right Arm)   Pulse 98   Temp (!) 97.5 F (36.4 C)   Resp 20   SpO2 100%  Physical Exam Vitals and nursing note reviewed.  Constitutional:      Appearance: She is not ill-appearing or toxic-appearing.     Comments: Asleep in bed resting comfortably, no acute distress, easily arousable to verbal stimulation  HENT:     Head: Normocephalic and atraumatic.     Nose: Nose normal.     Mouth/Throat:     Mouth: Mucous membranes are moist.     Pharynx: Oropharynx is clear.  Eyes:     Extraocular Movements: Extraocular movements  intact.     Conjunctiva/sclera: Conjunctivae normal.  Cardiovascular:     Rate and Rhythm: Normal rate and regular rhythm.     Heart sounds: Normal heart sounds.  Pulmonary:     Effort: Pulmonary effort is normal.     Breath sounds: Normal breath sounds.  Abdominal:     General: Abdomen is flat.     Palpations: Abdomen is soft.     Tenderness: There is no abdominal tenderness.  Musculoskeletal:        General: Normal range of motion.     Cervical back: Normal range of motion.  Skin:    General: Skin is warm and dry.     Comments: Scratches to neck without surrounding erythema, warmth or drainage  Neurological:     General: No focal deficit present.     Mental Status: She is oriented to person, place, and time.  Psychiatric:        Behavior: Behavior normal.     ED Results / Procedures / Treatments   Labs (all labs ordered are listed, but only abnormal results are displayed) Labs Reviewed  COMPREHENSIVE METABOLIC PANEL - Abnormal; Notable for the following components:      Result Value   Calcium 8.7 (*)    Total Protein 6.0 (*)  AST 50 (*)    Alkaline Phosphatase 33 (*)    Total Bilirubin 0.2 (*)    All other components within normal limits  CBC WITH DIFFERENTIAL/PLATELET - Abnormal; Notable for the following components:   RBC 3.67 (*)    Hemoglobin 11.3 (*)    HCT 34.3 (*)    All other components within normal limits  ETHANOL  RAPID URINE DRUG SCREEN, HOSP PERFORMED  I-STAT BETA HCG BLOOD, ED ([facility], [facility], [facility] ONLY)    EKG None  Radiology No results found.  Procedures Procedures    Medications Ordered in ED Medications - No data to display  ED Course/ Medical Decision Making/ A&P                             Medical Decision Making This patient presents to the ED with chief complaint(s) of SI with pertinent past medical history of schizophrenia, substance use which further complicates the presenting complaint. The complaint involves an extensive  differential diagnosis and also carries with it a high risk of complications and morbidity.    The differential diagnosis includes suicidal ideation, no evidence of HI or hallucinations, report of altered mental status via EMS however she is oriented here at her neurologic baseline, possible intoxication earlier today, electrolyte abnormality, hypo or hyperglycemia, no evidence of head trauma  Additional history obtained: Additional history obtained from EMS  Records reviewed prior ED records  ED Course and Reassessment: On patient's arrival to the emergency department she is awake and alert and oriented at her neurologic baseline in no acute distress.  She did complain of SI.  The patient was seen by triage and had labs performed for medical clearance showed no acute abnormalities.  Patient would like to be voluntary psych evaluation.  She will be placed on one-to-one observation for suicidal precautions.  She was medically cleared for psych recommendations for disposition.  Independent labs interpretation:  The following labs were independently interpreted: Within normal range  Independent visualization of imaging: -N/A  Consultation: - Consulted or discussed management/test interpretation w/ external professional: TTS  Social Determinants of health: substance use            Final Clinical Impression(s) / ED Diagnoses Final diagnoses:  None    Rx / DC Orders ED Discharge Orders     None         Rexford Maus, DO 02/17/23 1518

## 2023-02-17 NOTE — ED Notes (Signed)
Pt has been wanded by security. 

## 2023-02-17 NOTE — BH Assessment (Signed)
Clinician reached out to RN [facility] to complete TTS assessment. Per RN, patient currently in hallway. RN will follow-up once patient is roomed and able to be assessed.   Manfred Arch, MSW, LCSW Triage Specialist [phone]

## 2023-02-17 NOTE — ED Triage Notes (Addendum)
Pt bib ems with altered mental status. Seen at [facility] today and discharged both time. EMS found patient walking in the middle of the street. VSS with ems. EMS reports Pt completely disoriented. On assessment pt alert and oriented, stating here for SI.

## 2023-02-18 DIAGNOSIS — R4182 Altered mental status, unspecified: Secondary | ICD-10-CM

## 2023-02-18 MED ORDER — IBUPROFEN 400 MG PO TABS
400.0000 mg | ORAL_TABLET | Freq: Four times a day (QID) | ORAL | Status: DC | PRN
  Administered 2023-02-18: 400 mg via ORAL
  Filled 2023-02-18: qty 1

## 2023-02-18 MED ORDER — ACETAMINOPHEN 325 MG PO TABS: 650.0000 mg | ORAL_TABLET | Freq: Four times a day (QID) | ORAL | Status: DC | PRN

## 2023-02-18 NOTE — BH Assessment (Signed)
Comprehensive Clinical Assessment (CCA) Note  02/18/2023 Emily [name] [id number]  Disposition: Clinical report given to Emily Asper, NP recommends overnight observation for safety, to be reassessed in the morning. RN Emily [name] and Emily [name] notified of the recommendation.  The patient demonstrates the following risk factors for suicide: Chronic risk factors for suicide include: psychiatric disorder of schizophrenia . Acute risk factors for suicide include: social withdrawal/isolation. Protective factors for this patient include: hope for the future. Considering these factors, the overall suicide risk at this point appears to be low. Patient is appropriate for outpatient follow up.  Emily [name] presents to [facility] ED voluntarily, via EMS after being found walking around in a parking lot. Per chart review, patient has a history of schizophrenia. Patient reports she has been depressed and suicidal for a day. Patient is unable to recall why she was walking around in a parking lot. She says, "I was wandering off I guess". Patient endorses auditory hallucinations of people talking. The voices are not commanding. Patient denies any history of self-harm. Patient denies visual hallucinations or HI. Patient denies access to guns or weapons. Patient denies recent substance use.   Patient is unable to identify stressors. Patient initially reports she lives with her friend Emily [name]," however, she later states she has been homeless for the past week.  Patient is unemployed and says she does not receive disability. Patient denies having supports. Per chart, patient has a sister and daughter. Patient declines to have family contacted for collateral. Patient denies any history of abuse and denies legal problems.   Patient is currently not receiving mental health services. Patient reports she is supposed to be taking medication, however she stopped takin medication six months ago due to feeling like she did not  need it. Per chart review, patient hospitalized at [facility] 07/23/2022-07/31/2022 and 04/02/2021-04/11/2021. Per EDP note, patient has also been seen at [facility] several times.   Patient is dressed in scrubs and alert with garbled speech. Patient has good eye contact and becomes tearful during the assessment. Patient rocks at times and becomes irritated stating "I don't know why I'm here". Patient randomly hums and when asked why she states it just happens sometimes. When asked if collateral can be obtained from family, patient becomes increasingly agitated and states she needs mental health treatment somewhere, while declining collateral contacts. There is no indication patient is responding to internal stimuli. Patient symptoms calm at the end of the assessment.    Chief Complaint:  Chief Complaint  Patient presents with   Suicidal   Visit Diagnosis: Schizophrenia    CCA Screening, Triage and Referral (STR)  Patient Reported Information How did you hear about [place]? Other (Comment) (EMS)  What Is the Reason for Your Visit/Call Today? Emily [name] presents to [facility] ED voluntarily, via EMS after being found walking around in a parking lot. Patient reports she is depressed and suicidal with no plan. Patient states "I just don't feel like being here". Patient states she is currently homeless. She denies current substance use. Patient reports non-commanding auditory hallucinations of people talking. Patient denies HI or visual hallucinations.  How Long Has This Been Causing You Problems? <Week  What Do You Feel Would Help You the Most Today? Treatment for Depression or other mood problem   Have You Recently Had Any Thoughts About Hurting Yourself? Yes  Are You Planning to Commit Suicide/Harm Yourself At This time? No   Flowsheet Row ED from 02/17/2023 in [facility]  Emergency Department at [facility] ED from 10/23/2022 in [facility] Emergency Department at [facility] ED  from 09/28/2022 in [facility]  C-SSRS RISK CATEGORY No Risk No Risk Moderate Risk       Have you Recently Had Thoughts About Hurting Someone Emily [name]? No  Are You Planning to Harm Someone at This Time? No  Explanation: N/A   Have You Used Any Alcohol or Drugs in the Past 24 Hours? No  What Did You Use and How Much? N/A   Do You Currently Have a Therapist/Psychiatrist? No  Name of Therapist/Psychiatrist: Name of Therapist/Psychiatrist: N/A   Have You Been Recently Discharged From Any Office Practice or Programs? Yes  Explanation of Discharge From Practice/Program: Discharged from [facility] today.     CCA Screening Triage Referral Assessment Type of Contact: Tele-Assessment  Telemedicine Service Delivery: Telemedicine service delivery: This service was provided via telemedicine using a 2-way, interactive audio and video technology  Is this Initial or Reassessment? Is this Initial or Reassessment?: Initial Assessment  Date Telepsych consult ordered in [facility]:  Date Telepsych consult ordered in [facility]: 02/17/23  Time Telepsych consult ordered in [facility]:  Time Telepsych consult ordered in [facility]: 1519  Location of Assessment: [facility] ED  Provider Location: [facility] Assessment Services   Collateral Involvement: Patient declined.   Does Patient Have a Automotive engineer Guardian? No  Legal Guardian Contact Information: N/A  Copy of Legal Guardianship Form: -- (N/A)  Legal Guardian Notified of Arrival: -- (N/A)  Legal Guardian Notified of Pending Discharge: -- (N/A)  If Minor and Not Living with Parent(s), Who has Custody? N/A  Is CPS involved or ever been involved? Never  Is APS involved or ever been involved? Never   Patient Determined To Be At Risk for Harm To Self or Others Based on Review of Patient Reported Information or Presenting Complaint? Yes, for Self-Harm (denies HI)  Method: No Plan (Denies HI)  Availability of Means: No access  or NA (Denies HI)  Intent: Vague intent or NA (Denies HI)  Notification Required: No need or identified person (Denies HI)  Additional Information for Danger to Others Potential: -- (N/A)  Additional Comments for Danger to Others Potential: N/A  Are There Guns or Other Weapons in Your Home? No  Types of Guns/Weapons: N/A  Are These Weapons Safely Secured?                            -- (N/A)  Who Could Verify You Are Able To Have These Secured: N/A  Do You Have any Outstanding Charges, Pending Court Dates, Parole/Probation? Patient denies  Contacted To Inform of Risk of Harm To Self or Others: -- (N/A)    Does Patient Present under Involuntary Commitment? No    [place] of Residence: [place]   Patient Currently Receiving the Following Services: Not Receiving Services   Determination of Need: Urgent (48 hours)   Options For Referral: Inpatient Hospitalization; Medication Management; Outpatient Therapy     CCA Biopsychosocial Patient Reported Schizophrenia/Schizoaffective Diagnosis in Past: Yes   Strengths: Patient seeking help   Mental Health Symptoms Depression:   Hopelessness; Tearfulness   Duration of Depressive symptoms: Duration of Depressive Symptoms: Less than two weeks   Mania:   None   Anxiety:    None   Psychosis:   Hallucinations   Duration of Psychotic symptoms: Duration of Psychotic Symptoms: Less than six months  Trauma:   None   Obsessions:   None   Compulsions:   None   Inattention:   None   Hyperactivity/Impulsivity:   None   Oppositional/Defiant Behaviors:   None   Emotional Irregularity:   None   Other Mood/Personality Symptoms:   N/A    Mental Status Exam Appearance and self-care  Stature:   Small   Weight:   Thin   Clothing:   -- (Scrubs)   Grooming:   Normal   Cosmetic use:   None   Posture/gait:   Normal   Motor activity:   Agitated   Sensorium  Attention:   Normal    Concentration:   Normal   Orientation:   X5   Recall/memory:   Normal   Affect and Mood  Affect:   Depressed; Tearful   Mood:   Depressed   Relating  Eye contact:   Normal   Facial expression:   Depressed   Attitude toward examiner:   Cooperative   Thought and Language  Speech flow:  Garbled   Thought content:   Appropriate to Mood and Circumstances   Preoccupation:   None   Hallucinations:   Auditory   Organization:   Patent examiner of Knowledge:   Average   Intelligence:   Average   Abstraction:   Concrete   Judgement:   Impaired   Reality Testing:   Adequate   Insight:   Lacking   Decision Making:   Only simple   Social Functioning  Social Maturity:   Isolates   Social Judgement:   Normal   Stress  Stressors:   Other (Comment) (Patient states she does not know.)   Coping Ability:   Overwhelmed   Skill Deficits:   Decision making   Supports:   Support needed     Religion: Religion/Spirituality Are You A Religious Person?: No How Might This Affect Treatment?: N/A  Leisure/Recreation: Leisure / Recreation Do You Have Hobbies?: No  Exercise/Diet: Exercise/Diet Do You Exercise?: No Have You Gained or Lost A Significant Amount of Weight in the Past Six Months?: No Do You Follow a Special Diet?: No Do You Have Any Trouble Sleeping?: No   CCA Employment/Education Employment/Work Situation: Employment / Work Situation Employment Situation: Unemployed Patient's Job has Been Impacted by Current Illness: No Has Patient ever Been in Equities trader?: No  Education: Education Is Patient Currently Attending School?: No Last Grade Completed: 12 Did You Product manager?: No Did You Have An Individualized Education Program (IIEP): No Did You Have Any Difficulty At [employer]?: No Patient's Education Has Been Impacted by Current Illness: No   CCA Family/Childhood History Family and Relationship  History: Family history Marital status: Single Does patient have children?: Yes How many children?: 1 How is patient's relationship with their children?: Reports haven't spoken to daughter in months.  Childhood History:  Childhood History By whom was/is the patient raised?: Both parents Did patient suffer any verbal/emotional/physical/sexual abuse as a child?: No Did patient suffer from severe childhood neglect?: No Has patient ever been sexually abused/assaulted/raped as an adolescent or adult?: No Was the patient ever a victim of a crime or a disaster?: No Witnessed domestic violence?: No Has patient been affected by domestic violence as an adult?: No       CCA Substance Use Alcohol/Drug Use: Alcohol / Drug Use Pain Medications: See MAR Prescriptions: See MAR Over the Counter: See MAR History of alcohol / drug use?: No history of alcohol /  drug abuse (denies recent substance use) Longest period of sobriety (when/how long): N/A Negative Consequences of Use:  (N/A) Withdrawal Symptoms:  (N/A)                         ASAM's:  Six Dimensions of Multidimensional Assessment  Dimension 1:  Acute Intoxication and/or Withdrawal Potential:      Dimension 2:  Biomedical Conditions and Complications:      Dimension 3:  Emotional, Behavioral, or Cognitive Conditions and Complications:     Dimension 4:  Readiness to Change:     Dimension 5:  Relapse, Continued use, or Continued Problem Potential:     Dimension 6:  Recovery/Living Environment:     ASAM Severity Score:    ASAM Recommended Level of Treatment:     Substance use Disorder (SUD)    Recommendations for Services/Supports/Treatments:    Discharge Disposition:    DSM5 Diagnoses: Patient Active Problem List   Diagnosis Date Noted   History of amphetamine dependence/abuse (HCC) 07/25/2022   Chronic paranoid schizophrenia (HCC) 07/23/2022   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   Altered  mental state 04/08/2021   Substance abuse (HCC) 04/06/2021   Depression 04/02/2021   Hepatitis C antibody positive in blood 06/29/2016   Tobacco use disorder 06/28/2016   Amphetamine use disorder, severe (HCC) 06/28/2016   Cannabis use disorder, severe, dependence (HCC) 06/28/2016     Referrals to Alternative Service(s): Referred to Alternative Service(s):   Place:   Date:   Time:    Referred to Alternative Service(s):   Place:   Date:   Time:    Referred to Alternative Service(s):   Place:   Date:   Time:    Referred to Alternative Service(s):   Place:   Date:   Time:     Cleda Clarks, LCSW

## 2023-02-18 NOTE — ED Provider Notes (Signed)
Patient recommended for overnight observation.  Will transfer to [facility].  EMTALA completed.   Shon Baton, MD 02/18/23 [phone]

## 2023-02-18 NOTE — ED Provider Notes (Signed)
Emergency Medicine Observation Re-evaluation Note  Emily [name] is a 37 y.o. female, seen on rounds today.  Pt initially presented to the ED for complaints of Suicidal Currently, the patient is resting comfortably.  Physical Exam  BP 91/64 (BP Location: Left Arm)   Pulse 63   Temp 97.7 F (36.5 C) (Oral)   Resp 20   SpO2 100%  Physical Exam General: No distress Cardiac: Well-perfused Lungs: Clear lungs Psych: Calm  ED Course / MDM  EKG:   I have reviewed the labs performed to date as well as medications administered while in observation.  Recent changes in the last 24 hours include assessed by psychiatry and cleared psychiatrically..  Plan  Current plan is for discharge.  She is awake and alert.  She denies suicidal thoughts or homicidal thoughts.  She has been cleared by psychiatry for discharge.Glynn Octave, MD 02/18/23 [phone]

## 2023-02-18 NOTE — ED Notes (Signed)
D/c paperwork given to pt along with all belongings & a bus pass.

## 2023-02-18 NOTE — ED Notes (Signed)
Pt in the shower at this time 

## 2023-02-18 NOTE — Discharge Instructions (Signed)
Outpatient psychiatric Services  Walk in hours for medication management Monday, Wednesday, Thursday, and Friday from 8:00 AM to 11:00 AM Recommend arriving by by 7:30 AM.  It is first come first serve.    Walk in hours for therapy intake Monday and Wednesday only 8:00 AM to 11:00 AM Encouraged to arrive by 7:30 AM.  It is first come first serve   Inpatient patient psychiatric services [facility] offers comprehensive behavioral heath care services for mental health and substance abuse treatment.  Social work can also assist with referral to or getting you into a rehabilitation program short or long term  Engineer, [facility] Center [facility]) Monday - Friday 8am - 3pm          Sat & Sun 8am - 2pm [address] [place], [place] [address]   [phone]     www.interactiveresourcecenter.org IRC offers among other critical resources: showers, laundry, barbershop, phone bank, mailroom, computer lab, medical clinic, gardens and a bike maintenance area.   AREA SHELTERS  [place] [facility] [employer]  (Men & women) [address] [place] [phone]  [facility] [place] (Men/women/families) [address] [place] [phone]   [facility] (Families with children) [phone]  [place] [phone]   [facility] (Domestic Violence Shelter) [address]  [place] [phone]   [facility] (Children ages 49-17) [address]  [place] [phone]   [facility]   (Women & children) [address] [place] [phone]   [facility] (Women/substance abuse) [address]  [place] [phone]   [facility] (Men) [address]  [place] [phone]   [facility] (Men) [address]  [place] [phone]  [employer] (Women) [phone]  [place] [phone]   [facility] (Single women & women with  children) [address]  [place] [phone]  [facility] (Men/women/families) [address] [phone]    [facility]   (Domestic Violence shelter) [address]  [place] [phone]   [facility] (Men & women) [address]  [place] [phone]  [facility] (Men) [address]  [place] [phone]   [facility] (Men) [address]  [place] [phone]   Holiday representative (Single women & families) [address]  [place] [phone]  [facility]. (Men/women & families) [address]  [place] [phone]    If you are at risk of losing your housing (throughout [facility]) call the Housing Hotline at 986-507-6900. You may also contact 2-1-1, a FREE service of the [place] [address] that provides information about many resources including housing. Dial 211, or visit online at PooledIncome.pl. [facility]:  [facility] of [place], contact Janelle Floor [phone] (men only)                          Huey Romans in [place]:  90 day homeless program for women and men;                             contact Rev. Chambers [phone]  [facility]:  [facility]:  men/women/children [phone]  [facility]:  Shelter in [place] [phone]                       [employer] in [place], [place] [phone]   [facility]:  Crisis Council for Abused Women, [phone]; (women and children)  [facility]:  [employer], men/women/children; [phone]                           [facility] in [place], [phone]; substance abuse halfway house for men              [facility]; 4 bedroom house in [facility] for homeless women, contact Jenne Campus [phone]               [facility] in [place], [phone] (women and children)               Friend to Friend, for abused women and  children, 24 hour crisis line, [phone], Jamison [place]  [facility], halfway house for women, [place], [phone]  [facility]:  [facility], [phone]; open Mon-Thurs from TKZ60 - March 15 when temp is below 32 degrees                              [facility]; Alwyn Pea [place], [phone]; cell [phone]; open 24/7  [facility]:  Outreach for [place] - Rev Ladona Ridgel [phone]  [place]:  transitional housing for women and children; Arminda Resides [phone] we are unable to provide direct housing, financial, or social services, [employer] cares deeply for this community. We would be happy to connect you with other organizations in our community that may be able to provide assistance. We invite you to connect with [place] for spiritual support and prayer during difficult times. Our hearts are with you, and we hope these resources can help point you in a positive direction. Please reach out if we can provide additional support.  [facility] Address: [address], [place], [place] [address] Phone: [phone] Website: View Here Hours: 9am-4 pm. (closed 12:30 for lunch)  Description: You can come to [place] for help with your resume and cover letter. We help instruct individuals on how to apply for jobs online and give tips on how to prepare for interviews. We host hiring events every week and our helpful staff can work with you on a one-on-one basis.  [employer] Surveyor, minerals Address: [address] [place], [place] [address] Phone: [phone] Website: View Here Hours: M,T,TH: 9:00am-4:00pm W: 9:00am-6:00pm F: 9:00am-2:00pm  Description: [employer] is Statistician. Job seekers can Financial controller for jobs, create resumes, and find education and training. Employers can find candidates, post jobs, and Sales executive.  [facility] [employer] [facility] Address: [address]  [place], [place], [place] [address] Phone: [phone] Website: View Here Hours: M-F: 8:30am-5pm  Description: [employer] is a 5 (c)(3) organization that provides free job readiness training, active mentoring, and supportive services to help individuals find and keep jobs and build stable lives.  [employer] Address: [address] [place], [place] [address] Phone: [phone] Website: View Here Hours: M-F: 8:30am-5pm  Description: Employment program for formerly incarcerated Field seismologist Location: Address: [address] [phone]  Phone: [phone] Website: View Here Hours: Unknown  [place] Location: Address: [address], Suite [place] [address] Phone: [phone] Website: View Here Hours: Unknown  Description: Support of employment for individuals with a disability

## 2023-02-18 NOTE — ED Notes (Signed)
RN attempted to have patient sign rider waiver for transfer to [facility] patient still remains confused and have random outburst at times. Pt seemed paranoid when advised of impending transfer and kept screaming "where am I going!?"  "Who's coming??"; RN advised [facility] that patient may not be appropriate and EDP advised as well though secure chat; Koleen Nimrod

## 2023-02-18 NOTE — Consult Note (Signed)
BH ED ASSESSMENT   Reason for Consult:  Eval Referring Physician:  Horton Patient Identification: Emily [name] MRN:  [id number] ED Chief Complaint: Altered mental state  Diagnosis:  Principal Problem:   Altered mental state Active Problems:   Substance abuse [facility])   ED Assessment Time Calculation: Start Time: 1000 Stop Time: 1045 Total Time in Minutes (Assessment Completion): 45   HPI:   Emily [name] is a 37 y.o. female patient presents to [facility] ED voluntarily, via EMS after being found walking around in a parking lot. Per chart review, patient has a history of schizophrenia. Patient reports she has been depressed and suicidal for a day. Patient is unable to recall why she was walking around in a parking lot. She says, "I was wandering off I guess". Patient endorses auditory hallucinations of people talking. The voices are not commanding. Patient denies any history of self-harm. Patient denies visual hallucinations or HI. Patient denies access to guns or weapons. Patient denies recent substance use.    Patient is unable to identify stressors. Patient initially reports she lives with her friend Emily [name]," however, she later states she has been homeless for the past week.  Patient is unemployed and says she does not receive disability. Patient denies having supports. Per chart, patient has a sister and daughter. Patient declines to have family contacted for collateral. Patient denies any history of abuse and denies legal problems.    Patient is currently not receiving mental health services. Patient reports she is supposed to be taking medication, however she stopped takin medication six months ago due to feeling like she did not need it. Per chart review, patient hospitalized at [facility] 07/23/2022-07/31/2022 and 04/02/2021-04/11/2021. Per EDP note, patient has also been seen at [facility] several times.   Subjective:   Patient seen at [facility], ED for face-to-face psychiatric evaluation.   She is pleasant, cooperative, and willing to engage in assessment.  She is alert and oriented x 4.  Patient tells me yesterday was little bit of a blur she think she was dehydrated and is unsure why she was walking around a parking lot yesterday confused.  Patient does confirm she has not taken medications in around 6 months and has had some intermittent substance abuse mostly heroin.  She also occasionally drinks alcohol.  She has not used heroin in around 1 month, drink alcohol around a week or 2 ago.  She denies wanting any substance abuse treatment.  She does confirm she is homeless, states she has plans to go to the [facility].  She denies any suicidal ideations.  Does report a previous suicide attempt around 6+ years ago.  She reports being depressed and feeling down every once in a while but states she has not had any problems with suicidal ideations recently.  She denies homicidal ideations.  Denies any auditory or visual hallucinations.  She reports getting really good sleep last night, sometimes she struggles with sleep depending on where she is staying.  Ultimately patient is able to contract for safety and is requesting to discharge today.  She denies having access to weapons or firearms.  She has coherent and logical thought content.  Does not appear to be responding to internal stimuli, no concerns for psychosis or mania at this time.  She has a good follow-up plan in place.  She plans on presenting to behavioral health urgent care this week to establish outpatient follow-up and consider restarting medications.  She does not want to restart anything  at this time.  Patient does not meet [facility] IVC criteria, will psychiatrically clear patient. Will provide resources in AVS and offer bus pass.   Past Psychiatric History:  Schizophrenia, polysubstance abuse, substance induced mood disorders  Risk to Self or Others: Is the patient at risk to self? No Has the patient been a risk to self in the  past 6 months? No Has the patient been a risk to self within the distant past? Yes Is the patient a risk to others? No Has the patient been a risk to others in the past 6 months? No Has the patient been a risk to others within the distant past? No  [place] Scale:  Flowsheet Row ED from 02/17/2023 in  [facility] Emergency Department at [facility] ED from 10/23/2022 in [facility] Emergency Department at [facility] ED from 09/28/2022 in [facility]  C-SSRS RISK CATEGORY Low Risk No Risk Moderate Risk       Substance Abuse:  Alcohol / Drug Use Pain Medications: See [facility] Prescriptions: See MAR Over the Counter: See MAR History of alcohol / drug use?: No history of alcohol / drug abuse (denies recent substance use) Longest period of sobriety (when/how long): N/A Negative Consequences of Use:  (N/A) Withdrawal Symptoms:  (N/A)  Past Medical History:  Past Medical History:  Diagnosis Date   Anxiety    Anxiety    Depression    Schizophrenia (HCC)     Past Surgical History:  Procedure Laterality Date   CHOLECYSTECTOMY     Family History: History reviewed. No pertinent family history. Social History:  Social History   Substance and Sexual Activity  Alcohol Use Not Currently   Comment: 2-3 drinks but none recently per pt report     Social History   Substance and Sexual Activity  Drug Use Not Currently   Types: Methamphetamines   Comment: "SEVERAL MONTHS AGO"    Social History   Socioeconomic History   Marital status: Single    Spouse name: Not on file   Number of children: Not on file   Years of education: Not on file   Highest education level: Not on file  Occupational History   Not on file  Tobacco Use   Smoking status: Every Day    Passive exposure: Past   Smokeless tobacco: Never  Substance and Sexual Activity   Alcohol use: Not Currently    Comment: 2-3 drinks but none recently per pt report   Drug use: Not  Currently    Types: Methamphetamines    Comment: "SEVERAL MONTHS AGO"   Sexual activity: Yes    Partners: Male  Other Topics Concern   Not on file  Social History Narrative   Not on file   Social Determinants of Health   Financial Resource Strain: Not on file  Food Insecurity: Not on file  Transportation Needs: Not on file  Physical Activity: Not on file  Stress: Not on file  Social Connections: Not on file   Allergies:   Allergies  Allergen Reactions   Bee Venom Hives    Labs:  Results for orders placed or performed during the hospital encounter of 02/17/23 (from the past 48 hour(s))  Urine rapid drug screen (hosp performed)     Status: None   Collection Time: 02/17/23  2:00 PM  Result Value Ref Range   Opiates NONE DETECTED NONE DETECTED   Cocaine NONE DETECTED NONE DETECTED   Benzodiazepines NONE DETECTED NONE DETECTED  Amphetamines NONE DETECTED NONE DETECTED   Tetrahydrocannabinol NONE DETECTED NONE DETECTED   Barbiturates NONE DETECTED NONE DETECTED    Comment: (NOTE) DRUG SCREEN FOR MEDICAL PURPOSES ONLY.  IF CONFIRMATION IS NEEDED FOR ANY PURPOSE, NOTIFY LAB WITHIN 5 DAYS.  LOWEST DETECTABLE LIMITS FOR URINE DRUG SCREEN Drug Class                     Cutoff (ng/mL) Amphetamine and metabolites    1000 Barbiturate and metabolites    200 Benzodiazepine                 200 Opiates and metabolites        300 Cocaine and metabolites        300 THC                            50 Performed at [facility], [address], [place], [place] [address]   Comprehensive metabolic panel     Status: Abnormal   Collection Time: 02/17/23  2:13 PM  Result Value Ref Range   Sodium 139 135 - 145 mmol/L   Potassium 3.6 3.5 - 5.1 mmol/L   Chloride 107 98 - 111 mmol/L   CO2 24 22 - 32 mmol/L   Glucose, Bld 96 70 - 99 mg/dL    Comment: Glucose reference range applies only to samples taken after fasting for at least 8 hours.   BUN 14 6 - 20 mg/dL   Creatinine, Ser  6.04 0.44 - 1.00 mg/dL   Calcium 8.7 (L) 8.9 - 10.3 mg/dL   Total Protein 6.0 (L) 6.5 - 8.1 g/dL   Albumin 3.5 3.5 - 5.0 g/dL   AST 50 (H) 15 - 41 U/L   ALT 33 0 - 44 U/L   Alkaline Phosphatase 33 (L) 38 - 126 U/L   Total Bilirubin 0.2 (L) 0.3 - 1.2 mg/dL   GFR, Estimated >54 >09 mL/min    Comment: (NOTE) Calculated using the CKD-EPI Creatinine Equation (2021)    Anion gap 8 5 - 15    Comment: Performed at [facility], [address] [place], [place] [address]  Ethanol     Status: None   Collection Time: 02/17/23  2:13 PM  Result Value Ref Range   Alcohol, Ethyl (B) <10 <10 mg/dL    Comment: (NOTE) Lowest detectable limit for serum alcohol is 10 mg/dL.  For medical purposes only. Performed at [facility], [address], [place], [place] [address]   CBC with Diff     Status: Abnormal   Collection Time: 02/17/23  2:13 PM  Result Value Ref Range   WBC 6.3 4.0 - 10.5 K/uL   RBC 3.67 (L) 3.87 - 5.11 MIL/uL   Hemoglobin 11.3 (L) 12.0 - 15.0 g/dL   HCT 56.2 (L) 13.0 - 86.5 %   MCV 93.5 80.0 - 100.0 fL   MCH 30.8 26.0 - 34.0 pg   MCHC 32.9 30.0 - 36.0 g/dL   RDW 78.4 69.6 - 29.5 %   Platelets 297 150 - 400 K/uL   nRBC 0.0 0.0 - 0.2 %   Neutrophils Relative % 48 %   Neutro Abs 3.0 1.7 - 7.7 K/uL   Lymphocytes Relative 39 %   Lymphs Abs 2.4 0.7 - 4.0 K/uL   Monocytes Relative 10 %   Monocytes Absolute 0.6 0.1 - 1.0 K/uL   Eosinophils Relative  3 %   Eosinophils Absolute 0.2 0.0 - 0.5 K/uL   Basophils Relative 0 %   Basophils Absolute 0.0 0.0 - 0.1 K/uL   Immature Granulocytes 0 %   Abs Immature Granulocytes 0.01 0.00 - 0.07 K/uL    Comment: Performed at [facility], [address] [place], [place] [address]  I-Stat beta hCG blood, ED     Status: None   Collection Time: 02/17/23  2:15 PM  Result Value Ref Range   I-stat hCG, quantitative <5.0 <5 mIU/mL   Comment 3            Comment:   GEST. AGE      CONC.  (mIU/mL)   <=1 WEEK        5 - 50     2  WEEKS       50 - 500     3 WEEKS       100 - 10,000     4 WEEKS     1,000 - 30,000        FEMALE AND NON-PREGNANT FEMALE:     LESS THAN 5 mIU/mL     Current Facility-Administered Medications  Medication Dose Route Frequency Provider Last Rate Last Admin   acetaminophen (TYLENOL) tablet 650 mg  650 mg Oral Q6H PRN Mardene Sayer, MD       ibuprofen (ADVIL) tablet 400 mg  400 mg Oral Q6H PRN Mardene Sayer, MD   400 mg at 02/18/23 9147   Current Outpatient Medications  Medication Sig Dispense Refill   benztropine (COGENTIN) 0.5 MG tablet Take 1 tablet (0.5 mg total) by mouth 2 (two) times daily. (Patient not taking: Reported on 02/17/2023) 60 tablet 0   haloperidol (HALDOL) 5 MG tablet Take one tablet in the morning and 2 in the evening (Patient not taking: Reported on 02/17/2023) 45 tablet 0   Psychiatric Specialty Exam: Presentation  General Appearance:  Appropriate for Environment  Eye Contact: Good  Speech: Clear and Coherent  Speech Volume: Normal  Handedness: Right   Mood and Affect  Mood: Euthymic  Affect: Congruent   Thought Process  Thought Processes: Goal Directed  Descriptions of Associations:Intact  Orientation:Full (Time, Place and Person)  Thought Content:WDL  History of Schizophrenia/Schizoaffective disorder:Yes  Duration of Psychotic Symptoms:Less than six months  Hallucinations:Hallucinations: None  Ideas of Reference:None  Suicidal Thoughts:Suicidal Thoughts: No  Homicidal Thoughts:Homicidal Thoughts: No   Sensorium  Memory: Immediate Fair; Recent Fair  Judgment: Fair  Insight: Fair   Art therapist  Concentration: Good  Attention Span: Good  Recall: Good  Fund of Knowledge: Good  Language: Good   Psychomotor Activity  Psychomotor Activity: Psychomotor Activity: Normal   Assets  Assets: Desire for Improvement; Leisure Time; Physical Health; Resilience    Sleep  Sleep: Sleep:  Fair   Physical Exam: Physical Exam Neurological:     Mental Status: She is alert and oriented to person, place, and time.  Psychiatric:        Attention and Perception: Attention normal.        Mood and Affect: Mood normal.        Speech: Speech normal.        Behavior: Behavior is cooperative.        Thought Content: Thought content normal.    Review of Systems  Psychiatric/Behavioral:  Positive for depression and suicidal ideas.   All other systems reviewed and are negative.  Blood pressure 91/64, pulse 63, temperature 97.7 F (  36.5 C), temperature source Oral, resp. rate 20, SpO2 100 %. There is no height or weight on file to calculate BMI.  Medical Decision Making: Pt case reviewed and discussed with Dr. Clovis Riley. Pt is able to contract for safety at this time, denies SI/HI/AVH. Will psychiatrically clear patient.   -Resources provided in AVS  Disposition: No evidence of imminent risk to self or others at present.   Patient does not meet criteria for psychiatric inpatient admission. Supportive therapy provided about ongoing stressors. Discussed crisis plan, support from social network, calling 911, coming to the Emergency Department, and calling Suicide Hotline.  Eligha Bridegroom, NP 02/18/2023 11:50 AM

## 2023-03-07 ENCOUNTER — Encounter: Payer: Self-pay | Admitting: Neurology

## 2023-03-07 ENCOUNTER — Ambulatory Visit: Payer: Medicaid Other | Admitting: Neurology

## 2023-03-07 VITALS — BP 108/65 | HR 69 | Ht 63.0 in | Wt 113.5 lb

## 2023-03-07 DIAGNOSIS — R200 Anesthesia of skin: Secondary | ICD-10-CM | POA: Diagnosis not present

## 2023-03-07 DIAGNOSIS — R404 Transient alteration of awareness: Secondary | ICD-10-CM | POA: Diagnosis not present

## 2023-03-07 DIAGNOSIS — M79604 Pain in right leg: Secondary | ICD-10-CM

## 2023-03-07 MED ORDER — GABAPENTIN 300 MG PO CAPS: 300.0000 mg | ORAL_CAPSULE | Freq: Two times a day (BID) | ORAL | 11 refills | Status: AC

## 2023-03-07 NOTE — Progress Notes (Signed)
[facility]  PATIENT: Emily [name] DOB: February 26, 1986  REQUESTING CLINICIAN: Simone Curia, MD HISTORY FROM: Patient and boyfriend  REASON FOR VISIT: Right leg pain/Right leg numbness and history of seizure   HISTORICAL  CHIEF COMPLAINT:  Chief Complaint  Patient presents with   New Patient (Initial Visit)    Rm 13. Patient with boyfriend, reports having tingling and numbness up the right side of her body about a month ago and lasted about ten minutes. No reports of loss of consciousness.     HISTORY OF PRESENT ILLNESS:  This is a 37 year old woman past medical history anxiety/depression, schizophrenia who is presenting with complaint of right leg pain and numbness.  History is very limited as patient is unable to provide a very detailed history.  She tells me that the pain of all over the right leg, and she has a history of epilepsy but cannot give any additional details saying that epilepsy runs in the family.  At the moment she is complaining of right leg pain and numbness.  She is already on benztropine and Haldol for her psychiatric condition.  She does report some episode of agitation and anxiety.   OTHER MEDICAL CONDITIONS: Anxiety/Depression, Schizophrenia    REVIEW OF SYSTEMS: Full 14 system review of systems performed and negative with exception of: As noted in the HPI   ALLERGIES: Allergies  Allergen Reactions   Bee Venom Hives    HOME MEDICATIONS: Outpatient Medications Prior to Visit  Medication Sig Dispense Refill   benztropine (COGENTIN) 0.5 MG tablet Take 1 tablet (0.5 mg total) by mouth 2 (two) times daily. (Patient not taking: Reported on 02/17/2023) 60 tablet 0   haloperidol (HALDOL) 5 MG tablet Take one tablet in the morning and 2 in the evening (Patient not taking: Reported on 02/17/2023) 45 tablet 0   No facility-administered medications prior to visit.    PAST MEDICAL HISTORY: Past Medical History:  Diagnosis Date   Anxiety     Anxiety    Depression    Schizophrenia (HCC)     PAST SURGICAL HISTORY: Past Surgical History:  Procedure Laterality Date   CHOLECYSTECTOMY      FAMILY HISTORY: History reviewed. No pertinent family history.  SOCIAL HISTORY: Social History   Socioeconomic History   Marital status: Single    Spouse name: Not on file   Number of children: Not on file   Years of education: Not on file   Highest education level: Not on file  Occupational History   Not on file  Tobacco Use   Smoking status: Every Day    Passive exposure: Past   Smokeless tobacco: Never  Substance and Sexual Activity   Alcohol use: Not Currently    Comment: 2-3 drinks but none recently per pt report   Drug use: Not Currently    Types: Methamphetamines    Comment: "SEVERAL MONTHS AGO"   Sexual activity: Yes    Partners: Male  Other Topics Concern   Not on file  Social History Narrative   Not on file   Social Determinants of Health   Financial Resource Strain: Not on file  Food Insecurity: Not on file  Transportation Needs: Not on file  Physical Activity: Not on file  Stress: Not on file  Social Connections: Not on file  Intimate Partner Violence: Not on file    PHYSICAL EXAM  GENERAL EXAM/CONSTITUTIONAL: Vitals:  Vitals:   03/07/23 1357  BP: 108/65  Pulse: 69  Weight: 113 lb 8  oz (51.5 kg)  Height: 5\' 3"  (1.6 m)   Body mass index is 20.11 kg/m. Wt Readings from Last 3 Encounters:  03/07/23 113 lb 8 oz (51.5 kg)  07/21/22 110 lb (49.9 kg)  02/09/22 117 lb 9.6 oz (53.3 kg)   Patient is in no distress; thin woman, appears disheveled and she is edentulous   MUSCULOSKELETAL: Gait, strength, tone, movements noted in Neurologic exam below  NEUROLOGIC: MENTAL STATUS:      No data to display         awake, alert, oriented to person, place and time recent and remote memory intact normal attention and concentration language fluent, comprehension intact, naming intact fund of  knowledge appropriate  CRANIAL NERVE:  2nd, 3rd, 4th, 6th - Visual fields full to confrontation, extraocular muscles intact, no nystagmus 5th - facial sensation symmetric 7th - facial strength symmetric 8th - hearing intact 9th - palate elevates symmetrically, uvula midline 11th - shoulder shrug symmetric 12th - tongue protrusion midline  MOTOR:  normal bulk and tone, full strength in the BUE, BLE  SENSORY:  normal and symmetric to light touch  COORDINATION:  finger-nose-finger, fine finger movements normal  REFLEXES:  deep tendon reflexes present and symmetric  GAIT/STATION:  normal     DIAGNOSTIC DATA (LABS, IMAGING, TESTING) - I reviewed patient records, labs, notes, testing and imaging myself where available.  Lab Results  Component Value Date   WBC 6.3 02/17/2023   HGB 11.3 (L) 02/17/2023   HCT 34.3 (L) 02/17/2023   MCV 93.5 02/17/2023   PLT 297 02/17/2023      Component Value Date/Time   NA 139 02/17/2023 1413   K 3.6 02/17/2023 1413   CL 107 02/17/2023 1413   CO2 24 02/17/2023 1413   GLUCOSE 96 02/17/2023 1413   BUN 14 02/17/2023 1413   CREATININE 0.73 02/17/2023 1413   CALCIUM 8.7 (L) 02/17/2023 1413   PROT 6.0 (L) 02/17/2023 1413   ALBUMIN 3.5 02/17/2023 1413   AST 50 (H) 02/17/2023 1413   ALT 33 02/17/2023 1413   ALKPHOS 33 (L) 02/17/2023 1413   BILITOT 0.2 (L) 02/17/2023 1413   GFRNONAA >60 02/17/2023 1413   GFRAA >60 10/21/2015 0625   Lab Results  Component Value Date   CHOL 159 07/24/2022   HDL 56 07/24/2022   LDLCALC 93 07/24/2022   TRIG 51 07/24/2022   CHOLHDL 2.8 07/24/2022   Lab Results  Component Value Date   HGBA1C 4.3 (L) 07/24/2022   Lab Results  Component Value Date   VITAMINB12 274 04/05/2021   Lab Results  Component Value Date   TSH 1.365 07/24/2022   CT head 07/21/2022 No evidence of acute intracranial abnormality.    ASSESSMENT AND PLAN  37 y.o. year old female with history of anxiety/depression,  schizophrenia who is presenting with complaint of right leg pain and numbness.  I will start the patient on gabapentin 300 mg twice daily as this may also help with the underlying anxiety and agitation.  Boyfriend reports that patient also had episode of shaking, patient tells me that she has a history of epilepsy and epilepsy runs in the family.  Will obtain routine EEG.  I will contact her to go over the results.  Continue to follow with your PCP return as needed.   1. Right leg pain   2. Right leg numbness   3. Transient alteration of awareness      Patient Instructions  Start gabapentin 300 mg twice daily Routine  EEG Continue your other medications Continue to follow up with PCP Follow-up as needed  Orders Placed This Encounter  Procedures   EEG adult    Meds ordered this encounter  Medications   gabapentin (NEURONTIN) 300 MG capsule    Sig: Take 1 capsule (300 mg total) by mouth 2 (two) times daily.    Dispense:  60 capsule    Refill:  11    NO EARLY REFILLS    Return if symptoms worsen or fail to improve.    Windell Norfolk, MD 03/07/2023, 4:15 PM  [facility] [address], [address] [place], [place] [address] [phone]

## 2023-03-07 NOTE — Patient Instructions (Signed)
Start gabapentin 300 mg twice daily Routine EEG Continue your other medications Continue to follow up with PCP Follow-up as needed

## 2023-03-21 ENCOUNTER — Ambulatory Visit: Payer: Medicaid Other | Admitting: Neurology

## 2023-03-21 DIAGNOSIS — R410 Disorientation, unspecified: Secondary | ICD-10-CM | POA: Diagnosis not present

## 2023-03-21 DIAGNOSIS — R404 Transient alteration of awareness: Secondary | ICD-10-CM

## 2023-03-21 DIAGNOSIS — R200 Anesthesia of skin: Secondary | ICD-10-CM

## 2023-03-28 NOTE — Procedures (Signed)
   HISTORY: 37 year old female reported history of epilepsy  TECHNIQUE:  This is a routine 16 channel EEG recording with one channel devoted to a limited EKG recording.  It was performed during wakefulness, drowsiness and asleep.  Hyperventilation and photic stimulation were performed as activating procedures.  There are frequent eye blinking artifact  Upon maximum arousal, posterior dominant waking rhythm consistent of rhythmic alpha range activity. Activities are symmetric over the bilateral posterior derivations and attenuated with eye opening.  Photic stimulation did not alter the tracing.  Hyperventilation produced mild/moderate buildup with higher amplitude and the slower activities noted.  During EEG recording, patient developed drowsiness and no deeper stage of sleep was achieved During EEG recording, there was no epileptiform discharge noted.  EKG demonstrate normal sinus rhythm.  CONCLUSION: This is a  normal awake EEG.  There is no electrodiagnostic evidence of epileptiform discharge.  Levert Feinstein, [name] Ph.D.  [facility] [address] [place], [place] [address] Phone: [phone] Fax:      [phone]

## 2023-08-09 ENCOUNTER — Encounter (HOSPITAL_COMMUNITY): Payer: Self-pay | Admitting: Emergency Medicine

## 2023-08-09 ENCOUNTER — Emergency Department (HOSPITAL_COMMUNITY)
Admission: EM | Admit: 2023-08-09 | Discharge: 2023-08-10 | Disposition: A | Discharge status: Home / Self Care | Payer: MEDICAID | Attending: Emergency Medicine | Admitting: Emergency Medicine

## 2023-08-09 ENCOUNTER — Emergency Department (HOSPITAL_COMMUNITY): Payer: MEDICAID

## 2023-08-09 DIAGNOSIS — R4182 Altered mental status, unspecified: Secondary | ICD-10-CM | POA: Diagnosis present

## 2023-08-09 DIAGNOSIS — R29818 Other symptoms and signs involving the nervous system: Secondary | ICD-10-CM | POA: Diagnosis not present

## 2023-08-09 DIAGNOSIS — R799 Abnormal finding of blood chemistry, unspecified: Secondary | ICD-10-CM | POA: Diagnosis not present

## 2023-08-09 DIAGNOSIS — R4781 Slurred speech: Secondary | ICD-10-CM | POA: Diagnosis not present

## 2023-08-09 DIAGNOSIS — R4701 Aphasia: Secondary | ICD-10-CM

## 2023-08-09 DIAGNOSIS — R791 Abnormal coagulation profile: Secondary | ICD-10-CM | POA: Diagnosis not present

## 2023-08-09 LAB — DIFFERENTIAL
Abs Immature Granulocytes: 0.03 10*3/uL (ref 0.00–0.07)
Basophils Absolute: 0 10*3/uL (ref 0.0–0.1)
Basophils Relative: 0 %
Eosinophils Absolute: 0.2 10*3/uL (ref 0.0–0.5)
Eosinophils Relative: 3 %
Immature Granulocytes: 0 %
Lymphocytes Relative: 39 %
Lymphs Abs: 3.1 10*3/uL (ref 0.7–4.0)
Monocytes Absolute: 0.9 10*3/uL (ref 0.1–1.0)
Monocytes Relative: 11 %
Neutro Abs: 3.8 10*3/uL (ref 1.7–7.7)
Neutrophils Relative %: 47 %

## 2023-08-09 LAB — RAPID URINE DRUG SCREEN, HOSP PERFORMED
Amphetamines: NOT DETECTED
Barbiturates: NOT DETECTED
Benzodiazepines: NOT DETECTED
Cocaine: NOT DETECTED
Opiates: NOT DETECTED
Tetrahydrocannabinol: NOT DETECTED

## 2023-08-09 LAB — CBC
HCT: 40.4 % (ref 36.0–46.0)
Hemoglobin: 13.2 g/dL (ref 12.0–15.0)
MCH: 30.5 pg (ref 26.0–34.0)
MCHC: 32.7 g/dL (ref 30.0–36.0)
MCV: 93.3 fL (ref 80.0–100.0)
Platelets: 223 10*3/uL (ref 150–400)
RBC: 4.33 MIL/uL (ref 3.87–5.11)
RDW: 12.5 % (ref 11.5–15.5)
WBC: 8.1 10*3/uL (ref 4.0–10.5)
nRBC: 0 % (ref 0.0–0.2)

## 2023-08-09 LAB — I-STAT CHEM 8, ED
BUN: 17 mg/dL (ref 6–20)
Calcium, Ion: 1.13 mmol/L — ABNORMAL LOW (ref 1.15–1.40)
Chloride: 102 mmol/L (ref 98–111)
Creatinine, Ser: 0.7 mg/dL (ref 0.44–1.00)
Glucose, Bld: 108 mg/dL — ABNORMAL HIGH (ref 70–99)
HCT: 40 % (ref 36.0–46.0)
Hemoglobin: 13.6 g/dL (ref 12.0–15.0)
Potassium: 4.4 mmol/L (ref 3.5–5.1)
Sodium: 138 mmol/L (ref 135–145)
TCO2: 26 mmol/L (ref 22–32)

## 2023-08-09 LAB — APTT: aPTT: 30 s (ref 24–36)

## 2023-08-09 LAB — URINALYSIS, ROUTINE W REFLEX MICROSCOPIC
Bilirubin Urine: NEGATIVE
Glucose, UA: NEGATIVE mg/dL
Hgb urine dipstick: NEGATIVE
Ketones, ur: NEGATIVE mg/dL
Leukocytes,Ua: NEGATIVE
Nitrite: NEGATIVE
Protein, ur: NEGATIVE mg/dL
Specific Gravity, Urine: 1.036 — ABNORMAL HIGH (ref 1.005–1.030)
pH: 7 (ref 5.0–8.0)

## 2023-08-09 LAB — COMPREHENSIVE METABOLIC PANEL
ALT: 58 U/L — ABNORMAL HIGH (ref 0–44)
AST: 43 U/L — ABNORMAL HIGH (ref 15–41)
Albumin: 3.7 g/dL (ref 3.5–5.0)
Alkaline Phosphatase: 38 U/L (ref 38–126)
Anion gap: 6 (ref 5–15)
BUN: 14 mg/dL (ref 6–20)
CO2: 24 mmol/L (ref 22–32)
Calcium: 9.2 mg/dL (ref 8.9–10.3)
Chloride: 104 mmol/L (ref 98–111)
Creatinine, Ser: 0.57 mg/dL (ref 0.44–1.00)
GFR, Estimated: >60 mL/min (ref >60)
Glucose, Bld: 109 mg/dL — ABNORMAL HIGH (ref 70–99)
Potassium: 4.5 mmol/L (ref 3.5–5.1)
Sodium: 134 mmol/L — ABNORMAL LOW (ref 135–145)
Total Bilirubin: 0.4 mg/dL (ref <1.2)
Total Protein: 6.6 g/dL (ref 6.5–8.1)

## 2023-08-09 LAB — ETHANOL: Alcohol, Ethyl (B): <10 mg/dL (ref <10)

## 2023-08-09 LAB — PROTIME-INR
INR: 1 (ref 0.8–1.2)
Prothrombin Time: 13.1 s (ref 11.4–15.2)

## 2023-08-09 LAB — CBG MONITORING, ED: Glucose-Capillary: 111 mg/dL — ABNORMAL HIGH (ref 70–99)

## 2023-08-09 LAB — PREGNANCY, URINE: Preg Test, Ur: NEGATIVE

## 2023-08-09 MED ORDER — IOHEXOL 350 MG/ML SOLN
80.0000 mL | Freq: Once | INTRAVENOUS | Status: AC | PRN
  Administered 2023-08-09: 80 mL via INTRAVENOUS

## 2023-08-09 MED ORDER — LORAZEPAM 2 MG/ML IJ SOLN
2.0000 mg | Freq: Once | INTRAMUSCULAR | Status: AC
  Administered 2023-08-09: 2 mg via INTRAVENOUS
  Filled 2023-08-09: qty 1

## 2023-08-09 NOTE — Consult Note (Signed)
NEUROLOGY CONSULT NOTE   Date of service: August 12, 2023 Patient Name: Emily [name] MRN:  [id number] DOB:  19-May-1986 Chief Complaint: "Code stroke for slurred speech, confusion" Requesting Provider: No att. providers found  History of Present Illness  Emily [name] is a 37 y.o. female  has a past medical history of Anxiety, Anxiety, Depression, and Schizophrenia (HCC).,  Currently in jail brought in for evaluation of sudden onset of confusion and generalized weakness.  Last seen well was 12 PM and sometime this evening was noted to be noncommunicative, eyes open.  Has a history of drug abuse in the past but the law enforcement official accompanying her said that they are not suspecting any drug use in the facility.  Code stroke was activated at 1759 hrs. per the [facility] EMS dispatcher but a code stroke was not sent out on ever bridge until 1836 hrs. and that page said 25-minute ETA.  When I arrived at the ED at about 1845 hrs., the patient had already been in CT scanner by then. She was unable to provide any history.    LKW: 12 PM Modified rankin score: 1-No significant post stroke disability and can perform usual duties with stroke symptoms IV Thrombolysis: Outside the window EVT: No ELVO   ROS  Unable to ascertain due to aphasia  Past History   Past Medical History:  Diagnosis Date   Anxiety    Anxiety    Depression    Schizophrenia (HCC)     Past Surgical History:  Procedure Laterality Date   CHOLECYSTECTOMY      Family History: History reviewed. No pertinent family history.  Social History  reports that she has been smoking. She has been exposed to tobacco smoke. She has never used smokeless tobacco. She reports that she does not currently use alcohol. She reports that she does not currently use drugs after having used the following drugs: Methamphetamines.  Allergies  Allergen Reactions   Bee Venom Hives    Medications  No current facility-administered  medications for this encounter.  Current Outpatient Medications:    benztropine (COGENTIN) 0.5 MG tablet, Take 1 tablet (0.5 mg total) by mouth 2 (two) times daily. (Patient not taking: Reported on 02/17/2023), Disp: 60 tablet, Rfl: 0   gabapentin (NEURONTIN) 300 MG capsule, Take 1 capsule (300 mg total) by mouth 2 (two) times daily., Disp: 60 capsule, Rfl: 11   haloperidol (HALDOL) 5 MG tablet, Take one tablet in the morning and 2 in the evening (Patient not taking: Reported on 02/17/2023), Disp: 45 tablet, Rfl: 0  Vitals   Vitals:   12-Aug-2023 1800  Weight: 60.4 kg    Body mass index is 23.59 kg/m.  Physical Exam  General: Disheveled and appears much older than stated age. HEENT: Normocephalic atraumatic, edentulous CVS: Regular rhythm Abdomen nondistended nontender Extremities warm well-perfused Neurological exam She is awake alert, mimics commands, nonverbal. Cranial nerves: Pupils equal round react light, extraocular movements unhindered, blinks to threat from both sides, face appears symmetric, tongue and palate midline. Motor examination: No weakness or drift in any of the 4 extremities Sensation intact light touch Coordination difficult to assess given her mentation NIH stroke scale-7  Labs/Imaging/Neurodiagnostic studies   CBC:  Recent Labs  Lab Aug 12, 2023 1850  WBC 8.1  NEUTROABS 3.8  HGB 13.2  HCT 40.4  MCV 93.3  PLT 223    Basic Metabolic Panel:  Lab Results  Component Value Date   NA 139 02/17/2023   K 3.6 02/17/2023  CO2 24 02/17/2023   GLUCOSE 96 02/17/2023   BUN 14 02/17/2023   CREATININE 0.73 02/17/2023   CALCIUM 8.7 (L) 02/17/2023   GFRNONAA >60 02/17/2023   GFRAA >60 10/21/2015    Lipid Panel:  Lab Results  Component Value Date   LDLCALC 93 07/24/2022    HgbA1c:  Lab Results  Component Value Date   HGBA1C 4.3 (L) 07/24/2022    Urine Drug Screen:     Component Value Date/Time   LABOPIA NONE DETECTED 02/17/2023 1400   COCAINSCRNUR  NONE DETECTED 02/17/2023 1400   LABBENZ NONE DETECTED 02/17/2023 1400   AMPHETMU NONE DETECTED 02/17/2023 1400   THCU NONE DETECTED 02/17/2023 1400   LABBARB NONE DETECTED 02/17/2023 1400     Alcohol Level     Component Value Date/Time   ETH <10 02/17/2023 1413   CT Head without contrast(Personally reviewed): ASPECTS 10, possible cerebellar hypodensity right  CT angio Head and Neck with contrast(Personally reviewed): No ELVO.  No perfusion deficit on the CT perfusion  ASSESSMENT   Emily [name] is a 37 y.o. female  has a past medical history of Anxiety, Anxiety, Depression, and Schizophrenia (HCC).  Brought in for evaluation of sudden onset of generalized weakness and confusion.  Last seen well at 12 PM and sometime this evening noted to be noncommunicative with eyes open. Has a significant psychiatric history and history of drug abuse although less likely that she is abusing drug in the facility per the line for front officer who accompanied her. Examination more consistent with nearly catatonic state rather than focal neurological deficits associated with stroke. Concern for possible right cerebellar hypodensity on head CT-CT perfusion study does not go farther down enough to evaluate that area. Will do an Ativan trial as well as an MRI  Impression: Strokelike symptoms of aphasia, generalized weakness  RECOMMENDATIONS  MRI brain without contrast Ativan 2 mg x 1 UDS UA Chest x-ray Further recommendations based on the above imaging and clinical course Plan discussed with Dr. Wallace Cullens    Addendum 10:18 PM Mental status improving MRI brain with question subtle DWI abnormality in mid cerebellum, likely artifactual.  On my review, 1 it does not fit the patient's clinical picture and secondly is in an area prone to artifact. No further stroke workup is needed at this time She will need outpatient psychiatry care Neurology will be available as needed inpatient.  Plan was discussed  with Dr. Wallace Cullens Please call with questions  ______________________________________________________________________    Signed,  Milon Dikes [facility]

## 2023-08-09 NOTE — ED Notes (Signed)
Pt states that she is homeless and wishes to go to [facility]. Pt provided with bus pass and resource information so she can call [facility] when they open at 0800 tomorrow. Pt expresses verbal understanding and agrees with plan

## 2023-08-09 NOTE — Code Documentation (Signed)
Responded to Code Stroke called at 1836 for slurred speech, confusion, and generalized weakness, LSN-1200. Pt arrived at 1837, CBG-111, NIH-7 for LOC questions, aphasia, and dysarthria, CT head negative for acute changes. CTA/CTP-no LVO. TNK not given-pt outside window for treatment. Plan stroke w/o. Please complete VS/neuro check q2h x 12, then q4h.

## 2023-08-09 NOTE — ED Provider Notes (Signed)
[place] EMERGENCY DEPARTMENT AT [facility] Provider Note   CSN: [id number] Arrival date & time: 08/09/23  8657  An emergency department physician performed an initial assessment on this suspected stroke patient at 1837.  History  Chief Complaint  Patient presents with   Code Stroke    Emily [name] is a 37 y.o. female.  Patient is a 37 year old female with past medical history of anxiety, depression, and schizophrenia presenting from [facility] EMS for possible strokelike symptoms.  Patient was reported to have been having slurred speech and altered mental status.  Last known normal was at noon today.  On my exam patient is nonverbal but moving all 4 extremities spontaneously.  She is not answering any questions to help with HPI.  The history is provided by the patient. No language interpreter was used.       Home Medications Prior to Admission medications   Medication Sig Start Date End Date Taking? Authorizing Provider  benztropine (COGENTIN) 0.5 MG tablet Take 1 tablet (0.5 mg total) by mouth 2 (two) times daily. Patient not taking: Reported on 02/17/2023 07/31/22 08/30/22  Park Pope, MD  gabapentin (NEURONTIN) 300 MG capsule Take 1 capsule (300 mg total) by mouth 2 (two) times daily. 03/07/23 03/01/24  Windell Norfolk, MD  haloperidol (HALDOL) 5 MG tablet Take one tablet in the morning and 2 in the evening Patient not taking: Reported on 02/17/2023 07/31/22   Park Pope, MD      Allergies    Bee venom    Review of Systems   Review of Systems  Unable to perform ROS: Patient nonverbal    Physical Exam Updated Vital Signs BP 123/63   Pulse 77   Temp 98.2 F (36.8 C) (Oral)   Resp 20   Wt 60.4 kg   SpO2 96%   BMI 23.59 kg/m  Physical Exam Vitals and nursing note reviewed.  Constitutional:      General: She is not in acute distress.    Appearance: She is well-developed.  HENT:     Head: Normocephalic and atraumatic.  Eyes:      Conjunctiva/sclera: Conjunctivae normal.  Cardiovascular:     Rate and Rhythm: Normal rate and regular rhythm.     Heart sounds: No murmur heard. Pulmonary:     Effort: Pulmonary effort is normal. No respiratory distress.     Breath sounds: Normal breath sounds.  Abdominal:     Palpations: Abdomen is soft.     Tenderness: There is no abdominal tenderness.  Musculoskeletal:        General: No swelling.     Cervical back: Neck supple.  Skin:    General: Skin is warm and dry.     Capillary Refill: Capillary refill takes less than 2 seconds.  Neurological:     GCS: GCS eye subscore is 2. GCS verbal subscore is 1. GCS motor subscore is 6.     Comments: Patient intermittently obeys commands.  She is moving all 4 extremities spontaneously.  She does not follow commands for cranial nerve evaluation.  She is nonverbal at this time.  Psychiatric:        Mood and Affect: Mood normal.     ED Results / Procedures / Treatments   Labs (all labs ordered are listed, but only abnormal results are displayed) Labs Reviewed  COMPREHENSIVE METABOLIC PANEL - Abnormal; Notable for the following components:      Result Value   Sodium 134 (*)    Glucose,  Bld 109 (*)    AST 43 (*)    ALT 58 (*)    All other components within normal limits  URINALYSIS, ROUTINE W REFLEX MICROSCOPIC - Abnormal; Notable for the following components:   APPearance HAZY (*)    Specific Gravity, Urine 1.036 (*)    All other components within normal limits  CBG MONITORING, ED - Abnormal; Notable for the following components:   Glucose-Capillary 111 (*)    All other components within normal limits  I-STAT CHEM 8, ED - Abnormal; Notable for the following components:   Glucose, Bld 108 (*)    Calcium, Ion 1.13 (*)    All other components within normal limits  ETHANOL  PROTIME-INR  APTT  CBC  DIFFERENTIAL  RAPID URINE DRUG SCREEN, HOSP PERFORMED  PREGNANCY, URINE    EKG EKG Interpretation Date/Time:  Thursday  August 09 2023 19:35:07 EST Ventricular Rate:  76 PR Interval:  151 QRS Duration:  92 QT Interval:  419 QTC Calculation: 472 R Axis:   92  Text Interpretation: Sinus rhythm Borderline right axis deviation Confirmed by Edwin Dada ([id number]) on 08/09/2023 9:41:57 PM  Radiology MR BRAIN WO CONTRAST  Result Date: 08/09/2023 CLINICAL DATA:  Initial evaluation for neuro deficit, stroke suspected. EXAM: MRI HEAD WITHOUT CONTRAST TECHNIQUE: Multiplanar, multiecho pulse sequences of the brain and surrounding structures were obtained without intravenous contrast. COMPARISON:  Prior studies from earlier the same day. FINDINGS: Brain: Cerebral volume within normal limits. Few subcentimeter FLAIR hyperintensities noted involving the supratentorial cerebral white matter and pons, nonspecific, but overall minimal in nature. There is a 4 mm focus of mild diffusion signal abnormality involving the mid cerebellum, adjacent to the fourth ventricle (series 3, image 20). Apparent associated signal loss on ADC map (series 350, image 20). While this finding is suspected to be artifactual nature, a possible small acute ischemic nonhemorrhagic infarct is difficult to exclude. No other evidence for acute or subacute ischemia. Chessica Audia-white matter differentiation otherwise maintained. No areas of chronic cortical infarction. No acute or chronic intracranial blood products. No mass lesion, midline shift or mass effect. No hydrocephalus or extra-axial fluid collection. Pituitary gland and suprasellar region within normal limits. Vascular: Major intracranial vascular flow voids are maintained. Skull and upper cervical spine: Craniocervical junction within normal limits. Bone marrow signal intensity normal. No scalp soft tissue abnormality. Sinuses/Orbits: Globes orbital soft tissues within normal limits. Mild scattered mucosal thickening noted about the ethmoidal air cells. No significant mastoid effusion. Other: None. IMPRESSION: 1. 4  mm focus of mild diffusion signal abnormality involving the mid cerebellum as above. While this finding is suspected to be artifactual nature, a possible small acute ischemic nonhemorrhagic infarct is difficult to exclude, and could be considered in the correct clinical setting. 2. Otherwise essentially normal brain MRI. Electronically Signed   By: Rise Mu [name]   On: 08/09/2023 21:47   CT ANGIO HEAD NECK W WO CM W PERF (CODE STROKE)  Result Date: 08/09/2023 CLINICAL DATA:  Extremity weakness, stroke suspected EXAM: CT ANGIOGRAPHY HEAD AND NECK CT PERFUSION BRAIN TECHNIQUE: Multidetector CT imaging of the head and neck was performed using the standard protocol during bolus administration of intravenous contrast. Multiplanar CT image reconstructions and MIPs were obtained to evaluate the vascular anatomy. Carotid stenosis measurements (when applicable) are obtained utilizing NASCET criteria, using the distal internal carotid diameter as the denominator. Multiphase CT imaging of the brain was performed following IV bolus contrast injection. Subsequent parametric perfusion maps were calculated using RAPID software.  RADIATION DOSE REDUCTION: This exam was performed according to the departmental dose-optimization program which includes automated exposure control, adjustment of the mA and/or kV according to patient size and/or use of iterative reconstruction technique. CONTRAST:  80mL OMNIPAQUE IOHEXOL 350 MG/ML SOLN COMPARISON:  CT head 08/09/2023, no prior CTA available FINDINGS: CT HEAD FINDINGS For noncontrast findings, please see same day CT head. CTA NECK FINDINGS Aortic arch: Standard branching. Imaged portion shows no evidence of aneurysm or dissection. No significant stenosis of the major arch vessel origins. Right carotid system: No evidence of stenosis, dissection, or occlusion. Left carotid system: No evidence of stenosis, dissection, or occlusion. Vertebral arteries: No evidence of stenosis,  dissection, or occlusion. Skeleton: No acute osseous abnormality.  Edentulous. Other neck: No acute finding. Upper chest: No focal pulmonary opacity or pleural effusion. Review of the MIP images confirms the above findings CTA HEAD FINDINGS Anterior circulation: Both internal carotid arteries are patent to the termini, without significant stenosis. A1 segments patent. Normal anterior communicating artery. Anterior cerebral arteries are patent to their distal aspects without significant stenosis. No M1 stenosis or occlusion. MCA branches perfused to their distal aspects without significant stenosis. Posterior circulation: Vertebral arteries patent to the vertebrobasilar junction without significant stenosis. The right PICA appears patent. The left PICA is not definitively seen. Basilar patent to its distal aspect without significant stenosis. 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 Superior cerebellar arteries patent proximally. Patent P1 segments, hypoplastic on the left. Near fetal origin of the left PCA from the left posterior communicating artery. The right posterior communicating artery is also patent. PCAs perfused to their distal aspects without significant stenosis. Venous sinuses: As permitted by contrast timing, patent. Anatomic variants: Fenestration in the proximal basilar artery. Near fetal origin of the left PCA. No evidence of aneurysm or vascular malformation. Review of the MIP images confirms the above findings CT Brain Perfusion Findings: ASPECTS: 10 CBF (<30%) Volume: 0mL Perfusion (Tmax>6.0s) volume: 0mL Mismatch Volume: 0mL Infarction Location:None IMPRESSION: 1. No intracranial large vessel occlusion or significant stenosis. 2. No hemodynamically significant stenosis in the neck. 3. No evidence of infarct core or ischemic penumbra on CT perfusion, although the area of interest from the same day CT head may not be included in the CT perfusion field of view. Attention on  subsequent MRI. Imaging results were communicated on 08/09/2023 at 7:26 pm to provider Dr. Wilford Corner via secure text paging. Electronically Signed   By: Wiliam Ke [name]   On: 08/09/2023 19:26   CT HEAD CODE STROKE WO CONTRAST`  Result Date: 08/09/2023 CLINICAL DATA:  Code stroke.  Extremity weakness EXAM: CT HEAD WITHOUT CONTRAST TECHNIQUE: Contiguous axial images were obtained from the base of the skull through the vertex without intravenous contrast. RADIATION DOSE REDUCTION: This exam was performed according to the departmental dose-optimization program which includes automated exposure control, adjustment of the mA and/or kV according to patient size and/or use of iterative reconstruction technique. COMPARISON:  07/21/2022 FINDINGS: Brain: Hypodensity in the inferior right cerebellum (series 5, image 23 and series 6, image 37), concerning for acute infarct. No evidence of acute hemorrhage, mass, mass effect, or midline shift. No hydrocephalus or extra-axial collection. Vascular: No hyperdense vessel. Skull: Negative for fracture or focal lesion. Sinuses/Orbits: No acute finding. Other: The mastoid air cells are well aerated. ASPECTS [facility] Stroke Program Early CT Score) - Ganglionic level infarction (caudate, lentiform nuclei, internal capsule, insula, M1-M3 cortex): 7 - Supraganglionic infarction (M4-M6 cortex): 3 Total score (0-10 with  10 being normal): 10 IMPRESSION: 1. Hypodensity in the inferior right cerebellum, concerning for acute infarct. No evidence of acute hemorrhage. 2. ASPECTS is 10. Imaging results were communicated on 08/09/2023 at 6:55 pm to provider Dr. Wilford Corner via secure text paging. Electronically Signed   By: Wiliam Ke [name]   On: 08/09/2023 18:56    Procedures Procedures    Medications Ordered in ED Medications  iohexol (OMNIPAQUE) 350 MG/ML injection 80 mL (80 mLs Intravenous Contrast Given 08/09/23 1909)  LORazepam (ATIVAN) injection 2 mg (2 mg Intravenous Given 08/09/23  2001)    ED Course/ Medical Decision Making/ A&P                                 Medical Decision Making Amount and/or Complexity of Data Reviewed Labs: ordered.  Risk Decision regarding hospitalization.   36:31 PM 37 year old female with past medical history of anxiety, depression, and schizophrenia presenting from [facility] for possible strokelike symptoms.  Code stroke activated. CT head and neck demonstrates: 1. Hypodensity in the inferior right cerebellum, concerning for acute infarct. No evidence of acute hemorrhage. 2. ASPECTS is 10.  MRI demonstrates possible signal versus artifact.  Dr. Wilford Corner with neurology has evaluated the images and thinks this is likely artifact and does not recommend further stroke workup at this time.  Laboratory studies are stable.  No electrolyte abnormalities.  No leukocytosis.  No signs or symptoms of sepsis.  Serum pregnancy negative.  Ethanol negative.   UDS negative.    Reevaluation patient has made a full recovery from her symptoms.  She is now speaking without difficulty and moving all 4 extremities without difficulty.   Patient in no distress and overall condition improved here in the ED. Detailed discussions were had with the patient regarding current findings, and need for close f/u with PCP or on call doctor. The patient has been instructed to return immediately if the symptoms worsen in any way for re-evaluation. Patient verbalized understanding and is in agreement with current care plan. All questions answered prior to discharge.        Final Clinical Impression(s) / ED Diagnoses Final diagnoses:  None    Rx / DC Orders ED Discharge Orders     None         Franne Forts, DO 08/09/23 2232

## 2023-08-09 NOTE — ED Triage Notes (Signed)
Pt to ED via [facility] c/o code stroke. Last known well 12pm. Pt currently in jail, staff concerned d/t slurred speech and AMS. Pt following commands correctly. Hx mental health and drug abuse.   No medications given by EMS.   Last VS: 130/94 hr 75, 100%RA

## 2023-08-09 NOTE — ED Notes (Signed)
Tammy from central monitoring contacted to begin monitoring.

## 2023-08-09 NOTE — ED Notes (Signed)
Patient transported to MRI 

## 2023-08-10 NOTE — ED Notes (Signed)
Per pt request attempted to make notification with [facility].   Discharge instructions reviewed.   Opportunity for questions and concerns provided. Hospital provided paper scrubs given.   Pt verifies all belongings are in her custody and paperwork from [employer].   Alert, oriented and ambulatory to waiting area. Bus pass provided.

## 2023-08-13 ENCOUNTER — Emergency Department (HOSPITAL_COMMUNITY)
Admission: EM | Admit: 2023-08-13 | Discharge: 2023-08-15 | Disposition: A | Discharge status: Other Acute Inpatient Hospital | Payer: MEDICAID | Source: Home / Self Care | Attending: Emergency Medicine | Admitting: Emergency Medicine

## 2023-08-13 ENCOUNTER — Encounter (HOSPITAL_COMMUNITY): Payer: Self-pay

## 2023-08-13 ENCOUNTER — Other Ambulatory Visit: Payer: Self-pay

## 2023-08-13 ENCOUNTER — Emergency Department (HOSPITAL_COMMUNITY): Payer: MEDICAID

## 2023-08-13 ENCOUNTER — Emergency Department (HOSPITAL_COMMUNITY)
Admission: EM | Admit: 2023-08-13 | Discharge: 2023-08-13 | Disposition: A | Discharge status: Home / Self Care | Payer: MEDICAID | Attending: Emergency Medicine | Admitting: Emergency Medicine

## 2023-08-13 DIAGNOSIS — Z20822 Contact with and (suspected) exposure to covid-19: Secondary | ICD-10-CM | POA: Insufficient documentation

## 2023-08-13 DIAGNOSIS — F200 Paranoid schizophrenia: Secondary | ICD-10-CM | POA: Insufficient documentation

## 2023-08-13 DIAGNOSIS — R079 Chest pain, unspecified: Secondary | ICD-10-CM

## 2023-08-13 DIAGNOSIS — F32A Depression, unspecified: Secondary | ICD-10-CM | POA: Insufficient documentation

## 2023-08-13 DIAGNOSIS — R1084 Generalized abdominal pain: Secondary | ICD-10-CM

## 2023-08-13 DIAGNOSIS — R0789 Other chest pain: Secondary | ICD-10-CM | POA: Diagnosis not present

## 2023-08-13 DIAGNOSIS — Z79899 Other long term (current) drug therapy: Secondary | ICD-10-CM | POA: Insufficient documentation

## 2023-08-13 DIAGNOSIS — R1030 Lower abdominal pain, unspecified: Secondary | ICD-10-CM | POA: Insufficient documentation

## 2023-08-13 DIAGNOSIS — R109 Unspecified abdominal pain: Secondary | ICD-10-CM | POA: Diagnosis present

## 2023-08-13 LAB — CBC
HCT: 40.2 % (ref 36.0–46.0)
HCT: 40.8 % (ref 36.0–46.0)
Hemoglobin: 13.2 g/dL (ref 12.0–15.0)
Hemoglobin: 13.3 g/dL (ref 12.0–15.0)
MCH: 30.4 pg (ref 26.0–34.0)
MCH: 30.4 pg (ref 26.0–34.0)
MCHC: 32.8 g/dL (ref 30.0–36.0)
MCHC: 32.6 g/dL (ref 30.0–36.0)
MCV: 92.6 fL (ref 80.0–100.0)
MCV: 93.2 fL (ref 80.0–100.0)
Platelets: 260 10*3/uL (ref 150–400)
Platelets: 237 10*3/uL (ref 150–400)
RBC: 4.34 MIL/uL (ref 3.87–5.11)
RBC: 4.38 MIL/uL (ref 3.87–5.11)
RDW: 12.3 % (ref 11.5–15.5)
RDW: 12.4 % (ref 11.5–15.5)
WBC: 5.6 10*3/uL (ref 4.0–10.5)
WBC: 6.2 10*3/uL (ref 4.0–10.5)
nRBC: 0 % (ref 0.0–0.2)
nRBC: 0 % (ref 0.0–0.2)

## 2023-08-13 LAB — COMPREHENSIVE METABOLIC PANEL
ALT: 63 U/L — ABNORMAL HIGH (ref 0–44)
AST: 45 U/L — ABNORMAL HIGH (ref 15–41)
Albumin: 4.2 g/dL (ref 3.5–5.0)
Alkaline Phosphatase: 34 U/L — ABNORMAL LOW (ref 38–126)
Anion gap: 12 (ref 5–15)
BUN: 13 mg/dL (ref 6–20)
CO2: 19 mmol/L — ABNORMAL LOW (ref 22–32)
Calcium: 8.7 mg/dL — ABNORMAL LOW (ref 8.9–10.3)
Chloride: 104 mmol/L (ref 98–111)
Creatinine, Ser: 0.65 mg/dL (ref 0.44–1.00)
GFR, Estimated: >60 mL/min (ref >60)
Glucose, Bld: 83 mg/dL (ref 70–99)
Potassium: 3.8 mmol/L (ref 3.5–5.1)
Sodium: 135 mmol/L (ref 135–145)
Total Bilirubin: 0.9 mg/dL (ref <1.2)
Total Protein: 7.7 g/dL (ref 6.5–8.1)

## 2023-08-13 LAB — URINALYSIS, ROUTINE W REFLEX MICROSCOPIC
Bilirubin Urine: NEGATIVE
Glucose, UA: NEGATIVE mg/dL
Hgb urine dipstick: NEGATIVE
Ketones, ur: 80 mg/dL — AB
Nitrite: NEGATIVE
Protein, ur: 30 mg/dL — AB
Specific Gravity, Urine: 1.026 (ref 1.005–1.030)
pH: 5 (ref 5.0–8.0)

## 2023-08-13 LAB — TROPONIN I (HIGH SENSITIVITY)
Troponin I (High Sensitivity): <2 ng/L (ref <18)
Troponin I (High Sensitivity): 2 ng/L (ref <18)

## 2023-08-13 LAB — LIPASE, BLOOD: Lipase: 25 U/L (ref 11–51)

## 2023-08-13 LAB — HCG, SERUM, QUALITATIVE
Preg, Serum: NEGATIVE
Preg, Serum: NEGATIVE

## 2023-08-13 MED ORDER — ACETAMINOPHEN 500 MG PO TABS
1000.0000 mg | ORAL_TABLET | Freq: Once | ORAL | Status: AC
  Administered 2023-08-13: 1000 mg via ORAL
  Filled 2023-08-13: qty 2

## 2023-08-13 NOTE — ED Triage Notes (Signed)
Pt speaking excessively quietly in triage. Pt states "Depression, I guess". Pt denies SI/HI. Pt mumbling unintelligibly.

## 2023-08-13 NOTE — ED Triage Notes (Signed)
Pt c/o right lower abdominal pain and pain across entire chestx2d. Pt denies N/V/D, SOB.

## 2023-08-13 NOTE — ED Notes (Signed)
Patient transported to X-ray 

## 2023-08-13 NOTE — Discharge Instructions (Addendum)
Use Tylenol every 4 as needed for pain.  Follow-up with local doctor for recheck.  Return for new concerns. Follow-up urine culture results in 2 to 3 days with primary doctor.

## 2023-08-13 NOTE — ED Provider Notes (Signed)
[place] EMERG[facility] AT [facility] Provider Note   CSN: [id number] Arrival date & time: 08/13/23  9562     History  Chief Complaint  Patient presents with   Abdominal Pain   Chest Pain    Emily [name] is a 36 y.o. female.  Patient presents for her notes for specific abdominal pain lower chest pain for 2 days.  On and off no specific associations.  Patient has history of schizophrenia.  History anxiety.  No cardiac or blood clot history.  No recent surgeries.  Currently minimal symptoms.  No vomiting.  No blood in the stool.  No fevers.  The history is provided by the patient.  Abdominal Pain Associated symptoms: chest pain   Associated symptoms: no chills, no dysuria, no fever, no shortness of breath and no vomiting   Chest Pain Associated symptoms: abdominal pain   Associated symptoms: no back pain, no fever, no headache, no shortness of breath and no vomiting        Home Medications Prior to Admission medications   Medication Sig Start Date End Date Taking? Authorizing Provider  benztropine (COGENTIN) 0.5 MG tablet Take 1 tablet (0.5 mg total) by mouth 2 (two) times daily. Patient not taking: Reported on 02/17/2023 07/31/22 08/30/22  Park Pope, MD  gabapentin (NEURONTIN) 300 MG capsule Take 1 capsule (300 mg total) by mouth 2 (two) times daily. 03/07/23 03/01/24  Windell Norfolk, MD  haloperidol (HALDOL) 5 MG tablet Take one tablet in the morning and 2 in the evening Patient not taking: Reported on 02/17/2023 07/31/22   Park Pope, MD      Allergies    Bee venom    Review of Systems   Review of Systems  Constitutional:  Negative for chills and fever.  HENT:  Negative for congestion.   Eyes:  Negative for visual disturbance.  Respiratory:  Negative for shortness of breath.   Cardiovascular:  Positive for chest pain.  Gastrointestinal:  Positive for abdominal pain. Negative for vomiting.  Genitourinary:  Negative for dysuria and flank pain.   Musculoskeletal:  Negative for back pain, neck pain and neck stiffness.  Skin:  Negative for rash.  Neurological:  Negative for light-headedness and headaches.    Physical Exam Updated Vital Signs BP 97/60   Pulse 73   Temp 98.6 F (37 C) (Oral)   Resp (!) 25   Ht 5\' 3"  (1.6 m)   Wt 60.4 kg   SpO2 100%   BMI 23.59 kg/m  Physical Exam Vitals and nursing note reviewed.  Constitutional:      General: She is not in acute distress.    Appearance: She is well-developed.  HENT:     Head: Normocephalic and atraumatic.     Mouth/Throat:     Mouth: Mucous membranes are moist.  Eyes:     General:        Right eye: No discharge.        Left eye: No discharge.     Conjunctiva/sclera: Conjunctivae normal.  Neck:     Trachea: No tracheal deviation.  Cardiovascular:     Rate and Rhythm: Normal rate and regular rhythm.     Heart sounds: No murmur heard. Pulmonary:     Effort: Pulmonary effort is normal.     Breath sounds: Normal breath sounds.  Abdominal:     General: There is no distension.     Palpations: Abdomen is soft.     Tenderness: There is abdominal tenderness in  the suprapubic area. There is no guarding.  Musculoskeletal:     Cervical back: Normal range of motion and neck supple. No rigidity.  Skin:    General: Skin is warm.     Capillary Refill: Capillary refill takes less than 2 seconds.     Findings: No rash.  Neurological:     General: No focal deficit present.     Mental Status: She is alert.     Cranial Nerves: No cranial nerve deficit.  Psychiatric:     Comments: Calm, cooperative, answers questions, schizophrenia history     ED Results / Procedures / Treatments   Labs (all labs ordered are listed, but only abnormal results are displayed) Labs Reviewed  COMPREHENSIVE METABOLIC PANEL - Abnormal; Notable for the following components:      Result Value   CO2 19 (*)    Calcium 8.7 (*)    AST 45 (*)    ALT 63 (*)    Alkaline Phosphatase 34 (*)    All  other components within normal limits  URINALYSIS, ROUTINE W REFLEX MICROSCOPIC - Abnormal; Notable for the following components:   Color, Urine AMBER (*)    APPearance CLOUDY (*)    Ketones, ur 80 (*)    Protein, ur 30 (*)    Leukocytes,Ua SMALL (*)    Bacteria, UA MANY (*)    All other components within normal limits  CBC  HCG, SERUM, QUALITATIVE  LIPASE, BLOOD  TROPONIN I (HIGH SENSITIVITY)  TROPONIN I (HIGH SENSITIVITY)    EKG None  Radiology DG Chest 2 View  Result Date: 08/13/2023 CLINICAL DATA:  Chest pain. EXAM: CHEST - 2 VIEW COMPARISON:  Jan 24, 2023. FINDINGS: The heart size and mediastinal contours are within normal limits. Both lungs are clear. The visualized skeletal structures are unremarkable. IMPRESSION: No active cardiopulmonary disease. Electronically Signed   By: Lupita Raider [name]   On: 08/13/2023 12:36    Procedures Procedures    Medications Ordered in ED Medications  acetaminophen (TYLENOL) tablet 1,000 mg (has no administration in time range)    ED Course/ Medical Decision Making/ A&P                                 Medical Decision Making Amount and/or Complexity of Data Reviewed Labs: ordered. Radiology: ordered.  Risk OTC drugs.   Patient presents with nonspecific abdominal discomfort differential includes pancreatitis, colitis, appendicitis, cholecystitis, other.  Patient has no abdominal tenderness or pain on my exam, when asked to point suprapubic.  Patient no urinary symptoms, urinalysis reviewed and does show mild leukocytes, urine culture added on ordered.  No fever.  No indication for CT scan at this time or ultrasound.  Patient nonspecific chest pain atypical, low risk.  Patient had EKG no acute ischemic findings and troponin negative.  Patient stable for outpatient follow-up.  Oral fluids given.        Final Clinical Impression(s) / ED Diagnoses Final diagnoses:  Abdominal pain, generalized  Acute chest pain    Rx /  DC Orders ED Discharge Orders     None         Blane Ohara, MD 08/13/23 1414

## 2023-08-14 LAB — CBG MONITORING, ED
Glucose-Capillary: 62 mg/dL — ABNORMAL LOW (ref 70–99)
Glucose-Capillary: 167 mg/dL — ABNORMAL HIGH (ref 70–99)
Glucose-Capillary: 98 mg/dL (ref 70–99)

## 2023-08-14 LAB — RAPID URINE DRUG SCREEN, HOSP PERFORMED
Amphetamines: NOT DETECTED
Barbiturates: NOT DETECTED
Benzodiazepines: NOT DETECTED
Cocaine: NOT DETECTED
Opiates: NOT DETECTED
Tetrahydrocannabinol: NOT DETECTED

## 2023-08-14 LAB — COMPREHENSIVE METABOLIC PANEL
ALT: 56 U/L — ABNORMAL HIGH (ref 0–44)
AST: 40 U/L (ref 15–41)
Albumin: 3.9 g/dL (ref 3.5–5.0)
Alkaline Phosphatase: 37 U/L — ABNORMAL LOW (ref 38–126)
Anion gap: 14 (ref 5–15)
BUN: 12 mg/dL (ref 6–20)
CO2: 14 mmol/L — ABNORMAL LOW (ref 22–32)
Calcium: 8.8 mg/dL — ABNORMAL LOW (ref 8.9–10.3)
Chloride: 106 mmol/L (ref 98–111)
Creatinine, Ser: 0.75 mg/dL (ref 0.44–1.00)
GFR, Estimated: >60 mL/min (ref >60)
Glucose, Bld: 60 mg/dL — ABNORMAL LOW (ref 70–99)
Potassium: 3.6 mmol/L (ref 3.5–5.1)
Sodium: 134 mmol/L — ABNORMAL LOW (ref 135–145)
Total Bilirubin: 1.2 mg/dL — ABNORMAL HIGH (ref <1.2)
Total Protein: 7.3 g/dL (ref 6.5–8.1)

## 2023-08-14 LAB — SARS CORONAVIRUS 2 BY RT PCR: SARS Coronavirus 2 by RT PCR: NEGATIVE

## 2023-08-14 LAB — ETHANOL: Alcohol, Ethyl (B): <10 mg/dL (ref <10)

## 2023-08-14 MED ORDER — GABAPENTIN 300 MG PO CAPS
300.0000 mg | ORAL_CAPSULE | Freq: Two times a day (BID) | ORAL | Status: DC
  Administered 2023-08-14 – 2023-08-15 (×2): 300 mg via ORAL
  Filled 2023-08-14 (×2): qty 3

## 2023-08-14 MED ORDER — NALOXONE HCL 0.4 MG/ML IJ SOLN
INTRAMUSCULAR | Status: AC
  Filled 2023-08-14: qty 1

## 2023-08-14 MED ORDER — DEXTROSE 50 % IV SOLN
1.0000 | Freq: Once | INTRAVENOUS | Status: AC
  Administered 2023-08-14: 50 mL via INTRAVENOUS

## 2023-08-14 MED ORDER — DEXTROSE 50 % IV SOLN
INTRAVENOUS | Status: AC
  Filled 2023-08-14: qty 50

## 2023-08-14 MED ORDER — HALOPERIDOL 5 MG PO TABS
5.0000 mg | ORAL_TABLET | Freq: Two times a day (BID) | ORAL | Status: DC
  Administered 2023-08-14 – 2023-08-15 (×2): 5 mg via ORAL
  Filled 2023-08-14 (×2): qty 1

## 2023-08-14 MED ORDER — LORAZEPAM 2 MG/ML IJ SOLN
1.0000 mg | Freq: Once | INTRAMUSCULAR | Status: AC
  Administered 2023-08-14: 1 mg via INTRAVENOUS
  Filled 2023-08-14: qty 1

## 2023-08-14 MED ORDER — LORAZEPAM 2 MG/ML IJ SOLN
2.0000 mg | Freq: Once | INTRAMUSCULAR | Status: AC
  Administered 2023-08-14: 2 mg via INTRAVENOUS
  Filled 2023-08-14: qty 1

## 2023-08-14 MED ORDER — SODIUM CHLORIDE 0.9 % IV BOLUS
1000.0000 mL | Freq: Once | INTRAVENOUS | Status: AC
  Administered 2023-08-14: 1000 mL via INTRAVENOUS

## 2023-08-14 MED ORDER — NALOXONE HCL 4 MG/0.1ML NA LIQD
1.0000 | Freq: Once | NASAL | Status: AC
  Administered 2023-08-14: 1 via NASAL
  Filled 2023-08-14: qty 4

## 2023-08-14 NOTE — Progress Notes (Addendum)
Pt Accepted to [facility]-Pending NEG, COVID  Per ED nursing Marney Doctor informed care team that safe transport they won't be able to transport this pt until 1000 tomorrow 08/15/2023.  Care Team notified: Zachery Dauer [facility], [place]ia [place],LCSWA  Maryjean Ka, MSW, [facility] 08/14/2023 8:41 PM

## 2023-08-14 NOTE — Progress Notes (Signed)
LCSW Progress Note  [id number]   [facility]  08/14/2023  12:08 PM  Description:   Inpatient Psychiatric Referral  Patient was recommended inpatient per Rockney Ghee, NP.  There are no available beds at [facility], per [facility] Rona Ravens RN. Patient was referred to the following out of network facilities:   Destination  Service Provider Address Phone Fax  [facility]  [place] [address] [phone]  [facility]  [address] [place] [address] [phone]  [facility] [address]  [address] [place] [address] [phone]  [facility]  [address] [place], [place] [address] [phone]  [facility]  [address] [place] [address] [phone]  [facility]  [address], [place] [address] [phone]  [facility]  [address] [place] [address] [phone]  [facility]  [address], [place] [address] [phone]  [facility]  [address] [place] [address] [phone]  [facility] EFAX  [address] [place], [place] [phone]  [facility]  [address] [place], [place] [address] [phone]  [facility]  [address], [place] [address] [phone]  [facility]  [place] [phone]  [facility]  [address] [place] [address] [phone]  [facility] Patient Placement  [facility], [place] [phone]      Situation ongoing, CSW to continue following and update chart as more  information becomes available.      [place] Marvel Sapp MSW, LCSW  08/14/2023 12:08 PM

## 2023-08-14 NOTE — BH Assessment (Addendum)
Comprehensive Clinical Assessment (CCA) Note  08/14/2023 Emily [name] [id number] Disposition: Clinician discussed patient care with Rockney Ghee, NP.  She recommends inpatient care.  Clinician informed Dr. Blinda Leatherwood of disposition recommendation via secure messaging.  Pt had good eye contact and was oriented x4.  She reports hearing voices but did not appear to be responding to internal stimuli during assessment.  However patient admits to talking to herself.  A nurse noted that patient was doing that earlier in the evening.  Pt speaks clearly but does not elaborate on questions asked.  She reports sleep and appetite to be WNL.  Pt has no outpatient care.  She was at [facility] in October '23.  Was at [facility] five months ago.     Chief Complaint:  Chief Complaint  Patient presents with   Depression   Visit Diagnosis: Chronic paranoid schizophrenia    CCA Screening, Triage and Referral (STR)  Patient Reported Information How did you hear about [place]? Family/Friend (Friends that she stays with brought her to [facility].)  What Is the Reason for Your Visit/Call Today? Pt says she has been feeling very down and depressed lately.  She feels "like I need to be somewhere and getting help."  She has been having some suicidal thoughts but no plan.  Patient is currently homeless for the last month.  She has had two previous suicide attempts.  Patient says that the last atempt was about a year ago.  Patient denies any HI.  Has A/V hallucinations but not currently.  Patient admits to talking to herself sometimes.  Patient denies use of ETOH or other substances.  Patient denies having any access to a gun.  She says that sleep and appetite are "okay."  Patient does not have any outpatient care.  How Long Has This Been Causing You Problems? <Week  What Do You Feel Would Help You the Most Today? Treatment for Depression or other mood problem   Have You Recently Had Any Thoughts About Hurting Yourself?  Yes  Are You Planning to Commit Suicide/Harm Yourself At This time? No   Flowsheet Row ED from 08/13/2023 in [facility] at [facility] Most recent reading at 08/13/2023 11:18 PM ED from 08/13/2023 in [facility] Department at [facility] Most recent reading at 08/13/2023 10:00 AM ED from 02/17/2023 in [facility] Department at [facility] Most recent reading at 02/18/2023 12:32 AM  C-SSRS RISK CATEGORY No Risk No Risk Low Risk       Have you Recently Had Thoughts About Hurting Someone Emily [name]? No  Are You Planning to Harm Someone at This Time? No  Explanation: Pt with SI and no plan.   Have You Used Any Alcohol or Drugs in the Past 24 Hours? No  What Did You Use and How Much? None   Do You Currently Have a Therapist/Psychiatrist? No  Name of Therapist/Psychiatrist: Name of Therapist/Psychiatrist: No outpatient services   Have You Been Recently Discharged From Any Office Practice or Programs? No  Explanation of Discharge From Practice/Program: Pt hs had no recent discharges.  Was d/c'ed from [facility] back in July '24.     CCA Screening Triage Referral Assessment Type of Contact: Tele-Assessment  Telemedicine Service Delivery:   Is this Initial or Reassessment? Is this Initial or Reassessment?: Initial Assessment  Date Telepsych consult ordered in [facility]:  Date Telepsych consult ordered in [facility]: 08/14/23  Time Telepsych consult ordered in [facility]:  Time Telepsych consult  ordered in [facility]: 0343  Location of Assessment: [facility] ED  Provider Location: [facility] Assessment Services   Collateral Involvement: Patient declined.   Does Patient Have a Automotive engineer Guardian? No  Legal Guardian Contact Information: Pt has no legal guardian  Copy of Legal Guardianship Form: -- (Pt has no legal guardian)  Legal Guardian Notified of Arrival: -- (Pt has no legal guardian)  Legal Guardian Notified of Pending  Discharge: -- (Pt has no legal guardian)  If Minor and Not Living with Parent(s), Who has Custody? Pt is an adult.  Is CPS involved or ever been involved? Never  Is APS involved or ever been involved? Never   Patient Determined To Be At Risk for Harm To Self or Others Based on Review of Patient Reported Information or Presenting Complaint? Yes, for Self-Harm  Method: No Plan  Availability of Means: No access or NA  Intent: Vague intent or NA  Notification Required: No need or identified person  Additional Information for Danger to Others Potential: Previous attempts  Additional Comments for Danger to Others Potential: Patient has no HI.  Are There Guns or Other Weapons in Your Home? No  Types of Guns/Weapons: N/A  Are These Weapons Safely Secured?                            No  Who Could Verify You Are Able To Have These Secured: N/A  Do You Have any Outstanding Charges, Pending Court Dates, Parole/Probation? No charges.  Contacted To Inform of Risk of Harm To Self or Others: Other: Comment (N/A)    Does Patient Present under Involuntary Commitment? No    [place] of Residence: [place] (Staying with friend [place])   Patient Currently Receiving the Following Services: Not Receiving Services   Determination of Need: Urgent (48 hours)   Options For Referral: Inpatient Hospitalization     CCA Biopsychosocial Patient Reported Schizophrenia/Schizoaffective Diagnosis in Past: Yes   Strengths: Patient seeking help   Mental Health Symptoms Depression:   Hopelessness; Difficulty Concentrating; Tearfulness; Change in energy/activity   Duration of Depressive symptoms:  Duration of Depressive Symptoms: Greater than two weeks   Mania:   None   Anxiety:    Worrying; Tension   Psychosis:   Hallucinations   Duration of Psychotic symptoms:  Duration of Psychotic Symptoms: Greater than six months   Trauma:   None   Obsessions:   None    Compulsions:   None   Inattention:   None   Hyperactivity/Impulsivity:   None   Oppositional/Defiant Behaviors:   N/A   Emotional Irregularity:   Chronic feelings of emptiness   Other Mood/Personality Symptoms:   Pt has dx of chronic paranoid schizophrenia    Mental Status Exam Appearance and self-care  Stature:   Small   Weight:   Thin   Clothing:   -- (Pt in scrubbs)   Grooming:   Normal   Cosmetic use:   None   Posture/gait:   Normal (Pt laying prone in bed.)   Motor activity:   Not Remarkable   Sensorium  Attention:   Normal   Concentration:   Normal   Orientation:   X5   Recall/memory:   Normal   Affect and Mood  Affect:   Depressed; Flat   Mood:   Depressed   Relating  Eye contact:   Normal   Facial expression:   Depressed   Attitude toward examiner:  Cooperative   Thought and Language  Speech flow:  Clear and Coherent   Thought content:   Appropriate to Mood and Circumstances   Preoccupation:   None   Hallucinations:   Auditory   Organization:   Patent examiner of Knowledge:   Average   Intelligence:   Average   Abstraction:   Normal   Judgement:   Poor   Reality Testing:   Adequate   Insight:   Lacking   Decision Making:   Only simple   Social Functioning  Social Maturity:   Isolates   Social Judgement:   Normal   Stress  Stressors:   Housing   Coping Ability:   Overwhelmed   Skill Deficits:   Decision making   Supports:   Support needed     Religion: Religion/Spirituality Are You A Religious Person?: No How Might This Affect Treatment?: N/A  Leisure/Recreation: Leisure / Recreation Do You Have Hobbies?: No  Exercise/Diet: Exercise/Diet Do You Exercise?: No Have You Gained or Lost A Significant Amount of Weight in the Past Six Months?: No Do You Follow a Special Diet?: No Do You Have Any Trouble Sleeping?: No   CCA  Employment/Education Employment/Work Situation: Employment / Work Situation Employment Situation: Unemployed Patient's Job has Been Impacted by Current Illness: No Has Patient ever Been in Equities trader?: No  Education: Education Is Patient Currently Attending School?: No Last Grade Completed: 12 Did You Product manager?: No Did You Have An Individualized Education Program (IIEP): No Did You Have Any Difficulty At [employer]?: No Patient's Education Has Been Impacted by Current Illness: No   CCA Family/Childhood History Family and Relationship History: Family history Marital status: Single Does patient have children?: Yes How many children?: 1 How is patient's relationship with their children?: Reports haven't spoken to daughter in months.  Childhood History:  Childhood History By whom was/is the patient raised?: Both parents Did patient suffer any verbal/emotional/physical/sexual abuse as a child?: No Did patient suffer from severe childhood neglect?: No Has patient ever been sexually abused/assaulted/raped as an adolescent or adult?: No Was the patient ever a victim of a crime or a disaster?: No Witnessed domestic violence?: No Has patient been affected by domestic violence as an adult?: No       CCA Substance Use Alcohol/Drug Use: Alcohol / Drug Use Pain Medications: No medication Prescriptions: None Over the Counter: None History of alcohol / drug use?: No history of alcohol / drug abuse Longest period of sobriety (when/how long): N/A Withdrawal Symptoms: None                         ASAM's:  Six Dimensions of Multidimensional Assessment  Dimension 1:  Acute Intoxication and/or Withdrawal Potential:      Dimension 2:  Biomedical Conditions and Complications:      Dimension 3:  Emotional, Behavioral, or Cognitive Conditions and Complications:     Dimension 4:  Readiness to Change:     Dimension 5:  Relapse, Continued use, or Continued Problem  Potential:     Dimension 6:  Recovery/Living Environment:     ASAM Severity Score:    ASAM Recommended Level of Treatment:     Substance use Disorder (SUD)    Recommendations for Services/Supports/Treatments:    Discharge Disposition:    DSM5 Diagnoses: Patient Active Problem List   Diagnosis Date Noted   History of amphetamine dependence/abuse (HCC) 07/25/2022   Chronic paranoid schizophrenia (HCC) 07/23/2022  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   Altered mental state 04/08/2021   Substance abuse (HCC) 04/06/2021   Depression 04/02/2021   Hepatitis C antibody positive in blood 06/29/2016   Tobacco use disorder 06/28/2016   Amphetamine use disorder, severe (HCC) 06/28/2016   Cannabis use disorder, severe, dependence (HCC) 06/28/2016     Referrals to Alternative Service(s): Referred to Alternative Service(s):   Place:   Date:   Time:    Referred to Alternative Service(s):   Place:   Date:   Time:    Referred to Alternative Service(s):   Place:   Date:   Time:    Referred to Alternative Service(s):   Place:   Date:   Time:     Wandra Mannan

## 2023-08-14 NOTE — ED Notes (Signed)
Called lab to check on if they have received pt's covid swab. Per lab they will receive it in now

## 2023-08-14 NOTE — Progress Notes (Signed)
Pt has been accepted to [facility] assignment: [facility] Pending NEG, COVID   Pt meets inpatient criteria per: Eligha Bridegroom NP  Attending Physician will be: Einar Gip MD   Report can be called to: [phone]   Pt can arrive after Negative COVID test, per facility please do not call report until Transport is there to get patient.    Care Team Notified: [facility] RN Eligha Bridegroom NP    [place]ones LCSW-A   08/14/2023 1:20 PM

## 2023-08-14 NOTE — ED Provider Notes (Signed)
[place] [facility] AT [facility] Provider Note   CSN: [id number] Arrival date & time: 08/13/23  2217     History Chief Complaint  Patient presents with   Depression    Emily [name] is a 37 y.o. female with h/o schizophrenia, polysubstance use, presents to the ER today for evaluation of depression. Currently, the patient is non verbal and hard to arouse, but then will awaken randomly and move all extremities. Majority of the history obtained from nursing note. Level 5 caveat given non verbal status.     Depression       Home Medications Prior to Admission medications   Medication Sig Start Date End Date Taking? Authorizing Provider  benztropine (COGENTIN) 0.5 MG tablet Take 1 tablet (0.5 mg total) by mouth 2 (two) times daily. Patient not taking: Reported on 02/17/2023 07/31/22 08/30/22  Park Pope, MD  gabapentin (NEURONTIN) 300 MG capsule Take 1 capsule (300 mg total) by mouth 2 (two) times daily. 03/07/23 03/01/24  Windell Norfolk, MD  haloperidol (HALDOL) 5 MG tablet Take one tablet in the morning and 2 in the evening Patient not taking: Reported on 02/17/2023 07/31/22   Park Pope, MD      Allergies    Bee venom    Review of Systems   Review of Systems  Unable to perform ROS: Patient nonverbal  Psychiatric/Behavioral:  Positive for depression.     Physical Exam Updated Vital Signs BP 106/73 (BP Location: Right Arm)   Pulse 82   Temp 98.6 F (37 C) (Oral)   Resp 14   Ht 5\' 3"  (1.6 m)   Wt 61.2 kg   SpO2 100%   BMI 23.91 kg/m  Physical Exam Vitals and nursing note reviewed.  Constitutional:      Appearance: She is not toxic-appearing.     Comments: Sleeping, awakens occasionally to painful stimuli, but will awake randomly without it and mumble.   HENT:     Mouth/Throat:     Comments: Edentulous, dry Eyes:     General: No scleral icterus. Cardiovascular:     Rate and Rhythm: Normal rate.  Pulmonary:     Effort: Pulmonary effort is  normal. No respiratory distress.  Abdominal:     Palpations: Abdomen is soft.     Tenderness: There is no abdominal tenderness. There is no guarding or rebound.  Skin:    General: Skin is warm and dry.     ED Results / Procedures / Treatments   Labs (all labs ordered are listed, but only abnormal results are displayed) Labs Reviewed  COMPREHENSIVE METABOLIC PANEL - Abnormal; Notable for the following components:      Result Value   Sodium 134 (*)    CO2 14 (*)    Glucose, Bld 60 (*)    Calcium 8.8 (*)    ALT 56 (*)    Alkaline Phosphatase 37 (*)    Total Bilirubin 1.2 (*)    All other components within normal limits  CBG MONITORING, ED - Abnormal; Notable for the following components:   Glucose-Capillary 62 (*)    All other components within normal limits  CBG MONITORING, ED - Abnormal; Notable for the following components:   Glucose-Capillary 167 (*)    All other components within normal limits  ETHANOL  CBC  RAPID URINE DRUG SCREEN, HOSP PERFORMED  HCG, SERUM, QUALITATIVE    EKG None  Radiology DG Chest 2 View  Result Date: 08/13/2023 CLINICAL DATA:  Chest pain. EXAM:  CHEST - 2 VIEW COMPARISON:  Jan 24, 2023. FINDINGS: The heart size and mediastinal contours are within normal limits. Both lungs are clear. The visualized skeletal structures are unremarkable. IMPRESSION: No active cardiopulmonary disease. Electronically Signed   By: Lupita Raider [name]   On: 08/13/2023 12:36    Procedures Procedures   Medications Ordered in ED Medications  dextrose 50 % solution 50 mL (50 mLs Intravenous Given 08/14/23 0149)  sodium chloride 0.9 % bolus 1,000 mL (1,000 mLs Intravenous New Bag/Given 08/14/23 0201)  naloxone (NARCAN) nasal spray 4 mg/0.1 mL (1 spray Nasal Provided for home use 08/14/23 0150)  LORazepam (ATIVAN) injection 2 mg (2 mg Intravenous Given 08/14/23 0201)    ED Course/ Medical Decision Making/ A&P  Medical Decision Making Amount and/or Complexity of  Data Reviewed Labs: ordered.  Risk Prescription drug management.   37 y.o. female presents to the ER for evaluation of depression/psych. Differential diagnosis includes but is not limited to psych/electrolyte imbalance. Vital signs unremarkable. Physical exam as noted above.   On previous chart evaluation, it appears the patient was recently seen for possible code strokelike symptoms.  She was having some nonverbal episodes.  Was also recently seen for nonverbal episodes.  Concern for possible catatonia or conversion syndrome for this.  She does have a history of schizophrenia.  Patient was having limited responsiveness to painful stimuli however would occasionally randomly open her eyes.  Her glucose was at 60, rechecked and it was a 62.  Amp of D50 given and bag of normal saline also.  I also give the patient some Narcan just in case she took any medication while waiting in the waiting room.  She does have a history of polysubstance use however now the UDS is resulted as negative. I see that the patient has improved with Ativan before, will order.   The patient improved significantly with Ativan. She is now awake and conversational, however mumbles. I am able to get out with yes and nos that the patient is feeling depressed with auditory and visual hallucinations. She mentioned something about "they have eyes". She shakes her head no when asking SI or HI. She denies any EtOH or drug use.   I independently reviewed and interpreted the patient's labs.  CMP 134, bicarb of 14 with a normal anion gap.  Glucose at 60.  Calcium 8.8.  Mildly elevated ALT and alk phos with a total bili of 1.2.  UDS is negative.  Ethanol undetectable.  hCG negative.  CBC with a leukocytosis or anemia.  She recently had a CT and MRI of head with similar presentation, I do not think any additional imaging needs to be done. I'm concerned given the patient's presentation and think that she needs to consult TTS for possible  inpatient evaluation. CBGs have improved.   Patient is medically clear for TTS evaluation.   Portions of this report may have been transcribed using voice recognition software. Every effort was made to ensure accuracy; however, inadvertent computerized transcription errors may be present.    Final Clinical Impression(s) / ED Diagnoses Final diagnoses:  None    Rx / DC Orders ED Discharge Orders     None         Achille Rich, PA-C 08/14/23 0254    Gilda Crease, MD 08/15/23 [phone]

## 2023-08-14 NOTE — Progress Notes (Signed)
[facility] contacted this CSW to inform based on some medical concerns they can no longer accept patient. CSW will continue to accept. CSW will continue to assist with placement   Emily Briena Swingler LCSW-A   08/14/2023 1:14 PM

## 2023-08-14 NOTE — Progress Notes (Signed)
Pt has been accepted to [facility] on 08/14/2023  Bed assignment: [address]    Pt meets inpatient criteria per: Rockney Ghee, NP.   Attending Physician will be: Jannifer Franklin MD  Report can be called to: [phone]  Pt can arrive after facility confirms BP/ Pulse regulation.    Care Team Notified: [facility]  Eligha Bridegroom NP, Yancey Flemings RN, Dorene Grebe RN    [place] Terease Marcotte LCSW-A   08/14/2023 12:43 PM

## 2023-08-16 LAB — URINE CULTURE

## 2023-11-21 IMAGING — CT CT HEAD W/O CM
4 series · 17 of 47 positions shown, 19 images · non-contrast
Comparison: CT head 10/26/2021

CLINICAL DATA: Delirium



[Series 3: head w o · axial · 0.47mm/px · z∈[+225,+345]mm · 7 of 33 slices shown, 9 images]
[im 5/33  brain]
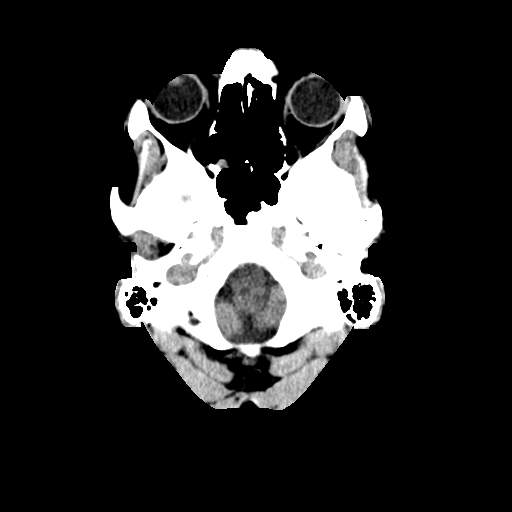
[im 5/33  bone]
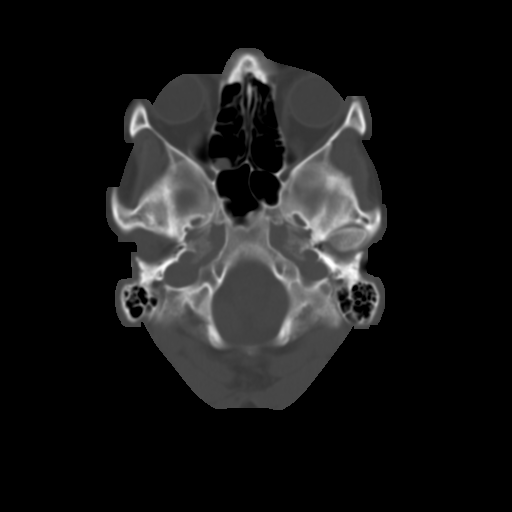
[im 9/33  brain]
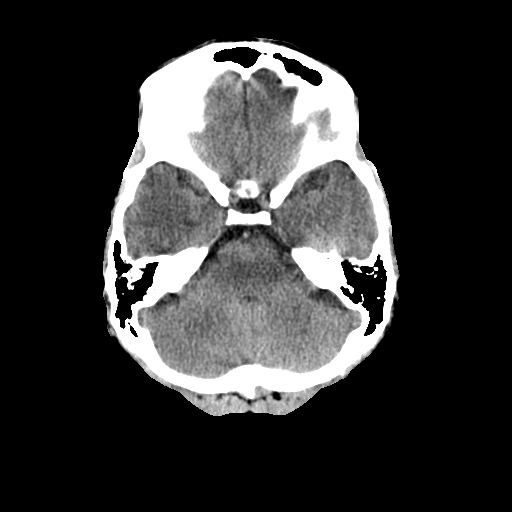
[im 13/33  brain]
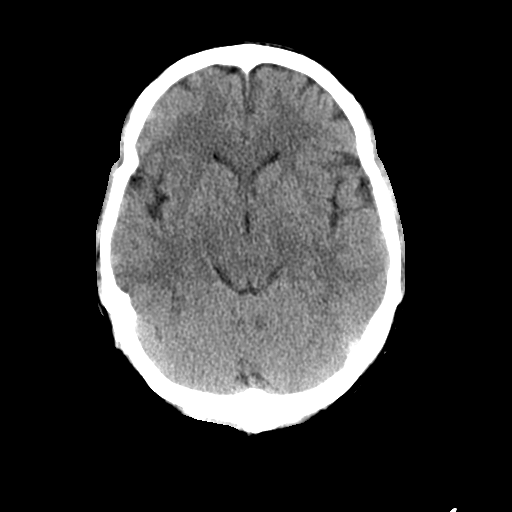
[im 17/33  brain]
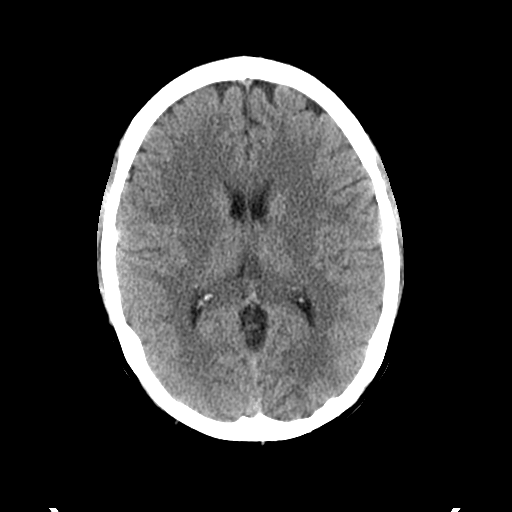
[im 21/33  brain]
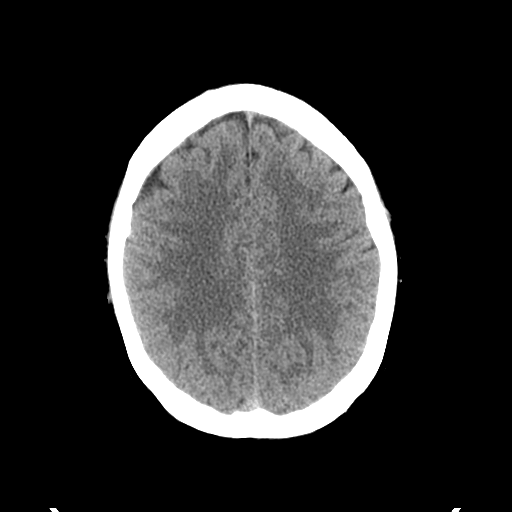
[im 21/33  bone]
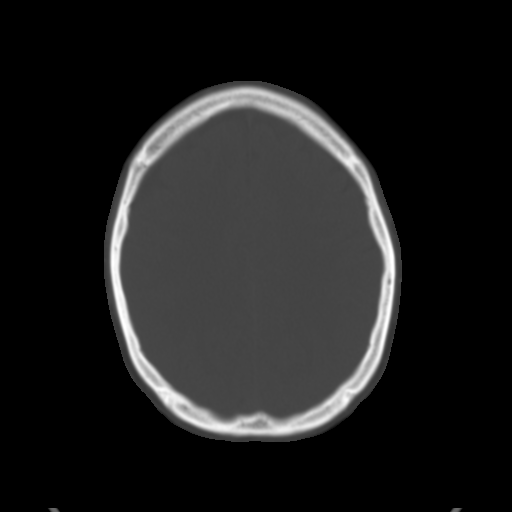
[im 25/33  brain]
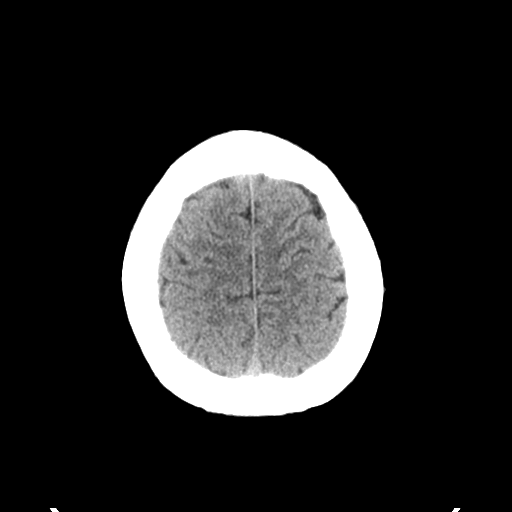
[im 29/33  brain]
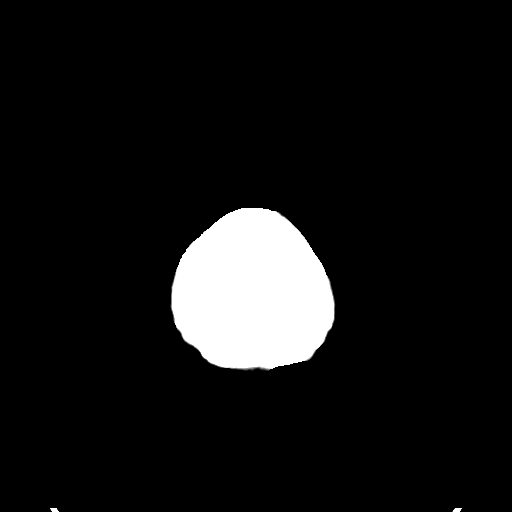

[Series 4: head bone · axial · 0.47mm/px · z∈[+221,+277]mm · 4 of 81 slices shown]
[im 9/81  bone]
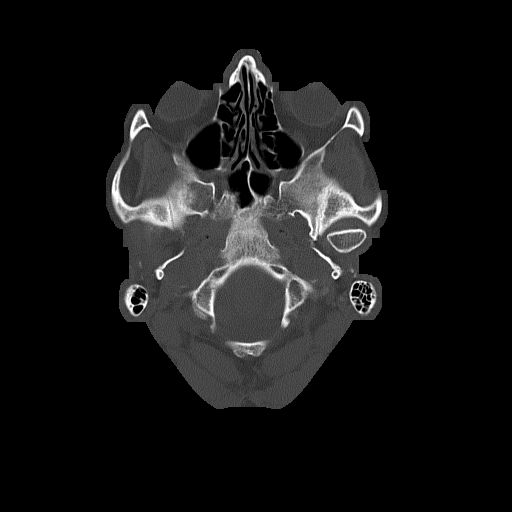
[im 17/81  bone]
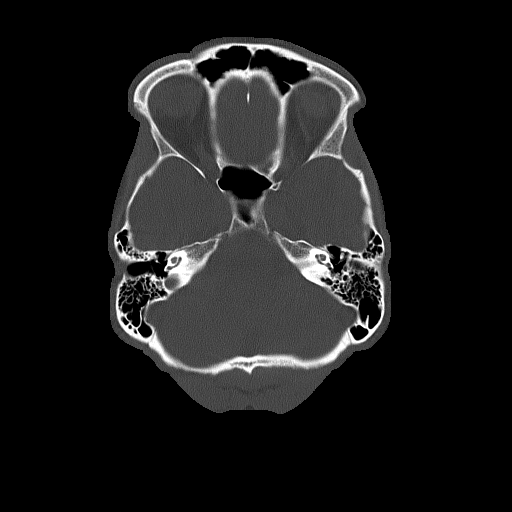
[im 25/81  bone]
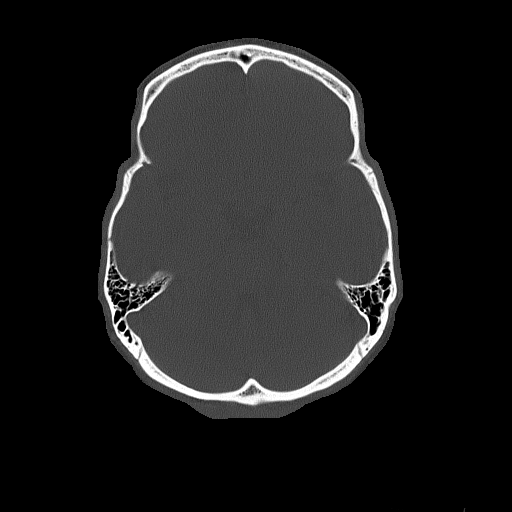
[im 37/81  bone]
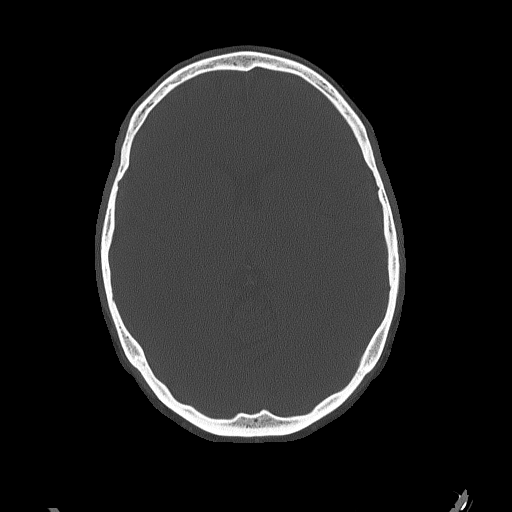

[Series 5: coronal soft · coronal · 0.39mm/px · 3 of 78 slices shown]
[im 26/78  brain]
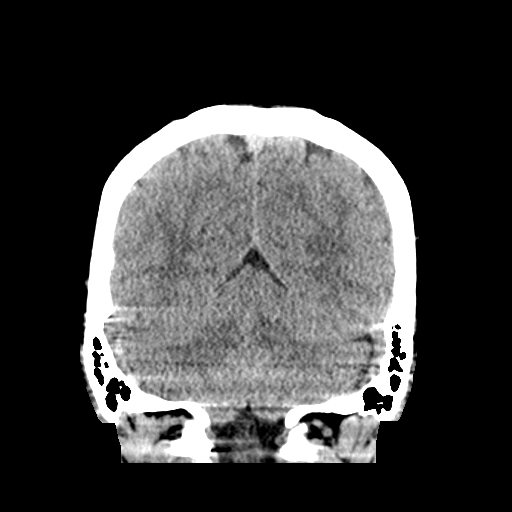
[im 35/78  brain]
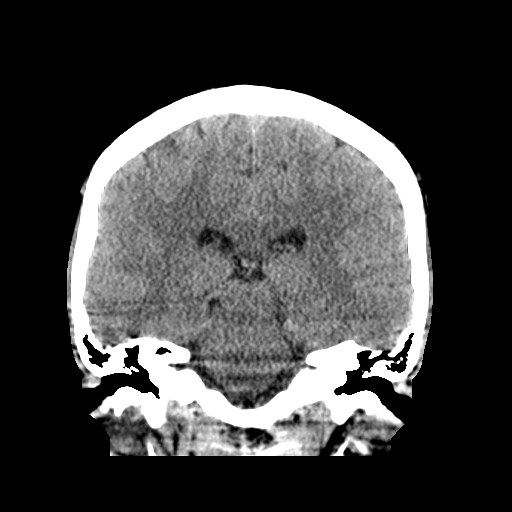
[im 43/78  brain]
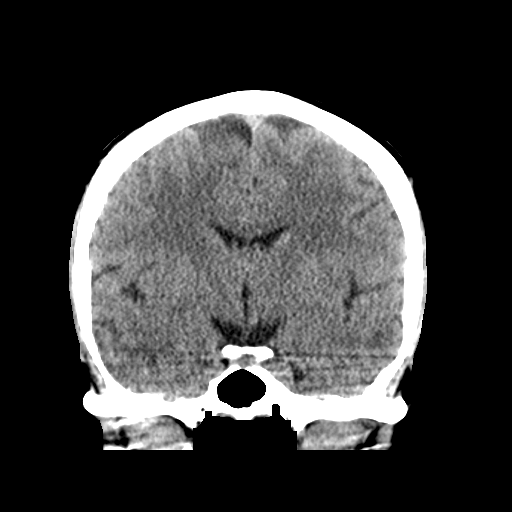

[Series 6: sagittal soft · sagittal · 0.37mm/px · 3 of 60 slices shown]
[im 20/60  brain]
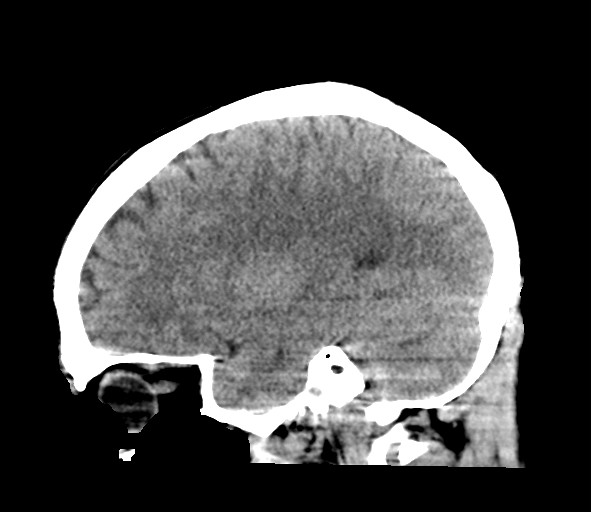
[im 30/60  brain]
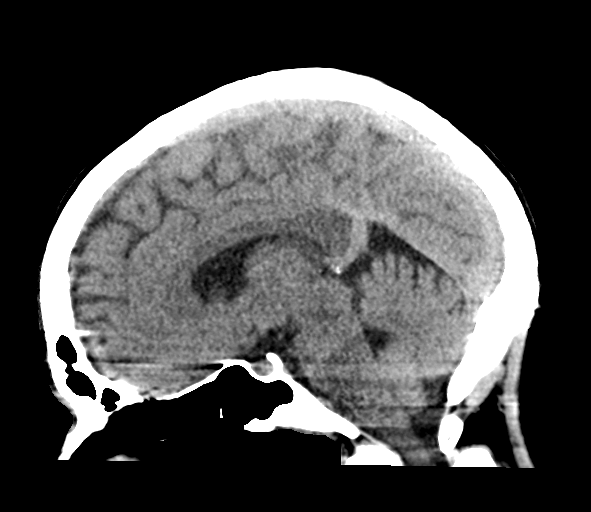
[im 40/60  brain]
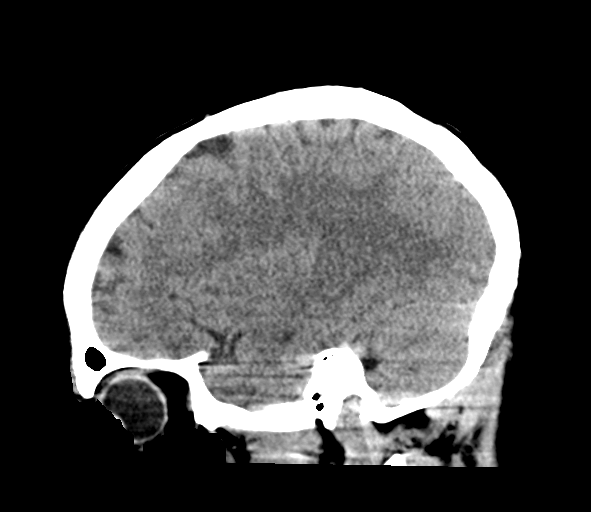

[17 of 47 positions shown; findings below may reference images not displayed]

FINDINGS: Brain: No acute intracranial hemorrhage, mass effect, or herniation.
No extra-axial fluid collections. No evidence of acute territorial
infarct. No hydrocephalus.

Vascular: No hyperdense vessel or unexpected calcification.

Skull: Normal. Negative for fracture or focal lesion.

Sinuses/Orbits: No acute finding.

Other: None.
IMPRESSION: No acute intracranial process identified.

## 2024-05-05 ENCOUNTER — Telehealth: Payer: Self-pay | Admitting: Neurology

## 2024-05-05 NOTE — Telephone Encounter (Signed)
 Call to dale freeman, no answer. No answer, left message to follow up with pysch MD.

## 2024-05-05 NOTE — Telephone Encounter (Signed)
Agree with above recs. Thanks

## 2024-05-05 NOTE — Telephone Encounter (Signed)
 Patient's boyfriend, Cheryl Meissner said need an appointment to see Dr. Gregg, patient would not elaborate on reason for appointment. Informed would send a message to the nurse for to go in to more details. Informed him would need to speak with patient when nurse calls back Informed would need to know the reason for

## 2024-05-05 NOTE — Telephone Encounter (Signed)
 Call to patient boyfriend, dale, he states that he never received EEG result so I reviewed those. He also states patient still has jerks in sleep sometimes maybe once a month but he is not good historian and hard to understand. Reviewed chart and patient diagnosed with schizophrenia, anxiety, and depression. He says it's been a while since she has seen them but he will make her an appointment. I reviewed that she could be having episodes brought on by her anxiety and schizophrenia but would send to Dr. Gregg for review and reach back out. Cheryl was appreciative of call.

## 2024-08-20 ENCOUNTER — Other Ambulatory Visit: Payer: Self-pay | Admitting: Neurology
